# Patient Record
Sex: Male | Born: 1960 | Race: White | Hispanic: No | Marital: Married | Smoking: Former smoker
Health system: Southern US, Community
[De-identification: ages and names within clinical notes are randomized; demographics above are authoritative.]

## PROBLEM LIST (undated history)

## (undated) DIAGNOSIS — E7800 Pure hypercholesterolemia, unspecified: Secondary | ICD-10-CM

## (undated) DIAGNOSIS — F419 Anxiety disorder, unspecified: Secondary | ICD-10-CM

## (undated) DIAGNOSIS — E119 Type 2 diabetes mellitus without complications: Secondary | ICD-10-CM

## (undated) HISTORY — DX: Anxiety disorder, unspecified: F41.9

## (undated) HISTORY — PX: OTHER SURGICAL HISTORY: SHX169

## (undated) HISTORY — DX: Pure hypercholesterolemia, unspecified: E78.00

---
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
## 2002-08-15 ENCOUNTER — Emergency Department (HOSPITAL_COMMUNITY): Admission: EM | Admit: 2002-08-15 | Discharge: 2002-08-15 | Payer: Self-pay | Admitting: Emergency Medicine

## 2002-08-15 ENCOUNTER — Encounter: Payer: Self-pay | Admitting: Emergency Medicine

## 2012-06-07 ENCOUNTER — Telehealth: Payer: Self-pay | Admitting: Pulmonary Disease

## 2012-06-07 NOTE — Telephone Encounter (Signed)
Will hold message and ask sn on Monday---

## 2012-06-08 NOTE — Telephone Encounter (Signed)
Per SN: ok for cpx.  Have him bring any records he may have from other offices.  Thanks.

## 2012-06-08 NOTE — Telephone Encounter (Signed)
Pt's wife [name] returned call.  Advised SN ok with accepting pt.  Per pt's wife, he has not seen another physician since seeing SN "all those years ago."  CPX scheduled for 9.9.13 @ 1130; pt wife aware pt needs to be fasting.  Mrs Boehle asked if a consult sheet is needed - per TD, no.  Paper chart ordered for SN to review.

## 2012-06-08 NOTE — Telephone Encounter (Signed)
Complete phone number to reach spouse is [phone]  Ext [phone]. LMOMTCB x 1.

## 2012-07-23 ENCOUNTER — Encounter: Payer: Self-pay | Admitting: Pulmonary Disease

## 2013-01-14 ENCOUNTER — Encounter: Payer: Self-pay | Admitting: Pulmonary Disease

## 2013-01-28 ENCOUNTER — Ambulatory Visit (INDEPENDENT_AMBULATORY_CARE_PROVIDER_SITE_OTHER): Payer: BC Managed Care – PPO | Admitting: Pulmonary Disease

## 2013-01-28 ENCOUNTER — Other Ambulatory Visit (INDEPENDENT_AMBULATORY_CARE_PROVIDER_SITE_OTHER): Payer: BC Managed Care – PPO

## 2013-01-28 ENCOUNTER — Ambulatory Visit (INDEPENDENT_AMBULATORY_CARE_PROVIDER_SITE_OTHER)
Admission: RE | Admit: 2013-01-28 | Discharge: 2013-01-28 | Disposition: A | Discharge status: Home / Self Care | Payer: BC Managed Care – PPO | Source: Ambulatory Visit | Attending: Pulmonary Disease | Admitting: Pulmonary Disease

## 2013-01-28 ENCOUNTER — Encounter: Payer: Self-pay | Admitting: Pulmonary Disease

## 2013-01-28 VITALS — BP 130/92 | HR 97 | Temp 97.9°F | Ht 69.0 in | Wt 261.4 lb

## 2013-01-28 DIAGNOSIS — M65979 Unspecified synovitis and tenosynovitis, unspecified ankle and foot: Secondary | ICD-10-CM

## 2013-01-28 DIAGNOSIS — M7750 Other enthesopathy of unspecified foot: Secondary | ICD-10-CM

## 2013-01-28 DIAGNOSIS — Z0000 Encounter for general adult medical examination without abnormal findings: Secondary | ICD-10-CM

## 2013-01-28 DIAGNOSIS — M659 Synovitis and tenosynovitis, unspecified: Secondary | ICD-10-CM

## 2013-01-28 DIAGNOSIS — E663 Overweight: Secondary | ICD-10-CM

## 2013-01-28 DIAGNOSIS — Z87442 Personal history of urinary calculi: Secondary | ICD-10-CM | POA: Insufficient documentation

## 2013-01-28 LAB — HEPATIC FUNCTION PANEL
ALT: 65 U/L — ABNORMAL HIGH (ref 0–53)
Albumin: 4.3 g/dL (ref 3.5–5.2)
Alkaline Phosphatase: 69 U/L (ref 39–117)
Bilirubin, Direct: 0.1 mg/dL (ref 0.0–0.3)
Total Protein: 7 g/dL (ref 6.0–8.3)

## 2013-01-28 LAB — URINALYSIS
Nitrite: NEGATIVE
Specific Gravity, Urine: 1.015 (ref 1.000–1.030)
Total Protein, Urine: NEGATIVE
Urine Glucose: NEGATIVE
pH: 7 (ref 5.0–8.0)

## 2013-01-28 LAB — CBC WITH DIFFERENTIAL/PLATELET
Basophils Absolute: 0 10*3/uL (ref 0.0–0.1)
Eosinophils Absolute: 0.2 10*3/uL (ref 0.0–0.7)
Lymphocytes Relative: 32.9 % (ref 12.0–46.0)
MCHC: 33.4 g/dL (ref 30.0–36.0)
Monocytes Relative: 8.6 % (ref 3.0–12.0)
Neutrophils Relative %: 56 % (ref 43.0–77.0)
RBC: 4.93 Mil/uL (ref 4.22–5.81)
RDW: 13.9 % (ref 11.5–14.6)

## 2013-01-28 LAB — TSH: TSH: 0.72 u[IU]/mL (ref 0.35–5.50)

## 2013-01-28 LAB — BASIC METABOLIC PANEL
CO2: 27 mEq/L (ref 19–32)
Calcium: 9.7 mg/dL (ref 8.4–10.5)
Chloride: 100 mEq/L (ref 96–112)
Creatinine, Ser: 0.8 mg/dL (ref 0.4–1.5)
Glucose, Bld: 104 mg/dL — ABNORMAL HIGH (ref 70–99)

## 2013-01-28 LAB — LIPID PANEL
Cholesterol: 230 mg/dL — ABNORMAL HIGH (ref 0–200)
Total CHOL/HDL Ratio: 5
Triglycerides: 146 mg/dL (ref 0.0–149.0)

## 2013-01-28 LAB — PSA: PSA: 0.87 ng/mL (ref 0.10–4.00)

## 2013-01-28 LAB — LDL CHOLESTEROL, DIRECT: Direct LDL: 153.4 mg/dL

## 2013-01-28 NOTE — Patient Instructions (Addendum)
Jacob Acosta, it was nice seeing you again...  Today we did your baseline CXR, EKG, & FASTING blood work...    We will contact you w/ the results when available...   Let's get on track w/ our diet & exercise plan...    The goal is to lose that 1st 15-20 lbs!!!  Call for any questions...  Let's plan a brief follow up visit in 6 months to check your progress.Marland KitchenMarland Kitchen

## 2013-01-28 NOTE — Progress Notes (Signed)
Subjective:     Patient ID: Jacob Acosta, male   DOB: Sep 07, 1961, 52 y.o.   MRN: [id number]  HPI 52 y/o WM, brother of Jacob Acosta, here to establish general medical care & CPX...  ~  January 28, 2013:  Jacob Acosta is 52 y/o & has not seen a physician in >21yrs;  He is the bro of Jacob Acosta who used to work in our office/ CVTS office/ etc & passed away last yr w/ a sudden MI/ arrhythmia;  Jacob Acosta denies medical problems & is here for a CPX, initial assessment & problem list formulation: CXR 3/14 showed normal heart size, clear lungs, WNL/ NAD.Marland KitchenMarland Kitchen EKG 3/14 showed NSR, rate71, WNL, NAD... LABS 3/14:  FLP- not at goals on diet alone- start Simva40;  Chems- ok x BS=104 & SGPT=65 (Rec- low carb, low fat);  CBC- wnl;  TSH=0.72;  PSA=0.87;  UA- clear.          PROBLEM LIST:    PAROTID HYPERTROPHY >> he has bilat parotid hypertrophy on exam; denies prob w/ dry mouth, never had stone in ducts, etc; Rec to use sialogogues, lozenges, etc.  FamHx Heart Disease >> as noted his sister died suddenly at age 25 from a cardiac arrhythmia, no known cardiac dis prior to that; Father died at 18 of pulm embolism after pancreatic surg; Mother died at 49 w/ Alzheimer's disease...  HYPERLIPIDEMIA >> rec to start SIMVASTATIN 40mg /d... ~  FLP 3/14 showed TChol 230, TG 146, HDL 43, LDL 154... We discussed diet, exercise, wt reduction, & start Simva40.  OBESITY >> we discussed low carb. Low fat diet 7 weight reduction strategies... ~  Mar 28, 1999:  He states he weighed ~200# in the late 1990's tp 03/28/1999... ~  03/27/2009:  He notes that he weighed around 230-245# in the late 2000's to 2009/03/27... ~  3/14:  He weighs 261#, height= 69 inches, BMI= 39; we reviewed diet/ exercise/ wt reduction strategies... NOTE> labs w/ elev SGPT=65 & advised no hepatotoxins & get wt down...  Hx KIDNEY STONES >> He reports an episode in 2002/03/27 diagnosed as a kidney stone, which he was able to pass on his own & there has not been any recurrence...  TENDONITIS >> He  reports tendonitis in his left foot & ankle that he treats w/ Aleve prn; he prev saw Ortho at the [facility] clinic, was given a boot but he only used this for a short time, and has not had a repeat eval...   History reviewed. No pertinent past medical history.   Past Surgical History  Procedure Laterality Date  . Tonsillectomy  1980  . Dental sugery      at age 37  He states that he had all 4 wisdom teeth removed 27-Mar-1981...    History reviewed. No pertinent family history. Father died age 72 from a pulm embolism that occurred after pancreas surg (Hx Etoh, pancreatitis)... Mother died age 81 w/ alzheimer's dis; she had hx dermatomyositis eval & rx at [facility]; ?high cholesterol but no known heart disease... One sibling- sister, Jacob Acosta, died suddenly in 03/27/2012 at age 42 from a cardiac arrhythmia- likely underlying coronary dis but unknown...   History   Social History  . Marital Status: Married    Spouse Name: Jacob Acosta    Number of Children: 0  . Years of Education: N/A   Occupational History  . drives delivery truck    Social History Main Topics  . Smoking status: Light Tobacco Smoker  Types: Cigarettes  . Smokeless tobacco: Not on file     Comment: only smoked occasionally  . Alcohol Use: No  . Drug Use: No  . Sexually Active: Not on file   Other Topics Concern  . Not on file   Social History Narrative  . No narrative on file  Never a regular smoker> none x 10 yrs he says... Etoh> Quit ~12 yrs ago   Outpatient Encounter Prescriptions as of 01/28/2013  Medication Sig Dispense Refill  . aspirin 81 MG tablet Take 81 mg by mouth daily.      . naproxen sodium (ANAPROX) 220 MG tablet Take 220 mg by mouth 2 (two) times daily with a meal.       No facility-administered encounter medications on file as of 01/28/2013.    Allergies  Allergen Reactions  . Codeine     Causes him to feel very anxious    Review of Systems    Constitutional:  Denies F/C/S,  anorexia, unexpected weight change. HEENT:  No HA, visual changes, earache, nasal symptoms, sore throat, hoarseness. Resp:  No cough, sputum, hemoptysis; no SOB, tightness, wheezing. Cardio:  No CP, palpit, DOE, orthopnea, edema. GI:  Denies N/V/D/C or blood in stool; no reflux, abd pain, distention, or gas. GU:  No dysuria, freq, urgency, hematuria, or flank pain. MS:  Occas left ankle discomfort; no swelling, tenderness, or decr ROM; no neck pain, back pain, etc. Neuro:  No tremors, seizures, dizziness, syncope, weakness, numbness, gait abn. Skin:  No suspicious lesions or skin rash. Heme:  No adenopathy, bruising, bleeding. Psyche: Denies confusion, sleep disturbance, hallucinations, anxiety, depression.   Objective:   Physical Exam    Vital Signs:  Reviewed...  General:  WD, Overweight, 52 y/o WM in NAD; alert & oriented; pleasant & cooperative... HEENT:  [place]/AT; Conjunctiva- pink, Sclera- nonicteric, EOM-wnl, PERRLA, Fundi-benign; EACs-clear, TMs-wnl; NOSE-clear; THROAT-clear & wnl; +Bilat parotid hypertrophy. Neck:  Supple w/ full ROM; no JVD; normal carotid impulses w/o bruits; no thyromegaly or nodules palpated; no lymphadenopathy. Chest:  Clear to P & A; without wheezes, rales, or rhonchi heard. Heart:  Regular Rhythm; norm S1 & S2 without murmurs, rubs, or gallops detected. Abdomen:  Soft & nontender- no guarding or rebound; normal bowel sounds; no organomegaly or masses palpated. Ext:  Normal ROM; without deformities or arthritic changes; no varicose veins, venous insuffic, or edema;  Pulses intact w/o bruits. Neuro:  CNs II-XII intact; motor testing normal; sensory testing normal; gait normal & balance OK. Derm:  No lesions noted; no rash etc. Lymph:  No cervical, supraclavicular, axillary, or inguinal adenopathy palpated.   Assessment:     Physical Exam >>  Hyperlipidemia>  1st FLP is off & needs meds w/ the hope of coming off if he loses the weight; Rec- start Simva40 &  recheck labs.  Obesity>  We reviewed diet, exercise, wt reduction strategies...  FamHx heart dis in sister>  We reviewed Risk Factors and risk factor reduction strategy...  Hx kidney stone>  Occurred years ago 7 no known recurrence; advised on fluid intake   Hx tendonitis>  He uses OTC Aleve w/ relief, nothing further required at this point...  Parotid hypertrophy>  He is asymptomatic, advised on use of sialogogues, lozenges, etc...     Plan:     Patient's Medications  New Prescriptions  SIMVASTATIN 40mg  - take one tab po Qhs...  Previous Medications   ASPIRIN 81 MG TABLET    Take 81 mg by mouth daily.   NAPROXEN SODIUM (ANAPROX) 220 MG TABLET    Take 220 mg by mouth 2 (two) times daily with a meal.  Modified Medications   No medications on file  Discontinued Medications   No medications on file

## 2013-01-30 ENCOUNTER — Other Ambulatory Visit: Payer: Self-pay | Admitting: Pulmonary Disease

## 2013-01-30 DIAGNOSIS — E7800 Pure hypercholesterolemia, unspecified: Secondary | ICD-10-CM

## 2013-01-30 MED ORDER — SIMVASTATIN 40 MG PO TABS: 40.0000 mg | ORAL_TABLET | Freq: Every day | ORAL | Status: DC

## 2013-08-02 ENCOUNTER — Encounter: Payer: Self-pay | Admitting: Pulmonary Disease

## 2013-08-02 ENCOUNTER — Ambulatory Visit: Payer: BC Managed Care – PPO | Admitting: Pulmonary Disease

## 2013-08-02 NOTE — Progress Notes (Deleted)
Subjective:     Patient ID: Jacob Acosta, male   DOB: 07-16-1961, 53 y.o.   MRN: [id number]  HPI  29 y/o WM, brother of Jacob Acosta, here to establish general medical care & CPX...  ~  January 28, 2013:  Jacob Acosta is 52 y/o & has not seen a physician in >54yrs;  He is the bro of Jacob Acosta who used to work in our office/ CVTS office/ etc & passed away last yr w/ a sudden MI/ arrhythmia;  Jacob Acosta denies medical problems & is here for a CPX, initial assessment & problem list formulation: CXR 3/14 showed normal heart size, clear lungs, WNL/ NAD.Marland KitchenMarland Kitchen EKG 3/14 showed NSR, rate71, WNL, NAD... LABS 3/14:  FLP- not at goals on diet alone- start Simva40;  Chems- ok x BS=104 & SGPT=65 (Rec- low carb, low fat);  CBC- wnl;  TSH=0.72;  PSA=0.87;  UA- clear.   ~  August 02, 2013:  89mo ROV           PROBLEM LIST:    PAROTID HYPERTROPHY >> he has bilat parotid hypertrophy on exam; denies prob w/ dry mouth, never had stone in ducts, etc; Rec to use sialogogues, lozenges, etc.  FamHx Heart Disease >> as noted his sister died suddenly at age 15 from a cardiac arrhythmia, no known cardiac dis prior to that; Father died at 14 of pulm embolism after pancreatic surg; Mother died at 20 w/ Alzheimer's disease...  HYPERLIPIDEMIA >> rec to start SIMVASTATIN 40mg /d... ~  FLP 3/14 showed TChol 230, TG 146, HDL 43, LDL 154... We discussed diet, exercise, wt reduction, & start Simva40.  OBESITY >> we discussed low carb. Low fat diet 7 weight reduction strategies... ~  04/02/99:  He states he weighed ~200# in the late 1990's tp Apr 02, 1999... ~  04/01/2009:  He notes that he weighed around 230-245# in the late 2000's to Apr 01, 2009... ~  3/14:  He weighs 261#, height= 69 inches, BMI= 39; we reviewed diet/ exercise/ wt reduction strategies... NOTE> labs w/ elev SGPT=65 & advised no hepatotoxins & get wt down...  Hx KIDNEY STONES >> He reports an episode in Apr 01, 2002 diagnosed as a kidney stone, which he was able to pass on his own & there has not been  any recurrence...  TENDONITIS >> He reports tendonitis in his left foot & ankle that he treats w/ Aleve prn; he prev saw Ortho at the [facility] clinic, was given a boot but he only used this for a short time, and has not had a repeat eval...   History reviewed. No pertinent past medical history.   Past Surgical History  Procedure Laterality Date  . Tonsillectomy  1980  . Dental sugery      at age 74  He states that he had all 4 wisdom teeth removed 1981/04/01...    History reviewed. No pertinent family history. Father died age 13 from a pulm embolism that occurred after pancreas surg (Hx Etoh, pancreatitis)... Mother died age 28 w/ alzheimer's dis; she had hx dermatomyositis eval & rx at [facility]; ?high cholesterol but no known heart disease... One sibling- sister, Jacob Acosta, died suddenly in 01-Apr-2012 at age 42 from a cardiac arrhythmia- likely underlying coronary dis but unknown...   History   Social History  . Marital Status: Married    Spouse Name: Jacob Acosta    Number of Children: 0  . Years of Education: N/A   Occupational History  . drives delivery truck    Social History  Main Topics  . Smoking status: Light Tobacco Smoker    Types: Cigarettes  . Smokeless tobacco: Not on file     Comment: only smoked occasionally  . Alcohol Use: No  . Drug Use: No  . Sexual Activity: Not on file   Other Topics Concern  . Not on file   Social History Narrative  . No narrative on file  Never a regular smoker> none x 10 yrs he says... Etoh> Quit ~12 yrs ago   Outpatient Encounter Prescriptions as of 08/02/2013  Medication Sig Dispense Refill  . aspirin 81 MG tablet Take 81 mg by mouth daily.      . naproxen sodium (ANAPROX) 220 MG tablet Take 220 mg by mouth 2 (two) times daily with a meal.      . simvastatin (ZOCOR) 40 MG tablet Take 1 tablet (40 mg total) by mouth at bedtime.  90 tablet  3   No facility-administered encounter medications on file as of 08/02/2013.     Allergies  Allergen Reactions  . Codeine     Causes him to feel very anxious    Review of Systems    Constitutional:  Denies F/C/S, anorexia, unexpected weight change. HEENT:  No HA, visual changes, earache, nasal symptoms, sore throat, hoarseness. Resp:  No cough, sputum, hemoptysis; no SOB, tightness, wheezing. Cardio:  No CP, palpit, DOE, orthopnea, edema. GI:  Denies N/V/D/C or blood in stool; no reflux, abd pain, distention, or gas. GU:  No dysuria, freq, urgency, hematuria, or flank pain. MS:  Occas left ankle discomfort; no swelling, tenderness, or decr ROM; no neck pain, back pain, etc. Neuro:  No tremors, seizures, dizziness, syncope, weakness, numbness, gait abn. Skin:  No suspicious lesions or skin rash. Heme:  No adenopathy, bruising, bleeding. Psyche: Denies confusion, sleep disturbance, hallucinations, anxiety, depression.   Objective:   Physical Exam    Vital Signs:  Reviewed...  General:  WD, Overweight, 52 y/o WM in NAD; alert & oriented; pleasant & cooperative... HEENT:  [place]/AT; Conjunctiva- pink, Sclera- nonicteric, EOM-wnl, PERRLA, Fundi-benign; EACs-clear, TMs-wnl; NOSE-clear; THROAT-clear & wnl; +Bilat parotid hypertrophy. Neck:  Supple w/ full ROM; no JVD; normal carotid impulses w/o bruits; no thyromegaly or nodules palpated; no lymphadenopathy. Chest:  Clear to P & A; without wheezes, rales, or rhonchi heard. Heart:  Regular Rhythm; norm S1 & S2 without murmurs, rubs, or gallops detected. Abdomen:  Soft & nontender- no guarding or rebound; normal bowel sounds; no organomegaly or masses palpated. Ext:  Normal ROM; without deformities or arthritic changes; no varicose veins, venous insuffic, or edema;  Pulses intact w/o bruits. Neuro:  CNs II-XII intact; motor testing normal; sensory testing normal; gait normal & balance OK. Derm:  No lesions noted; no rash etc. Lymph:  No cervical, supraclavicular, axillary, or inguinal adenopathy  palpated.   Assessment:     Physical Exam >>  Hyperlipidemia>  1st FLP is off & needs meds w/ the hope of coming off if he loses the weight; Rec- start Simva40 & recheck labs.  Obesity>  We reviewed diet, exercise, wt reduction strategies...  FamHx heart dis in sister>  We reviewed Risk Factors and risk factor reduction strategy...  Hx kidney stone>  Occurred years ago 7 no known recurrence; advised on fluid intake   Hx tendonitis>  He uses OTC Aleve w/ relief, nothing further required at this point...  Parotid hypertrophy>  He is asymptomatic, advised on use of sialogogues, lozenges, etc...     Plan:

## 2013-08-07 ENCOUNTER — Encounter: Payer: Self-pay | Admitting: Pulmonary Disease

## 2014-03-11 IMAGING — CR DG CHEST 2V
2 series · 2 of 2 positions shown · non-contrast
Comparison: None.

CLINICAL DATA: Physical examination.

CHEST - 2 VIEW

[view not recorded (1 of 2)]
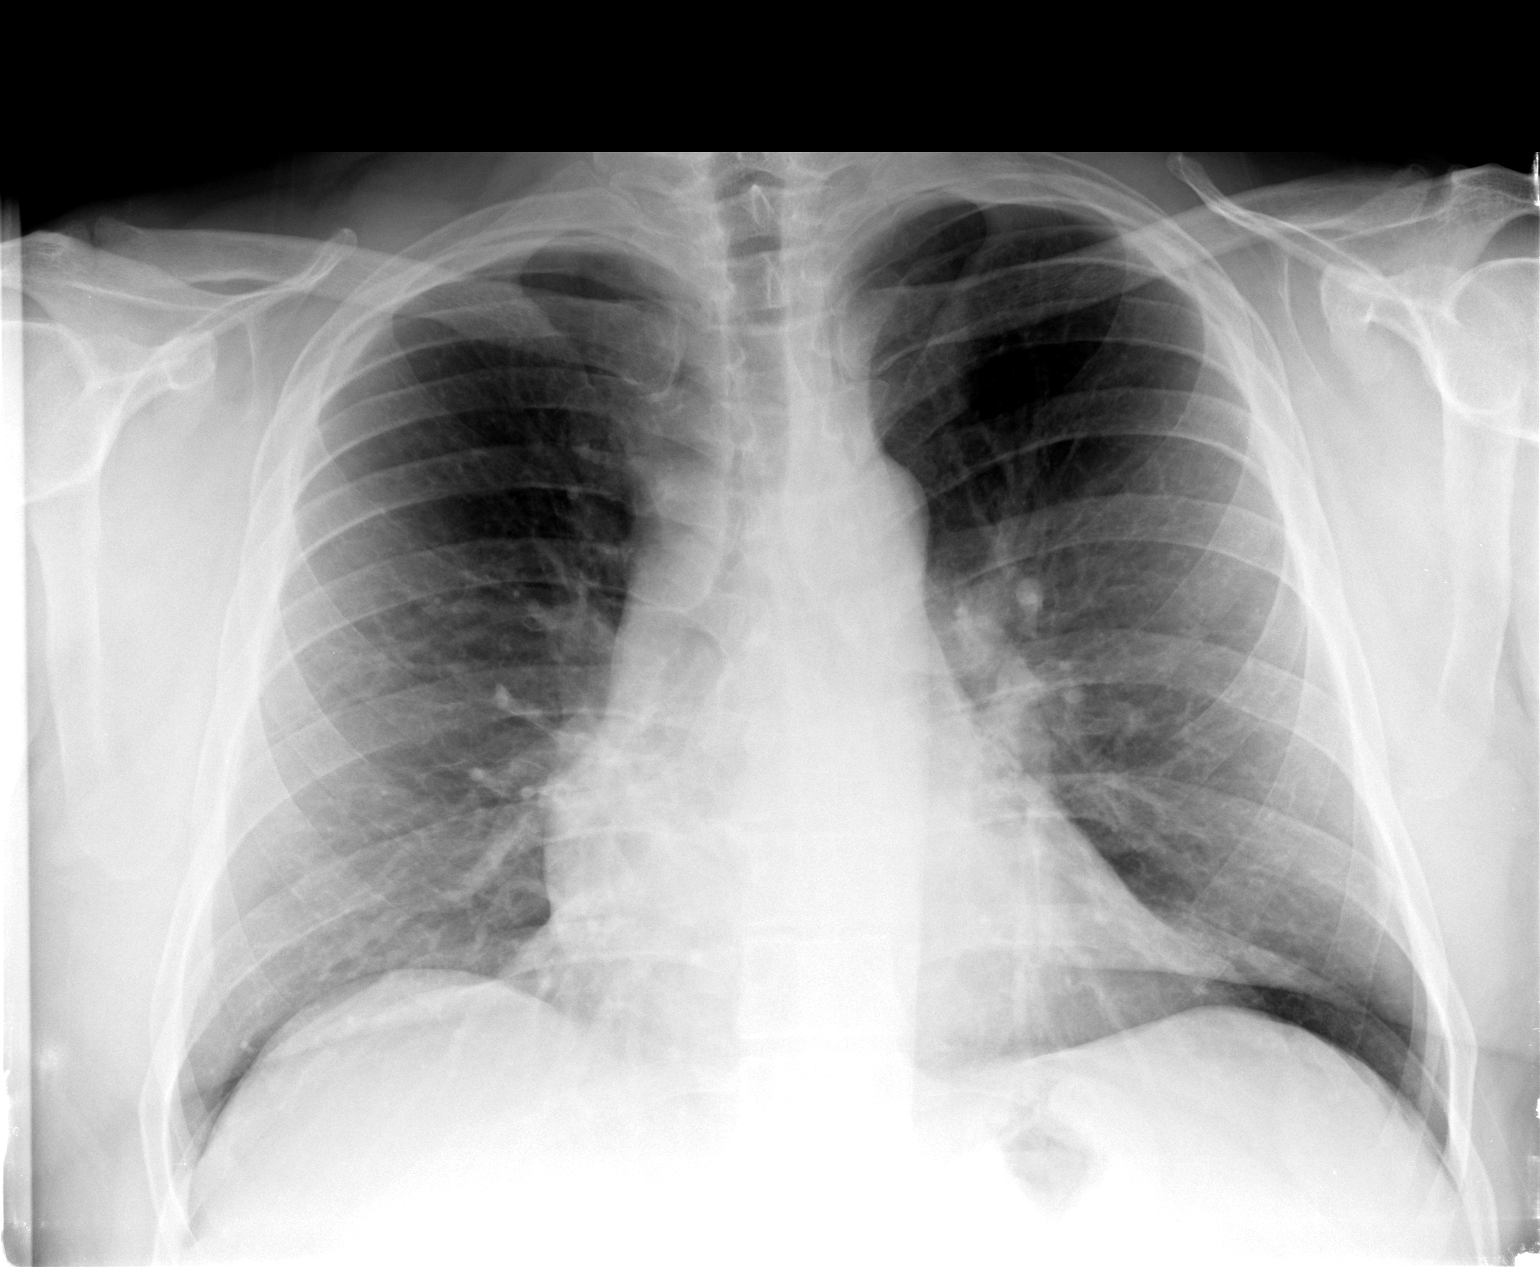

[view not recorded (2 of 2)]
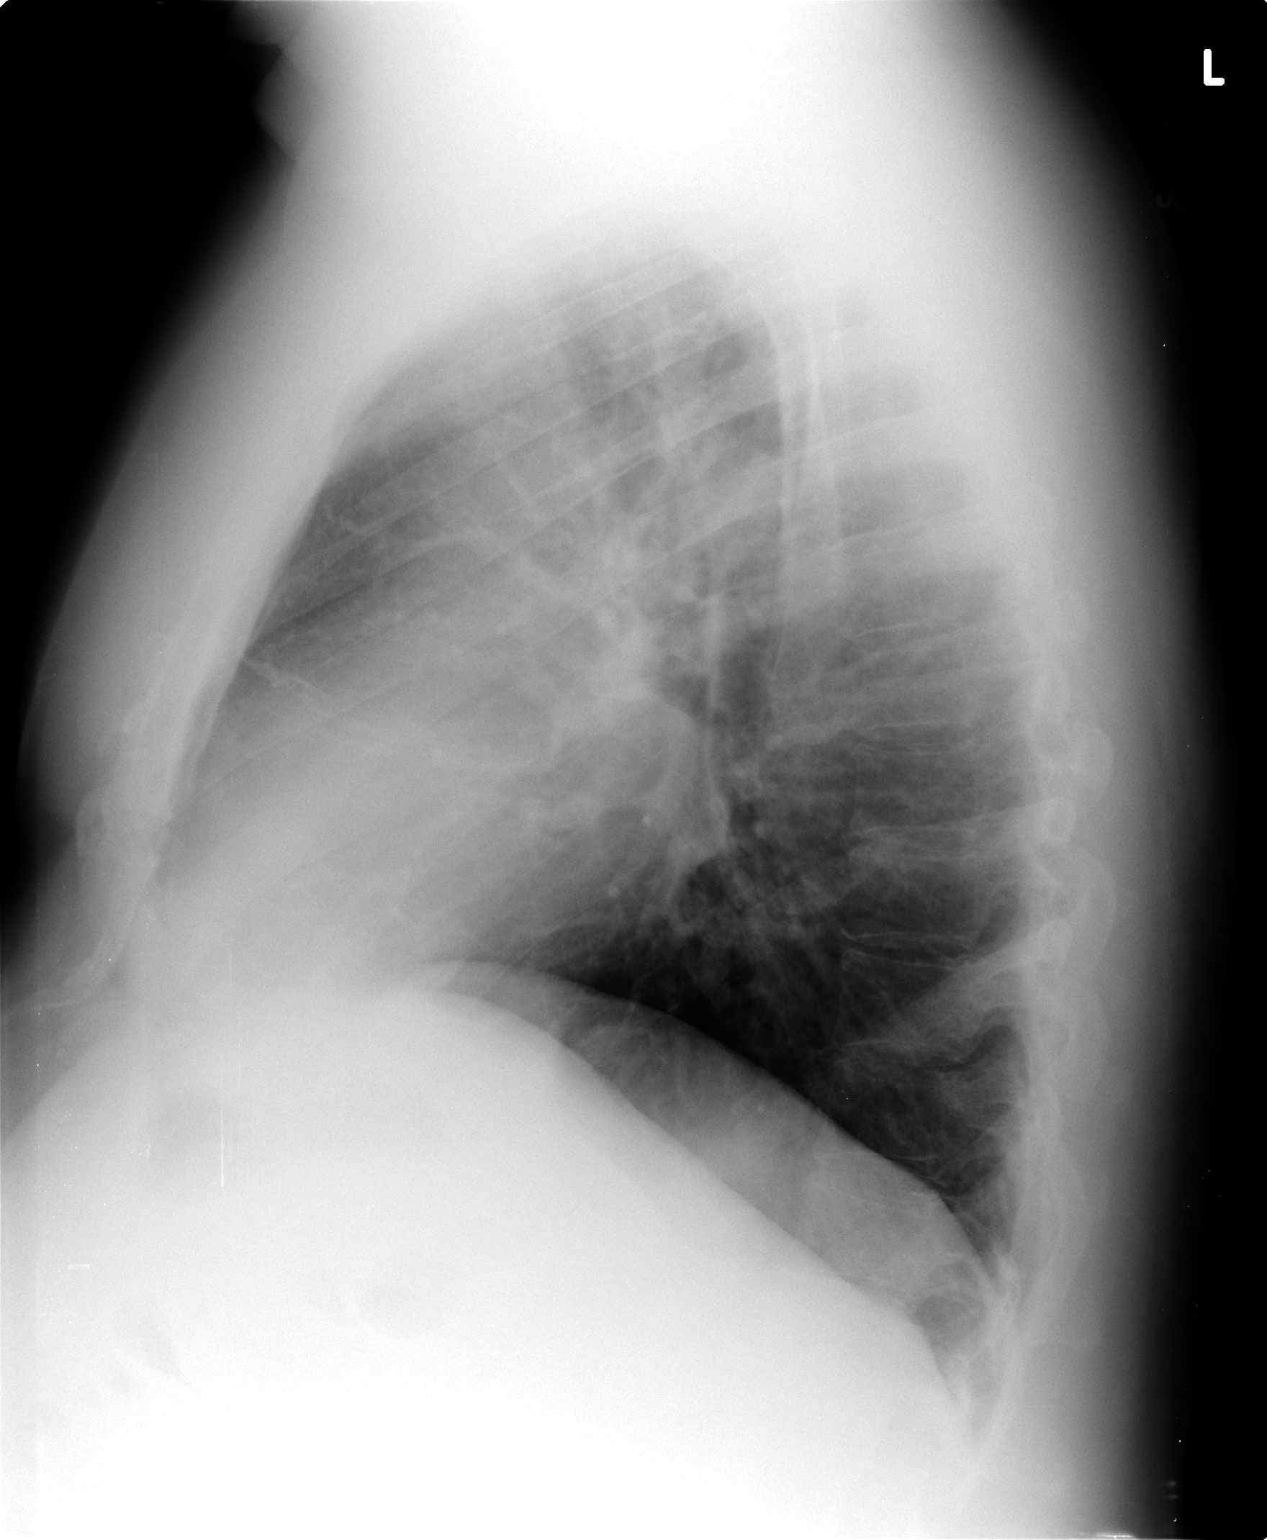

[2 of 2 positions shown; findings below may reference images not displayed]

FINDINGS: Lungs are clear.  Heart size is normal.  No pneumothorax
or pleural effusion.
IMPRESSION: No acute disease.

## 2014-09-29 ENCOUNTER — Ambulatory Visit (HOSPITAL_COMMUNITY): Payer: Self-pay | Admitting: Psychology

## 2014-10-28 ENCOUNTER — Encounter (HOSPITAL_COMMUNITY): Payer: Self-pay | Admitting: Psychology

## 2014-10-28 ENCOUNTER — Ambulatory Visit (INDEPENDENT_AMBULATORY_CARE_PROVIDER_SITE_OTHER): Payer: BC Managed Care – PPO | Admitting: Psychology

## 2014-10-28 DIAGNOSIS — F411 Generalized anxiety disorder: Secondary | ICD-10-CM

## 2014-10-28 NOTE — Progress Notes (Signed)
Patient:   Jacob Acosta   DOB:   03-12-1961  MR Number:  [id number]  [facility]  [facility] [address] [place] [address] Dept: [phone]           Date of Service:   10/28/2014  Start Time:   9 AM End Time:   10:10 AM  Provider/Observer:  Hershal CoriaJohn R Ioannis Schuh PSYD       Billing Code/Service: [id number]  Chief Complaint:     Chief Complaint  Patient presents with  . Anxiety    Reason for Service:  The patient is a self referred 53 year old male who presents reporting Anxiety, dperessive symptoms and excessive worry.  He reports that he has been trying to cope with the sudden death of his sister from a heart attack and the death from his mother at the same time from dementia. The patient reports that this excessive anxiety and worry is causing some difficulties with his cognitive functioning including short-term memory issues that are having a negative impact on his job. The patient reports that this is been going on for about a year but is progressively worsening.  Current Status:  The patient describes moderate to significant symptoms of depression, anxiety, mood changes, appetite disturbance, sleep changes, work issues, insomnia, cognitive issues, loss of interest, excessive worrying, panic attacks, and obsessive thinking.  Reliability of Information: Information is provided by the patient as well as review of available medical records.  Behavioral Observation: Jacob LeschJoe D Nawaz  presents as a 53 y.o.-year-old Right Caucasian Male who appeared his stated age. his dress was Appropriate and he was Well Groomed and his manners were Appropriate to the situation.  There were not any physical disabilities noted.  he displayed an appropriate level of cooperation and motivation.    Interactions:    Active   Attention:   within normal limits  Memory:   within normal limits  Visuo-spatial:   within  normal limits  Speech (Volume):  normal  Speech:   normal pitch  Thought Process:  Coherent  Though Content:  WNL  Orientation:   person, place, time/date and situation  Judgment:   Good  Planning:   Good  Affect:    Anxious  Mood:    Anxious and Depressed  Insight:   Good  Intelligence:   normal  Marital Status/Living: The [place] and [place] up in [place]. He currently lives with his wife. They have no children. The patient's only sibling recently died from a sudden heart attack he spends his leisure time cycling and working out.  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 He has stressed at this job because it is a job that requires a great deal of responsibility and diligence. 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  Past Employment:  The patient has been working in various types of driving a delivery operations.  Substance Use:  No concerns of substance abuse are reported.  the patient denies any substance abuse and does not drink alcohol or use any types of drugs.  Education:   HS Graduate  Medical History:  History reviewed. No pertinent past medical history.      Outpatient Encounter Prescriptions as of 10/28/2014  Medication Sig  . aspirin 81 MG tablet Take 81 mg by  mouth daily.  . naproxen sodium (ANAPROX) 220 MG tablet Take 220 mg by mouth 2 (two) times daily with a meal.  . simvastatin (ZOCOR) 40 MG tablet Take 1 tablet (40 mg total) by mouth at bedtime.          Sexual History:   History  Sexual Activity  . Sexual Activity: Not on file    Abuse/Trauma History: The patient denies any history of abuse or trauma.  Psychiatric History:  Patient denies prior psychiatric history but has been dealing with anxiety and depressive symptoms for over a year now.  Family Med/Psych  History: History reviewed. No pertinent family history.  Risk of Suicide/Violence: virtually non-existent the patient denies any suicidal or homicidal ideation  Impression/DX:  The patient reports increasing symptoms of anxiety and worry. He describes intrusive thinking that center around the loss of his mother and sister but acknowledges that stressors with his job and in particular the fact that this is a third shift job or worsening symptoms. The patient is concerned that he will make a mistake at work which magnified his anxiety.  Disposition/Plan:  We will set the patient for individual psychotherapy as well as looking at the possibility of psychiatric consultation.  The patient also reports difficulty sleeping and excessive snoring we may want to look at the possibility of sleep apnea has an a potential referral for sleep study.  Diagnosis:    Axis I:  Generalized anxiety disorder    Luman Holway R, PsyD 10/28/2014

## 2014-12-04 ENCOUNTER — Telehealth (HOSPITAL_COMMUNITY): Payer: Self-pay | Admitting: *Deleted

## 2014-12-05 ENCOUNTER — Ambulatory Visit (HOSPITAL_COMMUNITY): Payer: Self-pay | Admitting: Psychology

## 2014-12-10 ENCOUNTER — Ambulatory Visit (INDEPENDENT_AMBULATORY_CARE_PROVIDER_SITE_OTHER): Payer: 59 | Admitting: Psychology

## 2014-12-10 DIAGNOSIS — F411 Generalized anxiety disorder: Secondary | ICD-10-CM | POA: Diagnosis not present

## 2015-01-09 ENCOUNTER — Ambulatory Visit (INDEPENDENT_AMBULATORY_CARE_PROVIDER_SITE_OTHER): Payer: 59 | Admitting: Psychology

## 2015-01-09 DIAGNOSIS — F411 Generalized anxiety disorder: Secondary | ICD-10-CM

## 2015-02-02 ENCOUNTER — Ambulatory Visit (INDEPENDENT_AMBULATORY_CARE_PROVIDER_SITE_OTHER): Payer: 59 | Admitting: Psychology

## 2015-02-02 DIAGNOSIS — F411 Generalized anxiety disorder: Secondary | ICD-10-CM | POA: Diagnosis not present

## 2015-03-09 ENCOUNTER — Ambulatory Visit (INDEPENDENT_AMBULATORY_CARE_PROVIDER_SITE_OTHER): Payer: 59 | Admitting: Psychology

## 2015-03-09 ENCOUNTER — Encounter (HOSPITAL_COMMUNITY): Payer: Self-pay | Admitting: Psychology

## 2015-03-09 DIAGNOSIS — F411 Generalized anxiety disorder: Secondary | ICD-10-CM

## 2015-03-09 NOTE — Progress Notes (Signed)
Patient:  Jacob Acosta   DOB: 08-Nov-1961  MR Number: [id number]  [facility] [address] [place] [address] Dept: [phone]  Start: 10 AM End: 11 AM  Provider/Observer:     Hershal CoriaJohn R Lyris Hitchman PSYD  Chief Complaint:      Chief Complaint  Patient presents with  . Anxiety    Reason For Service:     The patient is a self referred [age] year old male who presents reporting Anxiety, dperessive symptoms and excessive worry. He reports that he has been trying to cope with the sudden death of his sister from a heart attack and the death from his mother at the same time from dementia. The patient reports that this excessive anxiety and worry is causing some difficulties with his cognitive functioning including short-term memory issues that are having a negative impact on his job. The patient reports that this is been going on for about a year but is progressively worsening.  Interventions Strategy:  Cognitive/behavioral psychotherapeutic interventions  Participation Level:   Active  Participation Quality:  Appropriate      Behavioral Observation:  Well Groomed, Alert, and Appropriate.   Current Psychosocial Factors: The patient reports that he is continuing to stress a great deal about the loss of his mother and having some flashback type of experiences relating to these death.  The patient reports that he has been looking at changing jobs as this job is been very stressful to him. He is overwhelmed with anxiety and fear and over checking his [place] and fears that something will happen with regard to the loads that he is carrying.  Content of Session:   Reviewed current symptoms and work on therapeutic interventions around building coping skills and strategies.  Current Status:   The patient reports that his symptoms of anxiety have been improving to some degree and has been actively working  on therapeutic interventions we have been developing regarding systematic desensitization of these traumatic losses.  Patient Progress:   Good  Target Goals:   Target goals include reducing intensity, severity, and duration of his anxiety symptoms and building better coping resources.  Last Reviewed:   12/01/2014  Goals Addressed Today:    Today we worked on reducing issues of anxiety and OCD type symptoms.  Impression/Diagnosis:   The patient reports increasing symptoms of anxiety and worry. He describes intrusive thinking that center around the loss of his mother and sister but acknowledges that stressors with his job and in particular the fact that this is a third shift job or worsening symptoms. The patient is concerned that he will make a mistake at work which magnified his anxiety.  Diagnosis:    Axis I: Generalized anxiety disorder

## 2015-03-09 NOTE — Progress Notes (Signed)
Patient:  Jacob Acosta   DOB: 12-26-1960  MR Number: [id number]  [facility] [address] [place] [id number] Dept: [phone]  Start: 10 AM End: 11 AM  Provider/Observer:     Hershal CoriaJohn R Shyheem Whitham PSYD  Chief Complaint:      Chief Complaint  Patient presents with  . Anxiety  . Stress    Reason For Service:     The patient is a self referred 54 year old male who presents reporting Anxiety, dperessive symptoms and excessive worry. He reports that he has been trying to cope with the sudden death of his sister from a heart attack and the death from his mother at the same time from dementia. The patient reports that this excessive anxiety and worry is causing some difficulties with his cognitive functioning including short-term memory issues that are having a negative impact on his job. The patient reports that this is been going on for about a year but is progressively worsening.  Interventions Strategy:  Cognitive/behavioral psychotherapeutic interventions  Participation Level:   Active  Participation Quality:  Appropriate      Behavioral Observation:  Well Groomed, Alert, and Appropriate.   Current Psychosocial Factors: The patient reports that he has been doing much better and trying to exercise more, eat better, and be around others more.  Still looking for good job fit.  Content of Session:   Reviewed current symptoms and work on therapeutic interventions around building coping skills and strategies.  Current Status:   The patient reports that his symptoms of anxiety have been improving  and has been actively working on therapeutic interventions we have been developing regarding systematic desensitization of these traumatic losses.  Reduced frequency of crying spells thinking about loss of sister to once every 3-4 days and last 10 minutes or so.  Patient Progress:   Good  Target  Goals:   Target goals include reducing intensity, severity, and duration of his anxiety symptoms and building better coping resources.  Last Reviewed:   12/01/2014  Goals Addressed Today:    Today we worked on reducing issues of anxiety and OCD type symptoms.  Impression/Diagnosis:   The patient reports increasing symptoms of anxiety and worry. He describes intrusive thinking that center around the loss of his mother and sister but acknowledges that stressors with his job and in particular the fact that this is a third shift job or worsening symptoms. The patient is concerned that he will make a mistake at work which magnified his anxiety.  Diagnosis:    Axis I: Generalized anxiety disorder    Latrelle Fuston R, PsyD 03/09/2015

## 2015-04-03 ENCOUNTER — Ambulatory Visit (HOSPITAL_COMMUNITY): Payer: Self-pay | Admitting: Psychology

## 2015-04-03 ENCOUNTER — Encounter (HOSPITAL_COMMUNITY): Payer: Self-pay | Admitting: Psychology

## 2015-04-23 NOTE — Progress Notes (Signed)
Patient:  Jacob Acosta   DOB: December 20, 1960  MR Number: [id number]  Location: [facility]IORAL [facility] [address] [place] [address] Dept: [phone]  Start: 10 AM End: 11 AM  Provider/Observer:     [name] PSYD  Chief Complaint:      Chief Complaint  Patient presents with  . Anxiety    Reason For Service:     The patient is a self referred 54 year old male who presents reporting Anxiety, dperessive symptoms and excessive worry. He reports that he has been trying to cope with the sudden death of his sister from a heart attack and the death from his mother at the same time from dementia. The patient reports that this excessive anxiety and worry is causing some difficulties with his cognitive functioning including short-term memory issues that are having a negative impact on his job. The patient reports that this is been going on for about a year but is progressively worsening.  Interventions Strategy:  Cognitive/behavioral psychotherapeutic interventions  Participation Level:   Active  Participation Quality:  Appropriate      Behavioral Observation:  Well Groomed, Alert, and Appropriate.   Current Psychosocial Factors: The patient reports that he is continuing to stress a great deal about the loss of his mother and having some flashback type of experiences relating to these death.  The patient reports that he has been looking at changing jobs as this job is been very stressful to him. He is overwhelmed with anxiety and fear and over checking his Carvell and fears that something will happen with regard to the loads that he is carrying.  Content of Session:   Reviewed current symptoms and work on therapeutic interventions around building coping skills and strategies.  Current Status:   The patient reports that his symptoms of anxiety have been improving to some degree and has been actively working  on therapeutic interventions we have been developing regarding systematic desensitization of these traumatic losses.  Patient Progress:   Good  Target Goals:   Target goals include reducing intensity, severity, and duration of his anxiety symptoms and building better coping resources.  Last Reviewed:   01/01/2015  Goals Addressed Today:    Today we worked on reducing issues of anxiety and OCD type symptoms.  Impression/Diagnosis:   The patient reports increasing symptoms of anxiety and worry. He describes intrusive thinking that center around the loss of his mother and sister but acknowledges that stressors with his job and in particular the fact that this is a third shift job or worsening symptoms. The patient is concerned that he will make a mistake at work which magnified his anxiety.  Diagnosis:    Axis I: Generalized anxiety disorder

## 2015-05-06 ENCOUNTER — Encounter (HOSPITAL_COMMUNITY): Payer: Self-pay | Admitting: Psychology

## 2015-05-06 NOTE — Progress Notes (Signed)
Patient:  Jacob Acosta   DOB: [birth year]  MR Number: [id number]  Location: [facility]HAVIORAL [facility] [address] [place] [address] Dept: [phone]  Start: 10 AM End: 11 AM  Provider/Observer:     [name] PSYD  Chief Complaint:      Chief Complaint  Patient presents with  . Anxiety    Reason For Service:     The patient is a self referred 54 year old male who presents reporting Anxiety, dperessive symptoms and excessive worry. He reports that he has been trying to cope with the sudden death of his sister from a heart attack and the death from his mother at the same time from dementia. The patient reports that this excessive anxiety and worry is causing some difficulties with his cognitive functioning including short-term memory issues that are having a negative impact on his job. The patient reports that this is been going on for about a year but is progressively worsening.  Interventions Strategy:  Cognitive/behavioral psychotherapeutic interventions  Participation Level:   Active  Participation Quality:  Appropriate      Behavioral Observation:  Well Groomed, Alert, and Appropriate.   Current Psychosocial Factors: The patient reports that he is continuing to stress a great deal about the loss of his mother and having some flashback type of experiences relating to these death.  The patient reports that he has been looking at changing jobs as this job is been very stressful to him. He is overwhelmed with anxiety and fear and over checking his [place] and fears that something will happen with regard to the loads that he is carrying.  Content of Session:   Reviewed current symptoms and work on therapeutic interventions around building coping skills and strategies.  Current Status:   The patient reports that his symptoms of anxiety have been improving to some degree and has been actively working  on therapeutic interventions we have been developing regarding systematic desensitization of these traumatic losses.  Patient Progress:   Good  Target Goals:   Target goals include reducing intensity, severity, and duration of his anxiety symptoms and building better coping resources.  Last Reviewed:   20/21/2016  Goals Addressed Today:    Today we worked on reducing issues of anxiety and OCD type symptoms.  Impression/Diagnosis:   The patient reports increasing symptoms of anxiety and worry. He describes intrusive thinking that center around the loss of his mother and sister but acknowledges that stressors with his job and in particular the fact that this is a third shift job or worsening symptoms. The patient is concerned that he will make a mistake at work which magnified his anxiety.  Diagnosis:    Axis I: Generalized anxiety disorder

## 2016-05-28 DIAGNOSIS — R300 Dysuria: Secondary | ICD-10-CM | POA: Diagnosis not present

## 2016-05-28 DIAGNOSIS — N410 Acute prostatitis: Secondary | ICD-10-CM | POA: Diagnosis not present

## 2016-07-06 ENCOUNTER — Ambulatory Visit: Payer: Self-pay | Admitting: Urology

## 2016-07-20 DIAGNOSIS — H40033 Anatomical narrow angle, bilateral: Secondary | ICD-10-CM | POA: Diagnosis not present

## 2016-07-20 DIAGNOSIS — E119 Type 2 diabetes mellitus without complications: Secondary | ICD-10-CM | POA: Diagnosis not present

## 2016-07-25 DIAGNOSIS — I10 Essential (primary) hypertension: Secondary | ICD-10-CM | POA: Diagnosis not present

## 2016-07-25 DIAGNOSIS — Z6837 Body mass index (BMI) 37.0-37.9, adult: Secondary | ICD-10-CM | POA: Diagnosis not present

## 2016-07-25 DIAGNOSIS — Z1389 Encounter for screening for other disorder: Secondary | ICD-10-CM | POA: Diagnosis not present

## 2016-07-25 DIAGNOSIS — E1165 Type 2 diabetes mellitus with hyperglycemia: Secondary | ICD-10-CM | POA: Diagnosis not present

## 2016-07-25 DIAGNOSIS — Z0001 Encounter for general adult medical examination with abnormal findings: Secondary | ICD-10-CM | POA: Diagnosis not present

## 2016-08-24 ENCOUNTER — Ambulatory Visit: Payer: Self-pay | Admitting: Urology

## 2016-12-15 DIAGNOSIS — J069 Acute upper respiratory infection, unspecified: Secondary | ICD-10-CM | POA: Diagnosis not present

## 2016-12-15 DIAGNOSIS — J329 Chronic sinusitis, unspecified: Secondary | ICD-10-CM | POA: Diagnosis not present

## 2017-03-08 DIAGNOSIS — R252 Cramp and spasm: Secondary | ICD-10-CM | POA: Diagnosis not present

## 2017-05-25 ENCOUNTER — Ambulatory Visit (INDEPENDENT_AMBULATORY_CARE_PROVIDER_SITE_OTHER): Payer: BLUE CROSS/BLUE SHIELD | Admitting: Cardiovascular Disease

## 2017-05-25 ENCOUNTER — Encounter: Payer: Self-pay | Admitting: Cardiovascular Disease

## 2017-05-25 VITALS — BP 112/70 | HR 83 | Ht 69.0 in | Wt 217.0 lb

## 2017-05-25 DIAGNOSIS — E118 Type 2 diabetes mellitus with unspecified complications: Secondary | ICD-10-CM

## 2017-05-25 DIAGNOSIS — Z9189 Other specified personal risk factors, not elsewhere classified: Secondary | ICD-10-CM | POA: Diagnosis not present

## 2017-05-25 DIAGNOSIS — E669 Obesity, unspecified: Secondary | ICD-10-CM | POA: Diagnosis not present

## 2017-05-25 DIAGNOSIS — Z79899 Other long term (current) drug therapy: Secondary | ICD-10-CM | POA: Diagnosis not present

## 2017-05-25 DIAGNOSIS — Z8249 Family history of ischemic heart disease and other diseases of the circulatory system: Secondary | ICD-10-CM

## 2017-05-25 MED ORDER — ATORVASTATIN CALCIUM 20 MG PO TABS: 20.0000 mg | ORAL_TABLET | Freq: Every day | ORAL | 3 refills | Status: DC

## 2017-05-25 MED ORDER — METFORMIN HCL 500 MG PO TABS: 500.0000 mg | ORAL_TABLET | Freq: Two times a day (BID) | ORAL | 3 refills | Status: DC

## 2017-05-25 NOTE — Patient Instructions (Signed)
Medication Instructions: Dr Royann Shiversroitoru has recommended making the following medication changes: 1. START Metformin 500 mg - take 1 tablet by mouth twice daily 2. START Atorvastatin 20 mg - take 1 tablet by mouth every evening  Labwork: Your physician recommends that you return for lab work in 2-3 months - FASTING.  Testing/Procedures: 1. Exercise Tolerance Test - Your physician has requested that you have an exercise tolerance test. For further information please visit [email or web address] Please also follow instruction sheet, as given. [facility]  Follow-up: Dr Royann Shiversroitoru recommends that you schedule a follow-up appointment in 3 months.  If you need a refill on your cardiac medications before your next appointment, please call your pharmacy.

## 2017-05-25 NOTE — Progress Notes (Signed)
Cardiology Office Note:    Date:  05/25/2017   ID:  Jacob Acosta, DOB [birth year], MRN [id number]  PCP:  Patient, No Pcp Per  Cardiologist:  Thurmon Fair, MD    Referring MD: No ref. provider found   Chief Complaint  Patient presents with  . Follow-up    NP  Palpitations, coronary risk factors  History of Present Illness:    Jacob Acosta is a 56 y.o. male with a hx of Hypercholesterolemia, poorly controlled type 2 diabetes mellitus, mild obesity and a strong family history of premature death from coronary artery disease. He has not been receiving consistent medical care for a few years, after both his mother and sister died in rapid succession. He has had some difficulty adjusting to this emotionally. He has been diagnosed with generalized anxiety disorder.  He presents today mostly at the encouragement of his wife [name]. His last substantive medical evaluation was in 2014 when he saw Dr. Kriste Basque. A stress test was recommended but never performed. Simvastatin was prescribed, but he had unusual side effects. Within 45 minutes of taking a tablet he developed disorientation confusion and stuttering speech area and he has not really taken it since then. He was also prescribed lisinopril but this caused symptoms of hypotension and he stopped. He did fine while taking metformin for diabetes, when he was prescribed Janumet his blood pressure dropped too low. Currently he is not taking any medications for diabetes mellitus. His typical fasting blood sugars around 250.  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 He denies any exertional shortness of breath or chest pain. He will have unusual episodes of sudden severe anxiety associated with palpitations and shortness of breath. Sometimes these wake him from sleep. He has not experienced syncope and denies dizziness. He is able to exercise on a stationary bike and a Bowflex machine without complaints of  shortness of breath or angina. He typically exercises for an hour and 45 minutes every day of the week.  Past Medical History:  Diagnosis Date  . Anxiety   . High cholesterol     Past Surgical History:  Procedure Laterality Date  . dental sugery     at age 61  . TONSILLECTOMY  1980    Current Medications: Current Meds  Medication Sig  . aspirin 81 MG tablet Take 81 mg by mouth daily.  . naproxen sodium (ANAPROX) 220 MG tablet Take 220 mg by mouth 2 (two) times daily with a meal.  . simvastatin (ZOCOR) 40 MG tablet Take 1 tablet (40 mg total) by mouth at bedtime.     Allergies:   Codeine   Social History   Social History  . Marital status: Married    Spouse name: Academic librarian  . Number of children: 0  . Years of education: N/A   Occupational History  . drives delivery truck    Social History Main Topics  . Smoking status: Light Tobacco Smoker    Types: Cigarettes  . Smokeless tobacco: Never Used     Comment: only smoked occasionally  . Alcohol use No  . Drug use: No  . Sexual activity: Not Asked   Other Topics Concern  . None   Social History Narrative  . None     Family History: The patient's family history includes Alcohol abuse in his father; Anxiety disorder in his mother; Depression in his father. ROS:   Please see the history of  present illness.     All other systems reviewed and are negative.  EKGs/Labs/Other Studies Reviewed:     EKG:  EKG is ordered today.  The ekg ordered today demonstrates Sinus rhythm, normal tracing, QTC 420 ms  Recent Labs: No results found for requested labs within last 8760 hours.  Recent Lipid Panel    Component Value Date/Time   CHOL 230 (H) 01/28/2013 1243   TRIG 146.0 01/28/2013 1243   HDL 42.50 01/28/2013 1243   CHOLHDL 5 01/28/2013 1243   VLDL 29.2 01/28/2013 1243   LDLDIRECT 153.4 01/28/2013 1243    Physical Exam:    VS:  BP 112/70   Pulse 83   Ht 5\' 9"  (1.753 m)   Wt 217 lb (98.4 kg)   BMI 32.05  kg/m     Wt Readings from Last 3 Encounters:  05/25/17 217 lb (98.4 kg)  01/28/13 261 lb 6.4 oz (118.6 kg)     GEN:  Well nourished, well developed in no acute distress, Obese with prominent ventral adiposity HEENT: Normal NECK: No JVD; No carotid bruits LYMPHATICS: No lymphadenopathy CARDIAC: RRR, no murmurs, rubs, gallops RESPIRATORY:  Clear to auscultation without rales, wheezing or rhonchi  ABDOMEN: Soft, non-tender, non-distended MUSCULOSKELETAL:  No edema; No deformity  SKIN: Warm and dry NEUROLOGIC:  Alert and oriented x 3 PSYCHIATRIC:  Normal affect. Appears to be a little high strung and anxious   ASSESSMENT:    1. At risk for cardiovascular event   2. Type 2 diabetes mellitus with complication, without long-term current use of insulin (HCC)   3. Mild obesity   4. Family history of premature coronary heart disease   5. Medication management    PLAN:    In order of problems listed above:  1. EAV:[id number]:Simply because he has diabetes mellitus target LDL less than 100. I don't think he will be able to achieve this without medication. Had some unusual side effects that occurred almost immediately after swallowing a simvastatin tablet. I really doubt that the simvastatin was the cause, but will try a different agent. 2. DM: As a first step, he should restart taking metformin. After achieving steady state I think he would be well suited to take [place]Farxiga or similar agent, as long as financially achievable. Strongly encouraged him to lose weight. Congratulated him on regular exercise. Discussed the glycemic index concept and the need to limit intake of sweets and starches with high glycemic index. 3. Obesity: Discussed the concept of insulin resistance and the vicious cycle of obesity and hyperglycemia. Metformin is a good first choice for his diabetes. Target weight loss down to 200 pounds over the next year. He does not have symptoms suggestive of obstructive sleep apnea. Epworth  Sleepiness Scale is only 4. 4. FHx CAD: He has numerous coronary risk factors, we'll schedule him for a treadmill stress test, which will also help us fine-tune his exercise prescription.   Medication Adjustments/Labs and Tests Ordered: Current medicines are reviewed at length with the patient today.  Concerns regarding medicines are outlined above.  Orders Placed This Encounter  Procedures  . Comprehensive metabolic panel  . Lipid panel  . Hemoglobin A1c  . EXERCISE TOLERANCE TEST   Meds ordered this encounter  Medications  . metFORMIN (GLUCOPHAGE) 500 MG tablet    Sig: Take 1 tablet (500 mg total) by mouth 2 (two) times daily with a meal.    Dispense:  180 tablet    Refill:  3  . atorvastatin (LIPITOR) 20  MG tablet    Sig: Take 1 tablet (20 mg total) by mouth daily.    Dispense:  90 tablet    Refill:  3    Signed, Thurmon Fair, MD  05/25/2017 6:23 PM    [facility]

## 2017-05-26 NOTE — Addendum Note (Signed)
Addended by: Recardo EvangelistWILLIAMSON, Pa Tennant L on: 05/26/2017 11:19 AM   Modules accepted: Orders

## 2017-06-01 ENCOUNTER — Ambulatory Visit (HOSPITAL_COMMUNITY)
Admission: RE | Admit: 2017-06-01 | Discharge: 2017-06-01 | Disposition: A | Discharge status: Home / Self Care | Payer: BLUE CROSS/BLUE SHIELD | Source: Ambulatory Visit | Attending: Cardiovascular Disease | Admitting: Cardiovascular Disease

## 2017-06-01 ENCOUNTER — Other Ambulatory Visit: Payer: Self-pay | Admitting: *Deleted

## 2017-06-01 DIAGNOSIS — R002 Palpitations: Secondary | ICD-10-CM | POA: Diagnosis not present

## 2017-06-01 DIAGNOSIS — Z9189 Other specified personal risk factors, not elsewhere classified: Secondary | ICD-10-CM | POA: Diagnosis not present

## 2017-06-01 LAB — EXERCISE TOLERANCE TEST
CHL RATE OF PERCEIVED EXERTION: 15
CSEPED: 6 min
CSEPEDS: 33 s
CSEPEW: 8.7 METS
MPHR: 163 {beats}/min
Peak HR: 164 {beats}/min
Percent HR: 100 %
Rest HR: 71 {beats}/min

## 2017-06-01 MED ORDER — METOPROLOL SUCCINATE ER 25 MG PO TB24: 25.0000 mg | ORAL_TABLET | Freq: Every day | ORAL | 11 refills | Status: DC

## 2017-07-19 ENCOUNTER — Other Ambulatory Visit: Payer: Self-pay

## 2017-07-19 DIAGNOSIS — E119 Type 2 diabetes mellitus without complications: Secondary | ICD-10-CM

## 2017-07-21 DIAGNOSIS — J209 Acute bronchitis, unspecified: Secondary | ICD-10-CM | POA: Diagnosis not present

## 2017-08-22 ENCOUNTER — Ambulatory Visit: Payer: BLUE CROSS/BLUE SHIELD | Admitting: Cardiovascular Disease

## 2017-08-30 ENCOUNTER — Ambulatory Visit (INDEPENDENT_AMBULATORY_CARE_PROVIDER_SITE_OTHER): Payer: BLUE CROSS/BLUE SHIELD | Admitting: "Endocrinology

## 2017-08-30 ENCOUNTER — Encounter: Payer: Self-pay | Admitting: "Endocrinology

## 2017-08-30 VITALS — BP 131/80 | HR 69 | Ht 69.0 in | Wt 216.0 lb

## 2017-08-30 DIAGNOSIS — E1165 Type 2 diabetes mellitus with hyperglycemia: Secondary | ICD-10-CM

## 2017-08-30 DIAGNOSIS — E782 Mixed hyperlipidemia: Secondary | ICD-10-CM

## 2017-08-30 NOTE — Progress Notes (Signed)
Thanks. His wife works in our office and will enlist her help. MCr

## 2017-08-30 NOTE — Progress Notes (Signed)
Subjective:    Patient ID: Jacob Acosta, male    DOB: [birth year].  he is being seen in consultation for management of currently uncontrolled symptomatic diabetes requested by  Dr. Royann Shivers.  Patient does not have a PCP.   Past Medical History:  Diagnosis Date  . Anxiety   . High cholesterol    Past Surgical History:  Procedure Laterality Date  . dental sugery     at age 56  . TONSILLECTOMY  1980   Social History   Social History  . Marital status: Married    Spouse name: Academic librarian  . Number of children: 0  . Years of education: N/A   Occupational History  . drives delivery truck    Social History Main Topics  . Smoking status: Light Tobacco Smoker    Types: Cigarettes  . Smokeless tobacco: Never Used     Comment: only smoked occasionally  . Alcohol use No  . Drug use: No  . Sexual activity: Not Asked   Other Topics Concern  . None   Social History Narrative  . None   Outpatient Encounter Prescriptions as of 08/30/2017  Medication Sig  . aspirin 81 MG tablet Take 162 mg by mouth daily.   Marland Kitchen atorvastatin (LIPITOR) 20 MG tablet Take 1 tablet (20 mg total) by mouth daily.  . metFORMIN (GLUCOPHAGE) 500 MG tablet Take 1 tablet (500 mg total) by mouth 2 (two) times daily with a meal. (Patient not taking: Reported on 08/30/2017)  . metoprolol succinate (TOPROL XL) 25 MG 24 hr tablet Take 1 tablet (25 mg total) by mouth daily. (Patient not taking: Reported on 08/30/2017)  . naproxen sodium (ANAPROX) 220 MG tablet Take 220 mg by mouth 2 (two) times daily with a meal.  . simvastatin (ZOCOR) 40 MG tablet Take 1 tablet (40 mg total) by mouth at bedtime. (Patient not taking: Reported on 08/30/2017)   No facility-administered encounter medications on file as of 08/30/2017.     ALLERGIES: Allergies  Allergen Reactions  . Ciprofloxacin Hcl   . Codeine     Causes him to feel very anxious  . Penicillins     VACCINATION STATUS: There is no immunization history  for the selected administration types on file for this patient.  Diabetes  He presents for his initial diabetic visit. He has type 2 diabetes mellitus. Onset time: He was diagnosed at approximate age of 13 years. His disease course has been worsening. There are no hypoglycemic associated symptoms. Pertinent negatives for hypoglycemia include no confusion, headaches, pallor or seizures. Associated symptoms include blurred vision, polydipsia and polyuria. Pertinent negatives for diabetes include no chest pain, no fatigue, no polyphagia and no weakness. There are no hypoglycemic complications. Symptoms are worsening. There are no diabetic complications. Risk factors for coronary artery disease include diabetes mellitus, dyslipidemia, family history, male sex, obesity, sedentary lifestyle and tobacco exposure. When asked about current treatments, none were reported. His weight is decreasing steadily. He is following a generally unhealthy diet. When asked about meal planning, he reported none. He has not had a previous visit with a dietitian. He participates in exercise intermittently. (He brought a  meter showing random blood glucose testing averaging more than 200.) An ACE inhibitor/angiotensin II receptor blocker is not being taken. He does not see a podiatrist.Eye exam is not current.  Hyperlipidemia  This is a chronic problem. The current episode started more than 1 year ago. The problem is uncontrolled. Exacerbating diseases include diabetes and  obesity. Pertinent negatives include no chest pain, myalgias or shortness of breath. He is currently on no antihyperlipidemic treatment. Risk factors for coronary artery disease include diabetes mellitus, dyslipidemia, hypertension, a sedentary lifestyle, obesity and family history.  Hypertension  This is a chronic problem. The current episode started more than 1 year ago. Associated symptoms include blurred vision. Pertinent negatives include no chest pain,  headaches, neck pain, palpitations or shortness of breath. Risk factors for coronary artery disease include diabetes mellitus, dyslipidemia, male gender, sedentary lifestyle, smoking/tobacco exposure and obesity.    Review of Systems  Constitutional: Negative for chills, fatigue, fever and unexpected weight change.  HENT: Negative for dental problem, mouth sores and trouble swallowing.   Eyes: Positive for blurred vision. Negative for visual disturbance.  Respiratory: Negative for cough, choking, chest tightness, shortness of breath and wheezing.   Cardiovascular: Negative for chest pain, palpitations and leg swelling.  Gastrointestinal: Negative for abdominal distention, abdominal pain, constipation, diarrhea, nausea and vomiting.  Endocrine: Positive for polydipsia and polyuria. Negative for polyphagia.  Genitourinary: Negative for dysuria, flank pain, hematuria and urgency.  Musculoskeletal: Negative for back pain, gait problem, myalgias and neck pain.  Skin: Negative for pallor, rash and wound.  Neurological: Negative for seizures, syncope, weakness, numbness and headaches.  Psychiatric/Behavioral: Negative for confusion and dysphoric mood.    Objective:    BP 131/80   Pulse 69   Ht 5\' 9"  (1.753 m)   Wt 216 lb (98 kg)   BMI 31.90 kg/m   Wt Readings from Last 3 Encounters:  08/30/17 216 lb (98 kg)  05/25/17 217 lb (98.4 kg)  01/28/13 261 lb 6.4 oz (118.6 kg)     Physical Exam  Constitutional: He is oriented to person, place, and time. He appears well-developed. He is cooperative. No distress.  HENT:  Head: Normocephalic and atraumatic.  Eyes: EOM are normal.  Neck: Normal range of motion. Neck supple. No tracheal deviation present. No thyromegaly present.  Cardiovascular: Normal rate, S1 normal, S2 normal and normal heart sounds.  Exam reveals no gallop.   No murmur heard. Pulses:      Dorsalis pedis pulses are 1+ on the right side, and 1+ on the left side.        Posterior tibial pulses are 1+ on the right side, and 1+ on the left side.  Pulmonary/Chest: Breath sounds normal. No respiratory distress. He has no wheezes.  Abdominal: Soft. Bowel sounds are normal. He exhibits no distension. There is no tenderness. There is no guarding and no CVA tenderness.  Musculoskeletal: He exhibits no edema.       Right shoulder: He exhibits no swelling and no deformity.  Neurological: He is alert and oriented to person, place, and time. He has normal strength and normal reflexes. No cranial nerve deficit or sensory deficit. Gait normal.  Skin: Skin is warm and dry. No rash noted. No cyanosis. Nails show no clubbing.  Psychiatric: He has a normal mood and affect. His speech is normal. Cognition and memory are normal.  Generally skeptical of medications.   CMP ( most recent) CMP     Component Value Date/Time   NA 135 01/28/2013 1243   K 4.3 01/28/2013 1243   CL 100 01/28/2013 1243   CO2 27 01/28/2013 1243   GLUCOSE 104 (H) 01/28/2013 1243   BUN 18 01/28/2013 1243   CREATININE 0.8 01/28/2013 1243   CALCIUM 9.7 01/28/2013 1243   PROT 7.0 01/28/2013 1243   ALBUMIN 4.3 01/28/2013 1243  AST 35 01/28/2013 1243   ALT 65 (H) 01/28/2013 1243   ALKPHOS 69 01/28/2013 1243   BILITOT 0.7 01/28/2013 1243     Lipid Panel ( most recent) Lipid Panel     Component Value Date/Time   CHOL 230 (H) 01/28/2013 1243   TRIG 146.0 01/28/2013 1243   HDL 42.50 01/28/2013 1243   CHOLHDL 5 01/28/2013 1243   VLDL 29.2 01/28/2013 1243   LDLDIRECT 153.4 01/28/2013 1243     Assessment & Plan:   1. Uncontrolled type 2 diabetes mellitus with hyperglycemia (HCC) - Patient has currently uncontrolled symptomatic type 2 DM since  56 years of age. - Patient does not have any recent labs nor A1c to review. He'll be sent for new set of labs.  -his diabetes is complicated by obesity/sedentary  life and Jacob LeschJoe D Acosta remains at a high risk for more acute and chronic complications which  include CAD, CVA, CKD, retinopathy, and neuropathy. These are all discussed in detail with the patient.  - I have counseled him on diet management and weight loss, by adopting a carbohydrate restricted/protein rich diet.  - Suggestion is made for him to avoid simple carbohydrates  from his diet including Cakes, Sweet Desserts, Ice Cream, Soda (diet and regular), Sweet Tea, Candies, Chips, Cookies, Store Bought Juices, Alcohol in Excess of  1-2 drinks a day, Artificial Sweeteners, and "Sugar-free" Products. This will help patient to have stable blood glucose profile and potentially avoid unintended weight gain.  - I encouraged him to switch to  unprocessed or minimally processed complex starch and increased protein intake (animal or plant source), fruits, and vegetables.  - he is advised to stick to a routine mealtimes to eat 3 meals  a day and avoid unnecessary snacks ( to snack only to correct hypoglycemia).   - he will be scheduled with Norm SaltPenny Crumpton, RDN, CDE for individualized DM education.  - I have approached him with the following individualized plan to manage diabetes and patient agrees:   - He is currently not taking any medications for diabetes, is generally skeptical of medications and wishes to minimize medical therapy. - I  will proceed to initiate  strict monitoring of glucose 4 times a day-before meals and at bedtime. -Patient is encouraged to call clinic for blood glucose levels less than 70 or above 300 mg /dl. - Given his presentation with average blood glucose greater than 200, he may soon  require some form of medical therapy. - He has several options of treatment if renal function is normal.  - Patient specific target  A1c;  LDL, HDL, Triglycerides, and  Waist Circumference were discussed in detail.  2) BP/HTN: Controlled. He is not on any medications, wishes to avoid medications. 3) Lipids/HPL:   He says he does not tolerate Zocor.  4)  Weight/Diet: CDE Consult will be  initiated , exercise, and detailed carbohydrates information provided.  5) Chronic Care/Health Maintenance:  -he  Is not  on ACEI/ARB and Statin medications and  is encouraged to continue to follow up with Ophthalmology, Dentist,  Podiatrist at least yearly or according to recommendations, and advised to  stay away from smoking. I have recommended yearly flu vaccine and pneumonia vaccination at least every 5 years; moderate intensity exercise for up to 150 minutes weekly; and  sleep for at least 7 hours a day.  - Time spent with the patient: 1 hour, of which >50% was spent in obtaining information about his symptoms, reviewing his previous labs,  evaluations, and treatments, counseling him about his  urgency uncontrolled type 2 diabetes, hyperlipidemia, hypertension, and developing a plan for long term treatment; he had a number of questions which I addressed.  - Patient to bring meter and  blood glucose logs during his next visit.  - I advised patient to maintain close follow up with a PCP for primary care needs, and Dr. Royann Shivers for cardiology needs.  Follow up plan: - Return in about 1 week (around 09/06/2017) for labs today, meter, and logs.  Marquis Lunch, MD Phone: [phone]  Fax: [phone]   08/30/2017, 4:31 PM This note was partially dictated with voice recognition software. Similar sounding words can be transcribed inadequately or may not  be corrected upon review.

## 2017-08-30 NOTE — Patient Instructions (Signed)

## 2017-08-31 LAB — COMPLETE METABOLIC PANEL WITH GFR
AG RATIO: 1.7 (calc) (ref 1.0–2.5)
ALBUMIN MSPROF: 4.5 g/dL (ref 3.6–5.1)
ALT: 21 U/L (ref 9–46)
AST: 14 U/L (ref 10–35)
Alkaline phosphatase (APISO): 69 U/L (ref 40–115)
BILIRUBIN TOTAL: 0.5 mg/dL (ref 0.2–1.2)
BUN: 15 mg/dL (ref 7–25)
CHLORIDE: 99 mmol/L (ref 98–110)
CO2: 29 mmol/L (ref 20–32)
Calcium: 10.2 mg/dL (ref 8.6–10.3)
Creat: 0.8 mg/dL (ref 0.70–1.33)
GFR, EST AFRICAN AMERICAN: 115 mL/min/{1.73_m2} (ref >=60)
GFR, Est Non African American: 99 mL/min/{1.73_m2} (ref >=60)
GLOBULIN: 2.6 g/dL (ref 1.9–3.7)
Glucose, Bld: 214 mg/dL — ABNORMAL HIGH (ref 65–139)
Potassium: 4.2 mmol/L (ref 3.5–5.3)
SODIUM: 136 mmol/L (ref 135–146)
TOTAL PROTEIN: 7.1 g/dL (ref 6.1–8.1)

## 2017-08-31 LAB — HEMOGLOBIN A1C
Hgb A1c MFr Bld: 10.8 % of total Hgb — ABNORMAL HIGH (ref <5.7)
Mean Plasma Glucose: 263 (calc)
eAG (mmol/L): 14.6 (calc)

## 2017-08-31 LAB — T4, FREE: Free T4: 1.1 ng/dL (ref 0.8–1.8)

## 2017-08-31 LAB — TSH: TSH: 0.83 mIU/L (ref 0.40–4.50)

## 2017-08-31 LAB — MICROALBUMIN / CREATININE URINE RATIO
CREATININE, URINE: 75 mg/dL (ref 20–320)
MICROALB UR: 0.6 mg/dL
MICROALB/CREAT RATIO: 8 ug/mg{creat} (ref <30)

## 2017-08-31 LAB — VITAMIN D 25 HYDROXY (VIT D DEFICIENCY, FRACTURES): Vit D, 25-Hydroxy: 31 ng/mL (ref 30–100)

## 2017-09-06 ENCOUNTER — Encounter: Payer: Self-pay | Admitting: "Endocrinology

## 2017-09-06 ENCOUNTER — Ambulatory Visit (INDEPENDENT_AMBULATORY_CARE_PROVIDER_SITE_OTHER): Payer: BLUE CROSS/BLUE SHIELD | Admitting: "Endocrinology

## 2017-09-06 VITALS — BP 135/79 | HR 76 | Ht 69.0 in | Wt 214.0 lb

## 2017-09-06 DIAGNOSIS — E1165 Type 2 diabetes mellitus with hyperglycemia: Secondary | ICD-10-CM

## 2017-09-06 DIAGNOSIS — E782 Mixed hyperlipidemia: Secondary | ICD-10-CM | POA: Diagnosis not present

## 2017-09-06 MED ORDER — METFORMIN HCL 500 MG PO TABS: 500.0000 mg | ORAL_TABLET | Freq: Two times a day (BID) | ORAL | 3 refills | Status: DC

## 2017-09-06 NOTE — Progress Notes (Signed)
Subjective:    Patient ID: Jacob Acosta, male    DOB: December 14, 1960.  he is being seen in f/u for management of currently uncontrolled symptomatic Uncontrolled type II diabetes requested by  Dr. Royann Shivers.  Patient does not have a PCP.   Past Medical History:  Diagnosis Date  . Anxiety   . High cholesterol    Past Surgical History:  Procedure Laterality Date  . dental sugery     at age 56  . TONSILLECTOMY  1980   Social History   Social History  . Marital status: Married    Spouse name: Academic librarian  . Number of children: 0  . Years of education: N/A   Occupational History  . drives delivery truck    Social History Main Topics  . Smoking status: Light Tobacco Smoker    Types: Cigarettes  . Smokeless tobacco: Never Used     Comment: only smoked occasionally  . Alcohol use No  . Drug use: No  . Sexual activity: Not Asked   Other Topics Concern  . None   Social History Narrative  . None   Outpatient Encounter Prescriptions as of 09/06/2017  Medication Sig  . aspirin 81 MG tablet Take 162 mg by mouth daily.   Marland Kitchen atorvastatin (LIPITOR) 20 MG tablet Take 1 tablet (20 mg total) by mouth daily.  . metFORMIN (GLUCOPHAGE) 500 MG tablet Take 1 tablet (500 mg total) by mouth 2 (two) times daily after a meal.  . metoprolol succinate (TOPROL XL) 25 MG 24 hr tablet Take 1 tablet (25 mg total) by mouth daily. (Patient not taking: Reported on 08/30/2017)  . naproxen sodium (ANAPROX) 220 MG tablet Take 220 mg by mouth 2 (two) times daily with a meal.  . simvastatin (ZOCOR) 40 MG tablet Take 1 tablet (40 mg total) by mouth at bedtime. (Patient not taking: Reported on 08/30/2017)  . [DISCONTINUED] metFORMIN (GLUCOPHAGE) 500 MG tablet Take 1 tablet (500 mg total) by mouth 2 (two) times daily with a meal. (Patient not taking: Reported on 08/30/2017)  . [DISCONTINUED] metFORMIN (GLUCOPHAGE) 500 MG tablet Take 1 tablet (500 mg total) by mouth 2 (two) times daily with a meal.   No  facility-administered encounter medications on file as of 09/06/2017.     ALLERGIES: Allergies  Allergen Reactions  . Ciprofloxacin Hcl   . Codeine     Causes him to feel very anxious  . Penicillins     VACCINATION STATUS: There is no immunization history for the selected administration types on file for this patient.  Diabetes  He presents for f/u  diabetic visit. He has type 2 diabetes mellitus. Onset time: He was diagnosed at approximate age of 65 years. His disease course has been worsening. There are no hypoglycemic associated symptoms. Pertinent negatives for hypoglycemia include no confusion, headaches, pallor or seizures. Associated symptoms include blurred vision, polydipsia and polyuria. Pertinent negatives for diabetes include no chest pain, no fatigue, no polyphagia and no weakness. There are no hypoglycemic complications. Symptoms are worsening. There are no diabetic complications. Risk factors for coronary artery disease include diabetes mellitus, dyslipidemia, family history, male sex, obesity, sedentary lifestyle and tobacco exposure. When asked about current treatments, none were reported. His weight is decreasing steadily. He is following a generally unhealthy diet. When asked about meal planning, he reported none. He has not had a previous visit with a dietitian. He participates in exercise intermittently. (He brought a  meter showing persistently above target blood glucose  profile averaging between 250 and 275 mg/dL . An ACE inhibitor/angiotensin II receptor blocker is not being taken. He does not see a podiatrist.Eye exam is not current.  Hyperlipidemia  This is a chronic problem. The current episode started more than 1 year ago. The problem is uncontrolled. Exacerbating diseases include diabetes and obesity. Pertinent negatives include no chest pain, myalgias or shortness of breath. He is currently on no antihyperlipidemic treatment. Risk factors for coronary artery disease  include diabetes mellitus, dyslipidemia, hypertension, a sedentary lifestyle, obesity and family history.  Hypertension  This is a chronic problem. The current episode started more than 1 year ago. Associated symptoms include blurred vision. Pertinent negatives include no chest pain, headaches, neck pain, palpitations or shortness of breath. Risk factors for coronary artery disease include diabetes mellitus, dyslipidemia, male gender, sedentary lifestyle, smoking/tobacco exposure and obesity.    Review of Systems  Constitutional: Negative for chills, fatigue, fever and unexpected weight change.  HENT: Negative for dental problem, mouth sores and trouble swallowing.   Eyes: Positive for blurred vision. Negative for visual disturbance.  Respiratory: Negative for cough, choking, chest tightness, shortness of breath and wheezing.   Cardiovascular: Negative for chest pain, palpitations and leg swelling.  Gastrointestinal: Negative for abdominal distention, abdominal pain, constipation, diarrhea, nausea and vomiting.  Endocrine: Positive for polydipsia and polyuria. Negative for polyphagia.  Genitourinary: Negative for dysuria, flank pain, hematuria and urgency.  Musculoskeletal: Negative for back pain, gait problem, myalgias and neck pain.  Skin: Negative for pallor, rash and wound.  Neurological: Negative for seizures, syncope, weakness, numbness and headaches.  Psychiatric/Behavioral: Negative for confusion and dysphoric mood.    Objective:    BP 135/79   Pulse 76   Ht 5\' 9"  (1.753 m)   Wt 214 lb (97.1 kg)   BMI 31.60 kg/m   Wt Readings from Last 3 Encounters:  09/06/17 214 lb (97.1 kg)  08/30/17 216 lb (98 kg)  05/25/17 217 lb (98.4 kg)     Physical Exam  Constitutional: He is oriented to person, place, and time. He appears well-developed. He is cooperative. No distress.  HENT:  Head: Normocephalic and atraumatic.  Eyes: EOM are normal.  Neck: Normal range of motion. Neck supple.  No tracheal deviation present. No thyromegaly present.  Cardiovascular: Normal rate, S1 normal, S2 normal and normal heart sounds.  Exam reveals no gallop.   No murmur heard. Pulses:      Dorsalis pedis pulses are 1+ on the right side, and 1+ on the left side.       Posterior tibial pulses are 1+ on the right side, and 1+ on the left side.  Pulmonary/Chest: Breath sounds normal. No respiratory distress. He has no wheezes.  Abdominal: Soft. Bowel sounds are normal. He exhibits no distension. There is no tenderness. There is no guarding and no CVA tenderness.  Musculoskeletal: He exhibits no edema.       Right shoulder: He exhibits no swelling and no deformity.  Neurological: He is alert and oriented to person, place, and time. He has normal strength and normal reflexes. No cranial nerve deficit or sensory deficit. Gait normal.  Skin: Skin is warm and dry. No rash noted. No cyanosis. Nails show no clubbing.  Psychiatric: He has a normal mood and affect. His speech is normal. Cognition and memory are normal.  Generally skeptical of medications.    Recent Results (from the past 2160 hour(s))  COMPLETE METABOLIC PANEL WITH GFR     Status: Abnormal  Collection Time: 08/30/17  2:48 PM  Result Value Ref Range   Glucose, Bld 214 (H) 65 - 139 mg/dL    Comment: .        Non-fasting reference interval .    BUN 15 7 - 25 mg/dL   Creat 1.61 0.96 - 0.45 mg/dL    Comment: For patients >44 years of age, the reference limit for Creatinine is approximately 13% higher for people identified as African-American. .    GFR, Est Non African American 99 > OR = 60 mL/min/1.34m2   GFR, Est African American 115 > OR = 60 mL/min/1.31m2   BUN/Creatinine Ratio NOT APPLICABLE 6 - 22 (calc)   Sodium 136 135 - 146 mmol/L   Potassium 4.2 3.5 - 5.3 mmol/L   Chloride 99 98 - 110 mmol/L   CO2 29 20 - 32 mmol/L   Calcium 10.2 8.6 - 10.3 mg/dL   Total Protein 7.1 6.1 - 8.1 g/dL   Albumin 4.5 3.6 - 5.1 g/dL    Globulin 2.6 1.9 - 3.7 g/dL (calc)   AG Ratio 1.7 1.0 - 2.5 (calc)   Total Bilirubin 0.5 0.2 - 1.2 mg/dL   Alkaline phosphatase (APISO) 69 40 - 115 U/L   AST 14 10 - 35 U/L   ALT 21 9 - 46 U/L  Hemoglobin A1c     Status: Abnormal   Collection Time: 08/30/17  2:48 PM  Result Value Ref Range   Hgb A1c MFr Bld 10.8 (H) <5.7 % of total Hgb    Comment: For someone without known diabetes, a hemoglobin A1c value of 6.5% or greater indicates that they may have  diabetes and this should be confirmed with a follow-up  test. . For someone with known diabetes, a value <7% indicates  that their diabetes is well controlled and a value  greater than or equal to 7% indicates suboptimal  control. A1c targets should be individualized based on  duration of diabetes, age, comorbid conditions, and  other considerations. . Currently, no consensus exists regarding use of hemoglobin A1c for diagnosis of diabetes for children. .    Mean Plasma Glucose 263 (calc)   eAG (mmol/L) 14.6 (calc)  TSH     Status: None   Collection Time: 08/30/17  2:48 PM  Result Value Ref Range   TSH 0.83 0.40 - 4.50 mIU/L  T4, free     Status: None   Collection Time: 08/30/17  2:48 PM  Result Value Ref Range   Free T4 1.1 0.8 - 1.8 ng/dL  Microalbumin / creatinine urine ratio     Status: None   Collection Time: 08/30/17  2:48 PM  Result Value Ref Range   Creatinine, Urine 75 20 - 320 mg/dL   Microalb, Ur 0.6 mg/dL    Comment: Reference Range Not established    Microalb Creat Ratio 8 <30 mcg/mg creat    Comment: . The ADA defines abnormalities in albumin excretion as follows: Marland Kitchen Category         Result (mcg/mg creatinine) . Normal                    <30 Microalbuminuria         30-299  Clinical albuminuria   > OR = 300 . The ADA recommends that at least two of three specimens collected within a 3-6 month period be abnormal before considering a patient to be within a diagnostic category.   VITAMIN D 25  Hydroxy (Vit-D Deficiency, Fractures)  Status: None   Collection Time: 08/30/17  2:48 PM  Result Value Ref Range   Vit D, 25-Hydroxy 31 30 - 100 ng/mL    Comment: Vitamin D Status         25-OH Vitamin D: . Deficiency:                    <20 ng/mL Insufficiency:             20 - 29 ng/mL Optimal:                 > or = 30 ng/mL . For 25-OH Vitamin D testing on patients on  D2-supplementation and patients for whom quantitation  of D2 and D3 fractions is required, the QuestAssureD(TM) 25-OH VIT D, (D2,D3), LC/MS/MS is recommended: order  code 16109 (patients >66yrs). . For more information on this test, go to: http://education.questdiagnostics.com/faq/FAQ163 (This link is being provided for  informational/educational purposes only.)       Lipid Panel ( most recent) Lipid Panel     Component Value Date/Time   CHOL 230 (H) 01/28/2013 1243   TRIG 146.0 01/28/2013 1243   HDL 42.50 01/28/2013 1243   CHOLHDL 5 01/28/2013 1243   VLDL 29.2 01/28/2013 1243   LDLDIRECT 153.4 01/28/2013 1243     Assessment & Plan:   1. Uncontrolled type 2 diabetes mellitus with hyperglycemia (HCC) - Patient has currently uncontrolled symptomatic type 2 DM since  56 years of age. - His repeat recent labs show A1c of 10.8%.  -his diabetes is complicated by obesity/sedentary  life and GUSTAVE LINDEMAN remains at a high risk for more acute and chronic complications which include CAD, CVA, CKD, retinopathy, and neuropathy. These are all discussed in detail with the patient.  - I have counseled him on diet management and weight loss, by adopting a carbohydrate restricted/protein rich diet.  -  Suggestion is made for him to avoid simple carbohydrates  from his diet including Cakes, Sweet Desserts / Pastries, Ice Cream, Soda (diet and regular), Sweet Tea, Candies, Chips, Cookies, Store Bought Juices, Alcohol in Excess of  1-2 drinks a day, Artificial Sweeteners, and "Sugar-free" Products. This will help  patient to have stable blood glucose profile and potentially avoid unintended weight gain.  - I encouraged him to switch to  unprocessed or minimally processed complex starch and increased protein intake (animal or plant source), fruits, and vegetables.  - he is advised to stick to a routine mealtimes to eat 3 meals  a day and avoid unnecessary snacks ( to snack only to correct hypoglycemia).   - he has been scheduled with Norm Salt, RDN, CDE for individualized DM education- consult pending.  - I have approached him with the following individualized plan to manage diabetes and patient agrees:   - Given his current glycemic burden with A1c of 10.8%, he was approached for basal insulin, however he declined. - He is however willing to resume metformin. I discussed and initiated metformin 500 minute grams by mouth twice a day, continued monitoring of blood glucose 2 times a day-daily before breakfast and at bedtime.  -Patient is encouraged to call clinic for blood glucose levels less than 70 or above 200 mg /dl. - He will be maximized on metformin upto  2000 g daily. - He has several options of treatment if renal function is normal.  - Patient specific target  A1c;  LDL, HDL, Triglycerides, and  Waist Circumference were discussed in detail.  2)  BP/HTN: Controlled. He is not on any medications, wishes to avoid medications. 3) Lipids/HPL:   He says he does not tolerate Zocor.  4)  Weight/Diet: CDE Consult has been initiated and pending , exercise, and detailed carbohydrates information provided.  5) Chronic Care/Health Maintenance:  -he  Is not  on ACEI/ARB and Statin medications and  is encouraged to continue to follow up with Ophthalmology, Dentist,  Podiatrist at least yearly or according to recommendations, and advised to  stay away from smoking. I have recommended yearly flu vaccine and pneumonia vaccination at least every 5 years; moderate intensity exercise for up to 150 minutes  weekly; and  sleep for at least 7 hours a day.  - Time spent with the patient: 25 min, of which >50% was spent in reviewing his sugar logs , discussing his hypo- and hyper-glycemic episodes, reviewing his current and  previous labs and insulin doses and developing a plan to avoid hypo- and hyper-glycemia.   - I advised patient to seek a provider for PCP for primary care needs, and Dr. Royann Shivers for cardiology needs.  Follow up plan: - Return in about 3 months (around 12/07/2017) for meter, and logs.  Marquis Lunch, MD Phone: [phone]  Fax: [phone]   09/06/2017, 1:32 PM This note was partially dictated with voice recognition software. Similar sounding words can be transcribed inadequately or may not  be corrected upon review.

## 2017-09-06 NOTE — Patient Instructions (Signed)

## 2017-10-16 ENCOUNTER — Ambulatory Visit: Payer: Self-pay | Admitting: Nutrition

## 2017-10-17 ENCOUNTER — Telehealth: Payer: Self-pay | Admitting: Nutrition

## 2017-10-17 NOTE — Telephone Encounter (Signed)
VM to call and reschedule missed appt. 

## 2017-11-30 DIAGNOSIS — E1165 Type 2 diabetes mellitus with hyperglycemia: Secondary | ICD-10-CM | POA: Diagnosis not present

## 2017-12-01 LAB — COMPLETE METABOLIC PANEL WITH GFR
AG RATIO: 1.8 (calc) (ref 1.0–2.5)
ALT: 19 U/L (ref 9–46)
AST: 13 U/L (ref 10–35)
Albumin: 4.2 g/dL (ref 3.6–5.1)
Alkaline phosphatase (APISO): 71 U/L (ref 40–115)
BUN: 17 mg/dL (ref 7–25)
CALCIUM: 9.5 mg/dL (ref 8.6–10.3)
CO2: 29 mmol/L (ref 20–32)
CREATININE: 0.74 mg/dL (ref 0.70–1.33)
Chloride: 99 mmol/L (ref 98–110)
GFR, EST AFRICAN AMERICAN: 119 mL/min/{1.73_m2} (ref >=60)
GFR, EST NON AFRICAN AMERICAN: 102 mL/min/{1.73_m2} (ref >=60)
GLOBULIN: 2.3 g/dL (ref 1.9–3.7)
Glucose, Bld: 235 mg/dL — ABNORMAL HIGH (ref 65–139)
POTASSIUM: 4.4 mmol/L (ref 3.5–5.3)
SODIUM: 136 mmol/L (ref 135–146)
Total Bilirubin: 0.4 mg/dL (ref 0.2–1.2)
Total Protein: 6.5 g/dL (ref 6.1–8.1)

## 2017-12-01 LAB — HEMOGLOBIN A1C
EAG (MMOL/L): 14.7 (calc)
Hgb A1c MFr Bld: 10.9 % of total Hgb — ABNORMAL HIGH (ref <5.7)
Mean Plasma Glucose: 266 (calc)

## 2017-12-07 ENCOUNTER — Ambulatory Visit (INDEPENDENT_AMBULATORY_CARE_PROVIDER_SITE_OTHER): Payer: BLUE CROSS/BLUE SHIELD | Admitting: "Endocrinology

## 2017-12-07 ENCOUNTER — Encounter: Payer: Self-pay | Admitting: "Endocrinology

## 2017-12-07 VITALS — BP 118/74 | HR 94 | Ht 69.0 in | Wt 209.0 lb

## 2017-12-07 DIAGNOSIS — E782 Mixed hyperlipidemia: Secondary | ICD-10-CM | POA: Diagnosis not present

## 2017-12-07 DIAGNOSIS — E1165 Type 2 diabetes mellitus with hyperglycemia: Secondary | ICD-10-CM | POA: Diagnosis not present

## 2017-12-07 MED ORDER — INSULIN GLARGINE 300 UNIT/ML ~~LOC~~ SOPN: 20.0000 [IU] | PEN_INJECTOR | Freq: Every day | SUBCUTANEOUS | 2 refills | Status: DC

## 2017-12-07 MED ORDER — INSULIN PEN NEEDLE 31G X 8 MM MISC: 1.0000 | 3 refills | Status: DC

## 2017-12-07 NOTE — Patient Instructions (Signed)

## 2017-12-07 NOTE — Progress Notes (Signed)
Subjective:    Patient ID: Jacob Acosta, male    DOB: [birth year].  he is being seen in f/u for management of currently uncontrolled symptomatic type 2 diabetes requested by  Dr. Royann Shivers.  Patient does not have a PCP.   Past Medical History:  Diagnosis Date  . Anxiety   . High cholesterol    Past Surgical History:  Procedure Laterality Date  . dental sugery     at age 57  . TONSILLECTOMY  1980   Social History   Socioeconomic History  . Marital status: Married    Spouse name: Academic librarian  . Number of children: 0  . Years of education: None  . Highest education level: None  Social Needs  . Financial resource strain: None  . Food insecurity - worry: None  . Food insecurity - inability: None  . Transportation needs - medical: None  . Transportation needs - non-medical: None  Occupational History  . Occupation: drives delivery truck  Tobacco Use  . Smoking status: Light Tobacco Smoker    Types: Cigarettes  . Smokeless tobacco: Never Used  . Tobacco comment: only smoked occasionally  Substance and Sexual Activity  . Alcohol use: No  . Drug use: No  . Sexual activity: None  Other Topics Concern  . None  Social History Narrative  . None   Outpatient Encounter Medications as of 12/07/2017  Medication Sig  . Omega-3 Fatty Acids (FISH OIL) 1200 MG CAPS Take by mouth.  Marland Kitchen aspirin 81 MG tablet Take 162 mg by mouth daily.   . Insulin Glargine (TOUJEO SOLOSTAR) 300 UNIT/ML SOPN Inject 20 Units into the skin at bedtime.  . Insulin Pen Needle (B-D ULTRAFINE III SHORT PEN) 31G X 8 MM MISC 1 each by Does not apply route as directed.  . metFORMIN (GLUCOPHAGE) 500 MG tablet Take 1 tablet (500 mg total) by mouth 2 (two) times daily after a meal. (Patient not taking: Reported on 12/07/2017)  . metoprolol succinate (TOPROL XL) 25 MG 24 hr tablet Take 1 tablet (25 mg total) by mouth daily. (Patient not taking: Reported on 08/30/2017)  . naproxen sodium (ANAPROX) 220 MG tablet Take  220 mg by mouth 2 (two) times daily with a meal.  . simvastatin (ZOCOR) 40 MG tablet Take 1 tablet (40 mg total) by mouth at bedtime. (Patient not taking: Reported on 08/30/2017)  . [DISCONTINUED] atorvastatin (LIPITOR) 20 MG tablet Take 1 tablet (20 mg total) by mouth daily.   No facility-administered encounter medications on file as of 12/07/2017.     ALLERGIES: Allergies  Allergen Reactions  . Ciprofloxacin Hcl   . Codeine     Causes him to feel very anxious  . Penicillins     VACCINATION STATUS: There is no immunization history for the selected administration types on file for this patient.  Diabetes  He presents for f/u  diabetic visit. He has type 2 diabetes mellitus. Onset time: He was diagnosed at approximate age of 57 years. His disease course has been worsening. There are no hypoglycemic associated symptoms. Pertinent negatives for hypoglycemia include no confusion, headaches, pallor or seizures. Associated symptoms include blurred vision, polydipsia and polyuria.   Pertinent negatives for diabetes include no chest pain, no fatigue.  There are no hypoglycemic complications. Symptoms are worsening. There are no diabetic complications. Risk factors for coronary artery disease include diabetes mellitus, dyslipidemia, family history, male sex, obesity, sedentary lifestyle and tobacco exposure. When asked about current treatments, none were reported.  His weight is decreasing steadily. He is following a generally unhealthy diet. When asked about meal planning, he reported none. He has not had a previous visit with a dietitian. He participates in exercise intermittently. (He brought a  meter showing persistently above target blood glucose profile averaging between 250 and 275 mg/dL . An ACE inhibitor/angiotensin II receptor blocker is not being taken. He does not see a podiatrist.Eye exam is not current.  Hyperlipidemia  This is a chronic problem. The current episode started more than 1  year ago. The problem is uncontrolled. Exacerbating diseases include diabetes and obesity. Pertinent negatives include no chest pain, myalgias or shortness of breath. He is currently on no antihyperlipidemic treatment. Risk factors for coronary artery disease include diabetes mellitus, dyslipidemia, hypertension, a sedentary lifestyle, obesity and family history.  Hypertension  This is a chronic problem. The current episode started more than 1 year ago. Associated symptoms include blurred vision. Pertinent negatives include no chest pain, headaches, neck pain, palpitations or shortness of breath. Risk factors for coronary artery disease include diabetes mellitus, dyslipidemia, male gender, sedentary lifestyle, smoking/tobacco exposure and obesity.    Review of Systems  Constitutional: Negative for chills, fatigue, fever and unexpected weight change.  HENT: Negative for dental problem, mouth sores and trouble swallowing.   Eyes: Positive for blurred vision. Negative for visual disturbance.  Respiratory: Negative for cough, choking, chest tightness, shortness of breath and wheezing.   Cardiovascular: Negative for chest pain, palpitations and leg swelling.  Gastrointestinal: Negative for abdominal distention, abdominal pain, constipation, diarrhea, nausea and vomiting.  Endocrine: Positive for polydipsia and polyuria. Negative for polyphagia.  Genitourinary: Negative for dysuria, flank pain, hematuria and urgency.  Musculoskeletal: Negative for back pain, gait problem, myalgias and neck pain.  Skin: Negative for pallor, rash and wound.  Neurological: Negative for seizures, syncope, weakness, numbness and headaches.  Psychiatric/Behavioral: Negative for confusion and dysphoric mood.    Objective:    BP 118/74   Pulse 94   Ht 5\' 9"  (1.753 m)   Wt 209 lb (94.8 kg)   BMI 30.86 kg/m   Wt Readings from Last 3 Encounters:  12/07/17 209 lb (94.8 kg)  09/06/17 214 lb (97.1 kg)  08/30/17 216 lb (98  kg)     Physical Exam  Constitutional: He is oriented to person, place, and time. He appears well-developed. He is cooperative. No distress.  HENT:  Head: Normocephalic and atraumatic.  Eyes: EOM are normal.  Neck: Normal range of motion. Neck supple. No tracheal deviation present. No thyromegaly present.  Cardiovascular: Normal rate, S1 normal, S2 normal and normal heart sounds.  Exam reveals no gallop.   No murmur heard. Pulses:      Dorsalis pedis pulses are 1+ on the right side, and 1+ on the left side.       Posterior tibial pulses are 1+ on the right side, and 1+ on the left side.  Pulmonary/Chest: Breath sounds normal. No respiratory distress. He has no wheezes.  Abdominal: Soft. Bowel sounds are normal. He exhibits no distension. There is no tenderness. There is no guarding and no CVA tenderness.  Musculoskeletal: He exhibits no edema.       Right shoulder: He exhibits no swelling and no deformity.  Neurological: He is alert and oriented to person, place, and time. He has normal strength and normal reflexes. No cranial nerve deficit or sensory deficit. Gait normal.  Skin: Skin is warm and dry. No rash noted. No cyanosis. Nails show no clubbing.  Psychiatric: He has a normal mood and affect. His speech is normal. Cognition and memory are normal.  Generally skeptical of medications.    Recent Results (from the past 2160 hour(s))  COMPLETE METABOLIC PANEL WITH GFR     Status: Abnormal   Collection Time: 11/30/17 12:41 PM  Result Value Ref Range   Glucose, Bld 235 (H) 65 - 139 mg/dL    Comment: .        Non-fasting reference interval .    BUN 17 7 - 25 mg/dL   Creat 4.09 8.11 - 9.14 mg/dL    Comment: For patients >57 years of age, the reference limit for Creatinine is approximately 13% higher for people identified as African-American. .    GFR, Est Non African American 102 > OR = 60 mL/min/1.50m2   GFR, Est African American 119 > OR = 60 mL/min/1.28m2   BUN/Creatinine  Ratio NOT APPLICABLE 6 - 22 (calc)   Sodium 136 135 - 146 mmol/L   Potassium 4.4 3.5 - 5.3 mmol/L   Chloride 99 98 - 110 mmol/L   CO2 29 20 - 32 mmol/L   Calcium 9.5 8.6 - 10.3 mg/dL   Total Protein 6.5 6.1 - 8.1 g/dL   Albumin 4.2 3.6 - 5.1 g/dL   Globulin 2.3 1.9 - 3.7 g/dL (calc)   AG Ratio 1.8 1.0 - 2.5 (calc)   Total Bilirubin 0.4 0.2 - 1.2 mg/dL   Alkaline phosphatase (APISO) 71 40 - 115 U/L   AST 13 10 - 35 U/L   ALT 19 9 - 46 U/L  Hemoglobin A1c     Status: Abnormal   Collection Time: 11/30/17 12:41 PM  Result Value Ref Range   Hgb A1c MFr Bld 10.9 (H) <5.7 % of total Hgb    Comment: For someone without known diabetes, a hemoglobin A1c value of 6.5% or greater indicates that they may have  diabetes and this should be confirmed with a follow-up  test. . For someone with known diabetes, a value <7% indicates  that their diabetes is well controlled and a value  greater than or equal to 7% indicates suboptimal  control. A1c targets should be individualized based on  duration of diabetes, age, comorbid conditions, and  other considerations. . Currently, no consensus exists regarding use of hemoglobin A1c for diagnosis of diabetes for children. .    Mean Plasma Glucose 266 (calc)   eAG (mmol/L) 14.7 (calc)      Lipid Panel ( most recent) Lipid Panel     Component Value Date/Time   CHOL 230 (H) 01/28/2013 1243   TRIG 146.0 01/28/2013 1243   HDL 42.50 01/28/2013 1243   CHOLHDL 5 01/28/2013 1243   VLDL 29.2 01/28/2013 1243   LDLDIRECT 153.4 01/28/2013 1243     Assessment & Plan:   1. Uncontrolled type 2 diabetes mellitus with hyperglycemia (HCC) - Patient has currently uncontrolled symptomatic type 2 DM since  57 years of age. - His repeat recent labs show A1c of 10.9%.  -his diabetes is complicated by obesity/sedentary  life and BERTIE SIMIEN remains at a high risk for more acute and chronic complications which include CAD, CVA, CKD, retinopathy, and  neuropathy. These are all discussed in detail with the patient.  - I have counseled him on diet management and weight loss, by adopting a carbohydrate restricted/protein rich diet.  -  Suggestion is made for him to avoid simple carbohydrates  from his diet including Cakes, Sweet Desserts / Pastries,  Ice Cream, Soda (diet and regular), Sweet Tea, Candies, Chips, Cookies, Store Bought Juices, Alcohol in Excess of  1-2 drinks a day, Artificial Sweeteners, and "Sugar-free" Products. This will help patient to have stable blood glucose profile and potentially avoid unintended weight gain.   - I encouraged him to switch to  unprocessed or minimally processed complex starch and increased protein intake (animal or plant source), fruits, and vegetables.  - he is advised to stick to a routine mealtimes to eat 3 meals  a day and avoid unnecessary snacks ( to snack only to correct hypoglycemia).   - he has been scheduled with Norm SaltPenny Crumpton, RDN, CDE for individualized DM education- consult pending.  - I have approached him with the following individualized plan to manage diabetes and patient agrees:   -  He did not tolerate metformin hence he stopped it.  - Given his current glycemic burden with A1c of 10.9%, once again, he is approached for basal insulin. This time he is open to consider.  -  I discussed , demonstrated and initiated low-dose basal insulin with  Toujeo 20 units daily at bedtime, associated with monitoring of blood glucose 4 times a day-before meals and at bedtime and return in one week with his meter and logs.  Profile Insulin Pen Was Provided from Clinic.  - He is advised to discontinue metformin.  -Patient is encouraged to call clinic for blood glucose levels less than 70 or above 300 mg /dl.  He will be considered for incretin  therapy as appropriate on subsequent visits. - Patient specific target  A1c;  LDL, HDL, Triglycerides, and  Waist Circumference were discussed in detail.  2)  BP/HTN: His blood pressure is controlled to target. He is not on any medications, wishes to avoid medications. 3) Lipids/HPL: Uncontrolled with LDL 153.  He says he does not tolerate Zocor.  4)  Weight/Diet: CDE Consult has been initiated and pending , exercise, and detailed carbohydrates information provided.  5) Chronic Care/Health Maintenance:  -he  Is not  on ACEI/ARB and Statin medications ( wishes to avoid these medications)  and  is encouraged to continue to follow up with Ophthalmology, Dentist,  Podiatrist at least yearly or according to recommendations, and advised to  stay away from smoking. I have recommended yearly flu vaccine and pneumonia vaccination at least every 5 years; moderate intensity exercise for up to 150 minutes weekly; and  sleep for at least 7 hours a day.  - Time spent with the patient: 25 min, of which >50% was spent in reviewing her  current and  previous labs, previous treatments, and medications  doses and developing a plan for long-term care.   - I advised patient to seek a provider for PCP for primary care needs, and Dr. Royann Shiversroitoru for cardiology needs.  Follow up plan: - Return in about 1 week (around 12/14/2017) for follow up with meter and logs- no labs.  Marquis LunchGebre Gloriana Piltz, MD Phone: [phone]  Fax: [phone]   12/07/2017, 6:12 PM This note was partially dictated with voice recognition software. Similar sounding words can be transcribed inadequately or may not  be corrected upon review.

## 2017-12-10 ENCOUNTER — Encounter (HOSPITAL_COMMUNITY): Payer: Self-pay | Admitting: *Deleted

## 2017-12-10 ENCOUNTER — Other Ambulatory Visit: Payer: Self-pay

## 2017-12-10 ENCOUNTER — Emergency Department (HOSPITAL_COMMUNITY): Payer: BLUE CROSS/BLUE SHIELD

## 2017-12-10 ENCOUNTER — Emergency Department (HOSPITAL_COMMUNITY)
Admission: EM | Admit: 2017-12-10 | Discharge: 2017-12-11 | Disposition: A | Discharge status: Home / Self Care | Payer: BLUE CROSS/BLUE SHIELD | Attending: Emergency Medicine | Admitting: Emergency Medicine

## 2017-12-10 DIAGNOSIS — R05 Cough: Secondary | ICD-10-CM | POA: Diagnosis not present

## 2017-12-10 DIAGNOSIS — Z7982 Long term (current) use of aspirin: Secondary | ICD-10-CM | POA: Diagnosis not present

## 2017-12-10 DIAGNOSIS — F17210 Nicotine dependence, cigarettes, uncomplicated: Secondary | ICD-10-CM | POA: Insufficient documentation

## 2017-12-10 DIAGNOSIS — R002 Palpitations: Secondary | ICD-10-CM | POA: Diagnosis not present

## 2017-12-10 DIAGNOSIS — E119 Type 2 diabetes mellitus without complications: Secondary | ICD-10-CM | POA: Diagnosis not present

## 2017-12-10 DIAGNOSIS — R197 Diarrhea, unspecified: Secondary | ICD-10-CM | POA: Diagnosis not present

## 2017-12-10 DIAGNOSIS — R072 Precordial pain: Secondary | ICD-10-CM

## 2017-12-10 DIAGNOSIS — Z794 Long term (current) use of insulin: Secondary | ICD-10-CM | POA: Diagnosis not present

## 2017-12-10 DIAGNOSIS — R079 Chest pain, unspecified: Secondary | ICD-10-CM | POA: Diagnosis not present

## 2017-12-10 LAB — BASIC METABOLIC PANEL
ANION GAP: 12 (ref 5–15)
BUN: 19 mg/dL (ref 6–20)
CALCIUM: 10.2 mg/dL (ref 8.9–10.3)
CO2: 27 mmol/L (ref 22–32)
Chloride: 100 mmol/L — ABNORMAL LOW (ref 101–111)
Creatinine, Ser: 0.89 mg/dL (ref 0.61–1.24)
GLUCOSE: 221 mg/dL — AB (ref 65–99)
Potassium: 3.5 mmol/L (ref 3.5–5.1)
Sodium: 139 mmol/L (ref 135–145)

## 2017-12-10 LAB — CBC
HEMATOCRIT: 45.7 % (ref 39.0–52.0)
HEMOGLOBIN: 15.2 g/dL (ref 13.0–17.0)
MCH: 29.2 pg (ref 26.0–34.0)
MCHC: 33.3 g/dL (ref 30.0–36.0)
MCV: 87.7 fL (ref 78.0–100.0)
Platelets: 246 10*3/uL (ref 150–400)
RBC: 5.21 MIL/uL (ref 4.22–5.81)
RDW: 12.8 % (ref 11.5–15.5)
WBC: 7.2 10*3/uL (ref 4.0–10.5)

## 2017-12-10 NOTE — ED Triage Notes (Signed)
Pt woke up this evening with central chest pain that did not radiate; pt describes it as a tightness; pt states he had some shaking and sob with the episode

## 2017-12-10 NOTE — ED Provider Notes (Signed)
[facility] Provider Note   CSN: [id number] Arrival date & time: 12/10/17  2305     History   Chief Complaint Chief Complaint  Patient presents with  . Chest Pain    HPI Delice LeschJoe D Coakley is a 57 y.o. male.  The history is provided by the patient and the spouse.  Chest Pain   This is a new problem. The current episode started 1 to 2 hours ago. The problem occurs constantly. The problem has been resolved. Associated with: sleeping. The pain is present in the substernal region. The pain is mild. The quality of the pain is described as pressure-like. The pain does not radiate. Associated symptoms include cough and palpitations. Pertinent negatives include no abdominal pain, no diaphoresis, no hemoptysis, no shortness of breath, no syncope and no vomiting. He has tried nothing for the symptoms. Risk factors include male gender.  His past medical history is significant for diabetes and hyperlipidemia.  His family medical history is significant for CAD.  Patient reports recently started a new diabetic medication, tonight he woke up feeling very "shaky" and felt that he was having palpitations. He reports he also had some mild chest pressure, but denies diaphoresis, denies shortness of breath, denies vomiting. He is now improved. He is unsure what his blood glucose was at the time, but it was over 200 prior to taking the medications, and he only drank water when his symptoms began He reports recent cough and bronchitis-like symptoms He denies shortness of breath, he denies hemoptysis. He denies history of CAD/PE/stroke He does report strong family history of CAD  Past Medical History:  Diagnosis Date  . Anxiety   . High cholesterol     Patient Active Problem List   Diagnosis Date Noted  . Uncontrolled type 2 diabetes mellitus with hyperglycemia (HCC) 08/30/2017  . Mixed hyperlipidemia 08/30/2017  . Physical exam, annual 01/28/2013  . Overweight(278.02)  01/28/2013  . History of nephrolithiasis 01/28/2013  . Tendonitis of ankle or foot 01/28/2013    Past Surgical History:  Procedure Laterality Date  . dental sugery     at age 89  . TONSILLECTOMY  1980       Home Medications    Prior to Admission medications   Medication Sig Start Date End Date Taking? Authorizing Provider  aspirin 81 MG tablet Take 162 mg by mouth daily.     [provider]  Insulin Glargine (TOUJEO SOLOSTAR) 300 UNIT/ML SOPN Inject 20 Units into the skin at bedtime. 12/07/17   Roma KayserNida, Gebreselassie W, MD  Insulin Pen Needle (B-D ULTRAFINE III SHORT PEN) 31G X 8 MM MISC 1 each by Does not apply route as directed. 12/07/17   Roma KayserNida, Gebreselassie W, MD  metFORMIN (GLUCOPHAGE) 500 MG tablet Take 1 tablet (500 mg total) by mouth 2 (two) times daily after a meal. Patient not taking: Reported on 12/07/2017 09/06/17   Roma KayserNida, Gebreselassie W, MD  metoprolol succinate (TOPROL XL) 25 MG 24 hr tablet Take 1 tablet (25 mg total) by mouth daily. Patient not taking: Reported on 08/30/2017 06/01/17   Croitoru, Mihai, MD  naproxen sodium (ANAPROX) 220 MG tablet Take 220 mg by mouth 2 (two) times daily with a meal.    [provider]  Omega-3 Fatty Acids (FISH OIL) 1200 MG CAPS Take by mouth.    [provider]  simvastatin (ZOCOR) 40 MG tablet Take 1 tablet (40 mg total) by mouth at bedtime. Patient not taking: Reported on 08/30/2017 01/30/13  Michele Mcalpine, MD    Family History Family History  Problem Relation Age of Onset  . Anxiety disorder Mother   . Depression Father   . Alcohol abuse Father     Social History Social History   Tobacco Use  . Smoking status: Light Tobacco Smoker    Types: Cigarettes  . Smokeless tobacco: Never Used  . Tobacco comment: only smoked occasionally  Substance Use Topics  . Alcohol use: No  . Drug use: No     Allergies   Ciprofloxacin hcl; Codeine; and Penicillins   Review of Systems Review of Systems    Constitutional: Negative for diaphoresis.  Respiratory: Positive for cough. Negative for hemoptysis and shortness of breath.   Cardiovascular: Positive for chest pain and palpitations. Negative for leg swelling and syncope.  Gastrointestinal: Positive for diarrhea. Negative for abdominal pain and vomiting.  Neurological: Negative for syncope.  All other systems reviewed and are negative.    Physical Exam Updated Vital Signs BP 129/79   Pulse 76   Temp 98.6 F (37 C) (Oral)   Resp 16   Ht 1.753 m (5\' 9" )   Wt 94.8 kg (209 lb)   SpO2 98%   BMI 30.86 kg/m   Physical Exam CONSTITUTIONAL: Well developed/well nourished HEAD: Normocephalic/atraumatic EYES: EOMI/PERRL ENMT: Mucous membranes moist NECK: supple no meningeal signs SPINE/BACK:entire spine nontender CV: S1/S2 noted, no murmurs/rubs/gallops noted LUNGS: Lungs are clear to auscultation bilaterally, no apparent distress ABDOMEN: soft, nontender, no rebound or guarding, bowel sounds noted throughout abdomen GU:no cva tenderness NEURO: Pt is awake/alert/appropriate, moves all extremitiesx4.  No facial droop.   EXTREMITIES: pulses normal/equal, full ROM, no calf tenderness bilaterally SKIN: warm, color normal PSYCH: no abnormalities of mood noted, alert and oriented to situation   ED Treatments / Results  Labs (all labs ordered are listed, but only abnormal results are displayed) Labs Reviewed  BASIC METABOLIC PANEL - Abnormal; Notable for the following components:      Result Value   Chloride 100 (*)    Glucose, Bld 221 (*)    All other components within normal limits  CBC  TROPONIN I  TROPONIN I    EKG  EKG Interpretation  Date/Time:  Sunday December 10 2017 23:15:46 EST Ventricular Rate:  83 PR Interval:    QRS Duration: 88 QT Interval:  361 QTC Calculation: 425 R Axis:   65 Text Interpretation:  Sinus arrhythmia No previous ECGs available Confirmed by Kamorah Nevils ([id number]) on 12/10/2017 11:18:38  PM       Radiology Dg Chest 2 View  Result Date: 12/11/2017 CLINICAL DATA:  Chest pain EXAM: CHEST  2 VIEW COMPARISON:  Chest radiograph 12/16/2013 FINDINGS: The heart size and mediastinal contours are within normal limits. Both lungs are clear. The visualized skeletal structures are unremarkable. IMPRESSION: No active cardiopulmonary disease. Electronically Signed   By: Kevin  Herman M.D.   On: 12/11/2017 00:10    Procedures Procedures (including critical care time)  Medications Ordered in ED Medications - No data to display   Initial Impression / Assessment and Plan / ED Course  I have reviewed the triage vital signs and the nursing notes.  Pertinent labs & imaging results that were available during my care of the patient were reviewed by me and considered in my medical decision making (see chart for details).     11 :59 PM HEART score = 4 Initial EKG is unremarkable. He reports his main issue was felt like he was shaky.  His  symptoms are more consistent with hypoglycemia, but his glucose has been above 200 so this is unlikely It could be related to recent new medications Workup pending at this time.  He does report cardiology evaluation last summer and normal stress test.  He reports he is very active and rides his bike frequently without any difficulty 1:19 AM Patient feels well this time, no new chest pain. His heart score is above 3, but my suspicion for ACS at this time is low. I gave him the option of admission to the hospital for monitoring for chest pain, or doing a repeat troponin and discharge At this point patient prefers to go home if possible 3:12 AM Patient had no recurrent symptoms. He is requesting discharge, his repeat troponin is negative.  We discussed strict ER return precautions Final Clinical Impressions(s) / ED Diagnoses   Final diagnoses:  Precordial pain    ED Discharge Orders    None       Zadie Rhine, MD 12/11/17 [phone]

## 2017-12-11 ENCOUNTER — Ambulatory Visit: Payer: Self-pay | Admitting: Nutrition

## 2017-12-11 LAB — TROPONIN I: Troponin I: <0.03 ng/mL (ref <0.03)

## 2017-12-11 NOTE — Discharge Instructions (Signed)

## 2017-12-13 ENCOUNTER — Ambulatory Visit: Payer: BLUE CROSS/BLUE SHIELD | Admitting: "Endocrinology

## 2017-12-13 ENCOUNTER — Encounter: Payer: Self-pay | Admitting: "Endocrinology

## 2017-12-13 VITALS — BP 124/85 | HR 76 | Ht 69.0 in | Wt 209.0 lb

## 2017-12-13 DIAGNOSIS — E1165 Type 2 diabetes mellitus with hyperglycemia: Secondary | ICD-10-CM | POA: Diagnosis not present

## 2017-12-13 DIAGNOSIS — E782 Mixed hyperlipidemia: Secondary | ICD-10-CM

## 2017-12-13 DIAGNOSIS — Z9119 Patient's noncompliance with other medical treatment and regimen: Secondary | ICD-10-CM

## 2017-12-13 DIAGNOSIS — Z91199 Patient's noncompliance with other medical treatment and regimen due to unspecified reason: Secondary | ICD-10-CM | POA: Insufficient documentation

## 2017-12-13 MED ORDER — SITAGLIPTIN PHOSPHATE 50 MG PO TABS: 50.0000 mg | ORAL_TABLET | Freq: Every day | ORAL | 3 refills | Status: DC

## 2017-12-13 NOTE — Patient Instructions (Signed)

## 2017-12-13 NOTE — Progress Notes (Signed)
Subjective:    Patient ID: Jacob Acosta, male    DOB: [birth year].  he is being seen in f/u for management of currently uncontrolled symptomatic type 2 diabetes requested by  Dr. Sallyanne Kuster.  Patient does not have a PCP.   Past Medical History:  Diagnosis Date  . Anxiety   . High cholesterol    Past Surgical History:  Procedure Laterality Date  . dental sugery     at age 57  . TONSILLECTOMY  1980   Social History   Socioeconomic History  . Marital status: Married    Spouse name: Advertising account executive  . Number of children: 0  . Years of education: None  . Highest education level: None  Social Needs  . Financial resource strain: None  . Food insecurity - worry: None  . Food insecurity - inability: None  . Transportation needs - medical: None  . Transportation needs - non-medical: None  Occupational History  . Occupation: drives delivery truck  Tobacco Use  . Smoking status: Light Tobacco Smoker    Types: Cigarettes  . Smokeless tobacco: Never Used  . Tobacco comment: only smoked occasionally  Substance and Sexual Activity  . Alcohol use: No  . Drug use: No  . Sexual activity: None  Other Topics Concern  . None  Social History Narrative  . None   Outpatient Encounter Medications as of 12/13/2017  Medication Sig  . aspirin 81 MG tablet Take 162 mg by mouth daily.   . Insulin Pen Needle (B-D ULTRAFINE III SHORT PEN) 31G X 8 MM MISC 1 each by Does not apply route as directed.  . metoprolol succinate (TOPROL XL) 25 MG 24 hr tablet Take 1 tablet (25 mg total) by mouth daily. (Patient not taking: Reported on 08/30/2017)  . naproxen sodium (ANAPROX) 220 MG tablet Take 220 mg by mouth 2 (two) times daily with a meal.  . Omega-3 Fatty Acids (FISH OIL) 1200 MG CAPS Take by mouth.  . simvastatin (ZOCOR) 40 MG tablet Take 1 tablet (40 mg total) by mouth at bedtime. (Patient not taking: Reported on 08/30/2017)  . sitaGLIPtin (JANUVIA) 50 MG tablet Take 1 tablet (50 mg total) by  mouth daily.  . [DISCONTINUED] Insulin Glargine (TOUJEO SOLOSTAR) 300 UNIT/ML SOPN Inject 20 Units into the skin at bedtime. (Patient not taking: Reported on 12/13/2017)  . [DISCONTINUED] metFORMIN (GLUCOPHAGE) 500 MG tablet Take 1 tablet (500 mg total) by mouth 2 (two) times daily after a meal. (Patient not taking: Reported on 12/07/2017)   No facility-administered encounter medications on file as of 12/13/2017.     ALLERGIES: Allergies  Allergen Reactions  . Ciprofloxacin Hcl   . Codeine     Causes him to feel very anxious  . Penicillins     VACCINATION STATUS: There is no immunization history for the selected administration types on file for this patient.  Diabetes  He presents for f/u  diabetic visit. He has type 2 diabetes mellitus. Onset time: He was diagnosed at approximate age of 30 years. His disease course has been worsening. During his last visit, he reluctantly accepted to  start basal insulin, Toujeo 20 units.  He can not confirm to me if he actually took any shot of that. On 12/10/2017, he visited ER with anxiety, palpitations. His BG was 221. He decided not to take the insulin.  There are no hypoglycemic associated symptoms. Pertinent negatives for hypoglycemia include no confusion, headaches, pallor or seizures. Associated symptoms include blurred vision,  polydipsia and polyuria.   Pertinent negatives for diabetes include no chest pain, no fatigue.  There are no hypoglycemic complications. Symptoms are worsening. There are no diabetic complications. Risk factors for coronary artery disease include diabetes mellitus, dyslipidemia, family history, male sex, obesity, sedentary lifestyle and tobacco exposure. When asked about current treatments, none were reported. His weight is decreasing steadily. He is following a generally unhealthy diet. When asked about meal planning, he reported none. He has not had a previous visit with a dietitian. He participates in exercise  intermittently. (He brought a  meter showing persistently above target blood glucose profile averaging between 250 and 275 mg/dL . An ACE inhibitor/angiotensin II receptor blocker is not being taken. He does not see a podiatrist.Eye exam is not current.   Hyperlipidemia  This is a chronic problem. The current episode started more than 1 year ago. The problem is uncontrolled. Exacerbating diseases include diabetes and obesity. Pertinent negatives include no chest pain, myalgias or shortness of breath. He is currently on no antihyperlipidemic treatment. Risk factors for coronary artery disease include diabetes mellitus, dyslipidemia, hypertension, a sedentary lifestyle, obesity and family history.   Hypertension  This is a chronic problem. The current episode started more than 1 year ago. Associated symptoms include blurred vision. Pertinent negatives include no chest pain, headaches, neck pain, palpitations or shortness of breath. Risk factors for coronary artery disease include diabetes mellitus, dyslipidemia, male gender, sedentary lifestyle, smoking/tobacco exposure and obesity.    Review of Systems  Constitutional: Negative for chills, fatigue, fever and unexpected weight change.  HENT: Negative for dental problem, mouth sores and trouble swallowing.   Eyes: Positive for blurred vision. Negative for visual disturbance.  Respiratory: Negative for cough, choking, chest tightness, shortness of breath and wheezing.   Cardiovascular: Negative for chest pain, palpitations and leg swelling.  Gastrointestinal: Negative for abdominal distention, abdominal pain, constipation, diarrhea, nausea and vomiting.  Endocrine: Positive for polydipsia and polyuria. Negative for polyphagia.  Genitourinary: Negative for dysuria, flank pain, hematuria and urgency.  Musculoskeletal: Negative for back pain, gait problem, myalgias and neck pain.  Skin: Negative for pallor, rash and wound.  Neurological: Negative for  seizures, syncope, weakness, numbness and headaches.  Psychiatric/Behavioral: Negative for confusion and dysphoric mood.    Objective:    BP 124/85   Pulse 76   Ht '5\' 9"'  (1.753 m)   Wt 209 lb (94.8 kg)   BMI 30.86 kg/m   Wt Readings from Last 3 Encounters:  12/13/17 209 lb (94.8 kg)  12/10/17 209 lb (94.8 kg)  12/07/17 209 lb (94.8 kg)     Physical Exam  Constitutional: He is oriented to person, place, and time. He appears well-developed. He is cooperative. No distress.  HENT:  Head: Normocephalic and atraumatic.  Eyes: EOM are normal.  Neck: Normal range of motion. Neck supple. No tracheal deviation present. No thyromegaly present.  Cardiovascular: Normal rate, S1 normal, S2 normal and normal heart sounds.  Exam reveals no gallop.   No murmur heard. Pulses:      Dorsalis pedis pulses are 1+ on the right side, and 1+ on the left side.       Posterior tibial pulses are 1+ on the right side, and 1+ on the left side.  Pulmonary/Chest: Breath sounds normal. No respiratory distress. He has no wheezes.  Abdominal: Soft. Bowel sounds are normal. He exhibits no distension. There is no tenderness. There is no guarding and no CVA tenderness.  Musculoskeletal: He exhibits no edema.  Right shoulder: He exhibits no swelling and no deformity.  Neurological: He is alert and oriented to person, place, and time. He has normal strength and normal reflexes. No cranial nerve deficit or sensory deficit. Gait normal.  Skin: Skin is warm and dry. No rash noted. No cyanosis. Nails show no clubbing.  Psychiatric: He has a normal mood and affect. His speech is normal. Cognition and memory are normal.  Generally skeptical of medications.    Recent Results (from the past 2160 hour(s))  COMPLETE METABOLIC PANEL WITH GFR     Status: Abnormal   Collection Time: 11/30/17 12:41 PM  Result Value Ref Range   Glucose, Bld 235 (H) 65 - 139 mg/dL    Comment: .        Non-fasting reference interval .     BUN 17 7 - 25 mg/dL   Creat 0.74 0.70 - 1.33 mg/dL    Comment: For patients >38 years of age, the reference limit for Creatinine is approximately 13% higher for people identified as African-American. .    GFR, Est Non African American 102 > OR = 60 mL/min/1.53m   GFR, Est African American 119 > OR = 60 mL/min/1.723m  BUN/Creatinine Ratio NOT APPLICABLE 6 - 22 (calc)   Sodium 136 135 - 146 mmol/L   Potassium 4.4 3.5 - 5.3 mmol/L   Chloride 99 98 - 110 mmol/L   CO2 29 20 - 32 mmol/L   Calcium 9.5 8.6 - 10.3 mg/dL   Total Protein 6.5 6.1 - 8.1 g/dL   Albumin 4.2 3.6 - 5.1 g/dL   Globulin 2.3 1.9 - 3.7 g/dL (calc)   AG Ratio 1.8 1.0 - 2.5 (calc)   Total Bilirubin 0.4 0.2 - 1.2 mg/dL   Alkaline phosphatase (APISO) 71 40 - 115 U/L   AST 13 10 - 35 U/L   ALT 19 9 - 46 U/L  Hemoglobin A1c     Status: Abnormal   Collection Time: 11/30/17 12:41 PM  Result Value Ref Range   Hgb A1c MFr Bld 10.9 (H) <5.7 % of total Hgb    Comment: For someone without known diabetes, a hemoglobin A1c value of 6.5% or greater indicates that they may have  diabetes and this should be confirmed with a follow-up  test. . For someone with known diabetes, a value <7% indicates  that their diabetes is well controlled and a value  greater than or equal to 7% indicates suboptimal  control. A1c targets should be individualized based on  duration of diabetes, age, comorbid conditions, and  other considerations. . Currently, no consensus exists regarding use of hemoglobin A1c for diagnosis of diabetes for children. .    Mean Plasma Glucose 266 (calc)   eAG (mmol/L) 14.7 (calc)  Basic metabolic panel     Status: Abnormal   Collection Time: 12/10/17 11:20 PM  Result Value Ref Range   Sodium 139 135 - 145 mmol/L   Potassium 3.5 3.5 - 5.1 mmol/L   Chloride 100 (L) 101 - 111 mmol/L   CO2 27 22 - 32 mmol/L   Glucose, Bld 221 (H) 65 - 99 mg/dL   BUN 19 6 - 20 mg/dL   Creatinine, Ser 0.89 0.61 - 1.24 mg/dL    Calcium 10.2 8.9 - 10.3 mg/dL   GFR calc non Af Amer >60 >60 mL/min   GFR calc Af Amer >60 >60 mL/min    Comment: (NOTE) The eGFR has been calculated using the CKD EPI equation. This calculation  has not been validated in all clinical situations. eGFR's persistently <60 mL/min signify possible Chronic Kidney Disease.    Anion gap 12 5 - 15  CBC     Status: None   Collection Time: 12/10/17 11:20 PM  Result Value Ref Range   WBC 7.2 4.0 - 10.5 K/uL   RBC 5.21 4.22 - 5.81 MIL/uL   Hemoglobin 15.2 13.0 - 17.0 g/dL   HCT 45.7 39.0 - 52.0 %   MCV 87.7 78.0 - 100.0 fL   MCH 29.2 26.0 - 34.0 pg   MCHC 33.3 30.0 - 36.0 g/dL   RDW 12.8 11.5 - 15.5 %   Platelets 246 150 - 400 K/uL  Troponin I     Status: None   Collection Time: 12/10/17 11:20 PM  Result Value Ref Range   Troponin I <0.03 <0.03 ng/mL  Troponin I     Status: None   Collection Time: 12/11/17  1:56 AM  Result Value Ref Range   Troponin I <0.03 <0.03 ng/mL      Lipid Panel ( most recent) Lipid Panel     Component Value Date/Time   CHOL 230 (H) 01/28/2013 1243   TRIG 146.0 01/28/2013 1243   HDL 42.50 01/28/2013 1243   CHOLHDL 5 01/28/2013 1243   VLDL 29.2 01/28/2013 1243   LDLDIRECT 153.4 01/28/2013 1243     Assessment & Plan:   1. Uncontrolled type 2 diabetes mellitus with hyperglycemia ([place]) - Patient has currently uncontrolled symptomatic type 2 DM since  57 years of age. - His repeat recent labs show A1c of 10.9%. He came with still significant hyperglycemia, he stayed off of his insulin. -his diabetes is complicated by obesity/sedentary  life and MCKENNA BORUFF remains at a high risk for more acute and chronic complications which include CAD, CVA, CKD, retinopathy, and neuropathy. These are all discussed in detail with the patient.  - I have counseled him on diet management and weight loss, by adopting a carbohydrate restricted/protein rich diet.  -  Suggestion is made for him to avoid simple carbohydrates   from his diet including Cakes, Sweet Desserts / Pastries, Ice Cream, Soda (diet and regular), Sweet Tea, Candies, Chips, Cookies, Store Bought Juices, Alcohol in Excess of  1-2 drinks a day, Artificial Sweeteners, and "Sugar-free" Products. This will help patient to have stable blood glucose profile and potentially avoid unintended weight gain.  - I encouraged him to switch to  unprocessed or minimally processed complex starch and increased protein intake (animal or plant source), fruits, and vegetables.  - he is advised to stick to a routine mealtimes to eat 3 meals  a day and avoid unnecessary snacks ( to snack only to correct hypoglycemia).   - he has been scheduled with Jearld Fenton, RDN, CDE for individualized DM education- consult pending.  - I have approached him with the following individualized plan to manage diabetes and patient agrees:   -  He did not tolerate metformin, he is too anxious to take insulin.  His EAG is 200-245. - he is alarmingly non-compliant to recommendations. - Given his current glycemic burden with A1c of 10.9%,  he would need intensive insulin therapy, but he declined. - Once again, he is approached for some form of therapy, he reluctantly accepts Januvia 59m po qam. This is not by any means adequate therapy for his diabetes.  -Patient is encouraged to continue monitoring glucose every morning before breakfast, and  Call clinic if fasting readings are  above  200 mg /dl.  - Patient specific target  A1c;  LDL, HDL, Triglycerides, and  Waist Circumference were discussed in detail.  2) BP/HTN: His blood pressure is controlled to target. He is not on any medications, wishes to avoid medications. 3) Lipids/HPL: Uncontrolled with LDL 153.  He says he does not tolerate Zocor.  4)  Weight/Diet: CDE Consult has been initiated and pending , exercise, and detailed carbohydrates information provided.  5) Chronic Care/Health Maintenance:  -he  Is not  on ACEI/ARB and  Statin medications ( wishes to avoid these medications)  and  is encouraged to continue to follow up with Ophthalmology, Dentist,  Podiatrist at least yearly or according to recommendations, and advised to  stay away from smoking. I have recommended yearly flu vaccine and pneumonia vaccination at least every 5 years; moderate intensity exercise for up to 150 minutes weekly; and  sleep for at least 7 hours a day.  - Time spent with the patient: 25 min, of which >50% was spent in reviewing his blood glucose logs , discussing his hypo- and hyper-glycemic episodes, reviewing his current and  previous labs and insulin doses and developing a plan to avoid hypo- and hyper-glycemia. Please refer to Patient Instructions for Blood Glucose Monitoring and Insulin/Medications Dosing Guide"  in media tab for additional information.   - I advised patient to seek a provider for PCP for primary care needs, and Dr. Sallyanne Kuster for cardiology needs.  Follow up plan: - Return in about 3 months (around 03/13/2018) for meter, and logs.  Glade Lloyd, MD Phone: [phone]  Fax: [phone]   12/13/2017, 10:54 AM This note was partially dictated with voice recognition software. Similar sounding words can be transcribed inadequately or may not  be corrected upon review.

## 2017-12-19 DIAGNOSIS — R300 Dysuria: Secondary | ICD-10-CM | POA: Diagnosis not present

## 2018-03-13 ENCOUNTER — Ambulatory Visit: Payer: BLUE CROSS/BLUE SHIELD | Admitting: "Endocrinology

## 2018-03-27 ENCOUNTER — Emergency Department (HOSPITAL_COMMUNITY)
Admission: EM | Admit: 2018-03-27 | Discharge: 2018-03-27 | Disposition: A | Discharge status: Home / Self Care | Payer: BLUE CROSS/BLUE SHIELD | Attending: Emergency Medicine | Admitting: Emergency Medicine

## 2018-03-27 ENCOUNTER — Encounter (HOSPITAL_COMMUNITY): Payer: Self-pay | Admitting: Emergency Medicine

## 2018-03-27 ENCOUNTER — Other Ambulatory Visit: Payer: Self-pay

## 2018-03-27 DIAGNOSIS — E119 Type 2 diabetes mellitus without complications: Secondary | ICD-10-CM | POA: Insufficient documentation

## 2018-03-27 DIAGNOSIS — R197 Diarrhea, unspecified: Secondary | ICD-10-CM

## 2018-03-27 DIAGNOSIS — Z87891 Personal history of nicotine dependence: Secondary | ICD-10-CM | POA: Insufficient documentation

## 2018-03-27 DIAGNOSIS — A09 Infectious gastroenteritis and colitis, unspecified: Secondary | ICD-10-CM | POA: Diagnosis not present

## 2018-03-27 LAB — COMPREHENSIVE METABOLIC PANEL
ALK PHOS: 57 U/L (ref 38–126)
ALT: 24 U/L (ref 17–63)
ANION GAP: 8 (ref 5–15)
AST: 14 U/L — ABNORMAL LOW (ref 15–41)
Albumin: 4.2 g/dL (ref 3.5–5.0)
BILIRUBIN TOTAL: 0.9 mg/dL (ref 0.3–1.2)
BUN: 14 mg/dL (ref 6–20)
CALCIUM: 9.4 mg/dL (ref 8.9–10.3)
CO2: 28 mmol/L (ref 22–32)
CREATININE: 0.77 mg/dL (ref 0.61–1.24)
Chloride: 100 mmol/L — ABNORMAL LOW (ref 101–111)
GFR calc Af Amer: >60 mL/min (ref >60)
Glucose, Bld: 164 mg/dL — ABNORMAL HIGH (ref 65–99)
Potassium: 4.1 mmol/L (ref 3.5–5.1)
SODIUM: 136 mmol/L (ref 135–145)
Total Protein: 7.2 g/dL (ref 6.5–8.1)

## 2018-03-27 LAB — CBC
HCT: 44.5 % (ref 39.0–52.0)
HEMOGLOBIN: 14.6 g/dL (ref 13.0–17.0)
MCH: 28.9 pg (ref 26.0–34.0)
MCHC: 32.8 g/dL (ref 30.0–36.0)
MCV: 88.1 fL (ref 78.0–100.0)
PLATELETS: 225 10*3/uL (ref 150–400)
RBC: 5.05 MIL/uL (ref 4.22–5.81)
RDW: 12.9 % (ref 11.5–15.5)
WBC: 6.7 10*3/uL (ref 4.0–10.5)

## 2018-03-27 LAB — LIPASE, BLOOD: Lipase: 22 U/L (ref 11–51)

## 2018-03-27 MED ORDER — SODIUM CHLORIDE 0.9 % IV BOLUS
1000.0000 mL | Freq: Once | INTRAVENOUS | Status: AC
  Administered 2018-03-27: 1000 mL via INTRAVENOUS

## 2018-03-27 NOTE — ED Triage Notes (Signed)
Pt c/o diarrhea that started today. Pt states he has went to the restroom x 8-10 times. Pt also c/o body aches.

## 2018-03-27 NOTE — Discharge Instructions (Addendum)
Drink plenty of fluids, you can take over-the-counter medication such as Imodium as needed for the diarrhea

## 2018-03-27 NOTE — ED Provider Notes (Signed)
[facility] EMERGENCY DEPARTMENT Provider Note   CSN: [id number] Arrival date & time: 03/27/18  1859     History   Chief Complaint Chief Complaint  Patient presents with  . Diarrhea    HPI Jacob Acosta is a 57 y.o. male.   Diarrhea   This is a new problem. The current episode started 12 to 24 hours ago. The problem occurs 5 to 10 times per day. The problem has not changed since onset.The stool consistency is described as watery. There has been no fever. Pertinent negatives include no abdominal pain, no vomiting, no chills, no URI and no cough. He has tried nothing for the symptoms. The treatment provided no relief. His past medical history does not include irritable bowel syndrome or inflammatory bowel disease.  Pt's wife was concerned because pt started to feel like he was going to pass out.  She checked his blood pressure and it was actually reading high.    denies any foreign travel.  No recent antibiotic use  Past Medical History:  Diagnosis Date  . Anxiety   . High cholesterol     Patient Active Problem List   Diagnosis Date Noted  . Personal history of noncompliance with medical treatment, presenting hazards to health 12/13/2017  . Uncontrolled type 2 diabetes mellitus with hyperglycemia ([facility]) 08/30/2017  . Mixed hyperlipidemia 08/30/2017  . Physical exam, annual 01/28/2013  . Overweight(278.02) 01/28/2013  . History of nephrolithiasis 01/28/2013  . Tendonitis of ankle or foot 01/28/2013    Past Surgical History:  Procedure Laterality Date  . dental sugery     at age 16  . TONSILLECTOMY  1980        Home Medications    Prior to Admission medications   Medication Sig Start Date End Date Taking? Authorizing Provider  aspirin 81 MG tablet Take 162 mg by mouth daily.     [provider]  Insulin Pen Needle (B-D ULTRAFINE III SHORT PEN) 31G X 8 MM MISC 1 each by Does not apply route as directed. 12/07/17   Roma Kayser, MD  metoprolol  succinate (TOPROL XL) 25 MG 24 hr tablet Take 1 tablet (25 mg total) by mouth daily. Patient not taking: Reported on 08/30/2017 06/01/17   Croitoru, Mihai, MD  naproxen sodium (ANAPROX) 220 MG tablet Take 220 mg by mouth 2 (two) times daily with a meal.    [provider]  Omega-3 Fatty Acids (FISH OIL) 1200 MG CAPS Take by mouth.    [provider]  simvastatin (ZOCOR) 40 MG tablet Take 1 tablet (40 mg total) by mouth at bedtime. Patient not taking: Reported on 08/30/2017 01/30/13   Michele Mcalpine, MD  sitaGLIPtin (JANUVIA) 50 MG tablet Take 1 tablet (50 mg total) by mouth daily. 12/13/17   Roma Kayser, MD    Family History Family History  Problem Relation Age of Onset  . Anxiety disorder Mother   . Depression Father   . Alcohol abuse Father     Social History Social History   Tobacco Use  . Smoking status: Former Smoker    Types: Cigarettes  . Smokeless tobacco: Never Used  . Tobacco comment: only smoked occasionally  Substance Use Topics  . Alcohol use: No  . Drug use: No     Allergies   Ciprofloxacin hcl; Codeine; and Penicillins   Review of Systems Review of Systems  Constitutional: Negative for chills.  Respiratory: Negative for cough.   Gastrointestinal: Positive for diarrhea. Negative  for abdominal pain and vomiting.  All other systems reviewed and are negative.    Physical Exam Updated Vital Signs BP (!) 135/98 (BP Location: Right Arm)   Pulse 90   Temp 98 F (36.7 C) (Oral)   Resp 12   Ht 1.702 m ( )   Wt 91.6 kg (202 lb)   SpO2 97%   BMI 31.64 kg/m   Physical Exam  Constitutional: He appears well-developed and well-nourished. No distress.  HENT:  Head: Normocephalic and atraumatic.  Right Ear: External ear normal.  Left Ear: External ear normal.  Eyes: Conjunctivae are normal. Right eye exhibits no discharge. Left eye exhibits no discharge. No scleral icterus.  Neck: Neck supple. No tracheal deviation present.    Cardiovascular: Normal rate, regular rhythm and intact distal pulses.  Pulmonary/Chest: Effort normal and breath sounds normal. No stridor. No respiratory distress. He has no wheezes. He has no rales.  Abdominal: Soft. Bowel sounds are normal. He exhibits no distension. There is no tenderness. There is no rebound and no guarding.  Musculoskeletal: He exhibits no edema or tenderness.  Neurological: He is alert. He has normal strength. No cranial nerve deficit (no facial droop, extraocular movements intact, no slurred speech) or sensory deficit. He exhibits normal muscle tone. He displays no seizure activity. Coordination normal.  Skin: Skin is warm and dry. No rash noted.  Psychiatric: He has a normal mood and affect.  Nursing note and vitals reviewed.    ED Treatments / Results  Labs (all labs ordered are listed, but only abnormal results are displayed) Labs Reviewed  COMPREHENSIVE METABOLIC PANEL - Abnormal; Notable for the following components:      Result Value   Chloride 100 (*)    Glucose, Bld 164 (*)    AST 14 (*)    All other components within normal limits  LIPASE, BLOOD  CBC     Procedures Procedures (including critical care time)  Medications Ordered in ED Medications  sodium chloride 0.9 % bolus 1,000 mL (has no administration in time range)     Initial Impression / Assessment and Plan / ED Course  I have reviewed the triage vital signs and the nursing notes.  Pertinent labs & imaging results that were available during my care of the patient were reviewed by me and considered in my medical decision making (see chart for details).  Clinical Course as of Mar 27 2141  Tue Mar 27, 2018  2142 Laboratory tests are reassuring.  No signs of serious dehydration.  No leukocytosis   [JK]    Clinical Course User Index [JK] Linwood Dibbles, MD  Patient presented to the emergency room for evaluation of diarrhea.  Patient was also experiencing some body aches but no known  fevers.  Patient has no abdominal tenderness.  Suspect a viral illness.  Patient was given a liter of IV fluids.  Discussed use of Imodium as needed.  Final Clinical Impressions(s) / ED Diagnoses   Final diagnoses:  Diarrhea of presumed infectious origin    ED Discharge Orders    None       Linwood Dibbles, MD 03/27/18 2142

## 2018-07-27 DIAGNOSIS — R300 Dysuria: Secondary | ICD-10-CM | POA: Diagnosis not present

## 2018-07-27 DIAGNOSIS — R8290 Unspecified abnormal findings in urine: Secondary | ICD-10-CM | POA: Diagnosis not present

## 2018-11-02 DIAGNOSIS — R8290 Unspecified abnormal findings in urine: Secondary | ICD-10-CM | POA: Diagnosis not present

## 2018-11-04 DIAGNOSIS — R8290 Unspecified abnormal findings in urine: Secondary | ICD-10-CM | POA: Diagnosis not present

## 2019-01-18 DIAGNOSIS — E1165 Type 2 diabetes mellitus with hyperglycemia: Secondary | ICD-10-CM | POA: Diagnosis not present

## 2019-01-18 DIAGNOSIS — E669 Obesity, unspecified: Secondary | ICD-10-CM | POA: Diagnosis not present

## 2019-01-18 DIAGNOSIS — R030 Elevated blood-pressure reading, without diagnosis of hypertension: Secondary | ICD-10-CM | POA: Diagnosis not present

## 2019-01-21 DIAGNOSIS — E1165 Type 2 diabetes mellitus with hyperglycemia: Secondary | ICD-10-CM | POA: Diagnosis not present

## 2019-01-21 DIAGNOSIS — R030 Elevated blood-pressure reading, without diagnosis of hypertension: Secondary | ICD-10-CM | POA: Diagnosis not present

## 2019-01-21 DIAGNOSIS — E669 Obesity, unspecified: Secondary | ICD-10-CM | POA: Diagnosis not present

## 2019-01-21 IMAGING — DX DG CHEST 2V
2 series · 2 of 2 positions shown · non-contrast
Comparison: Chest radiograph 12/16/2013

CLINICAL DATA: Chest pain

EXAM:
CHEST  2 VIEW

[chest pa]
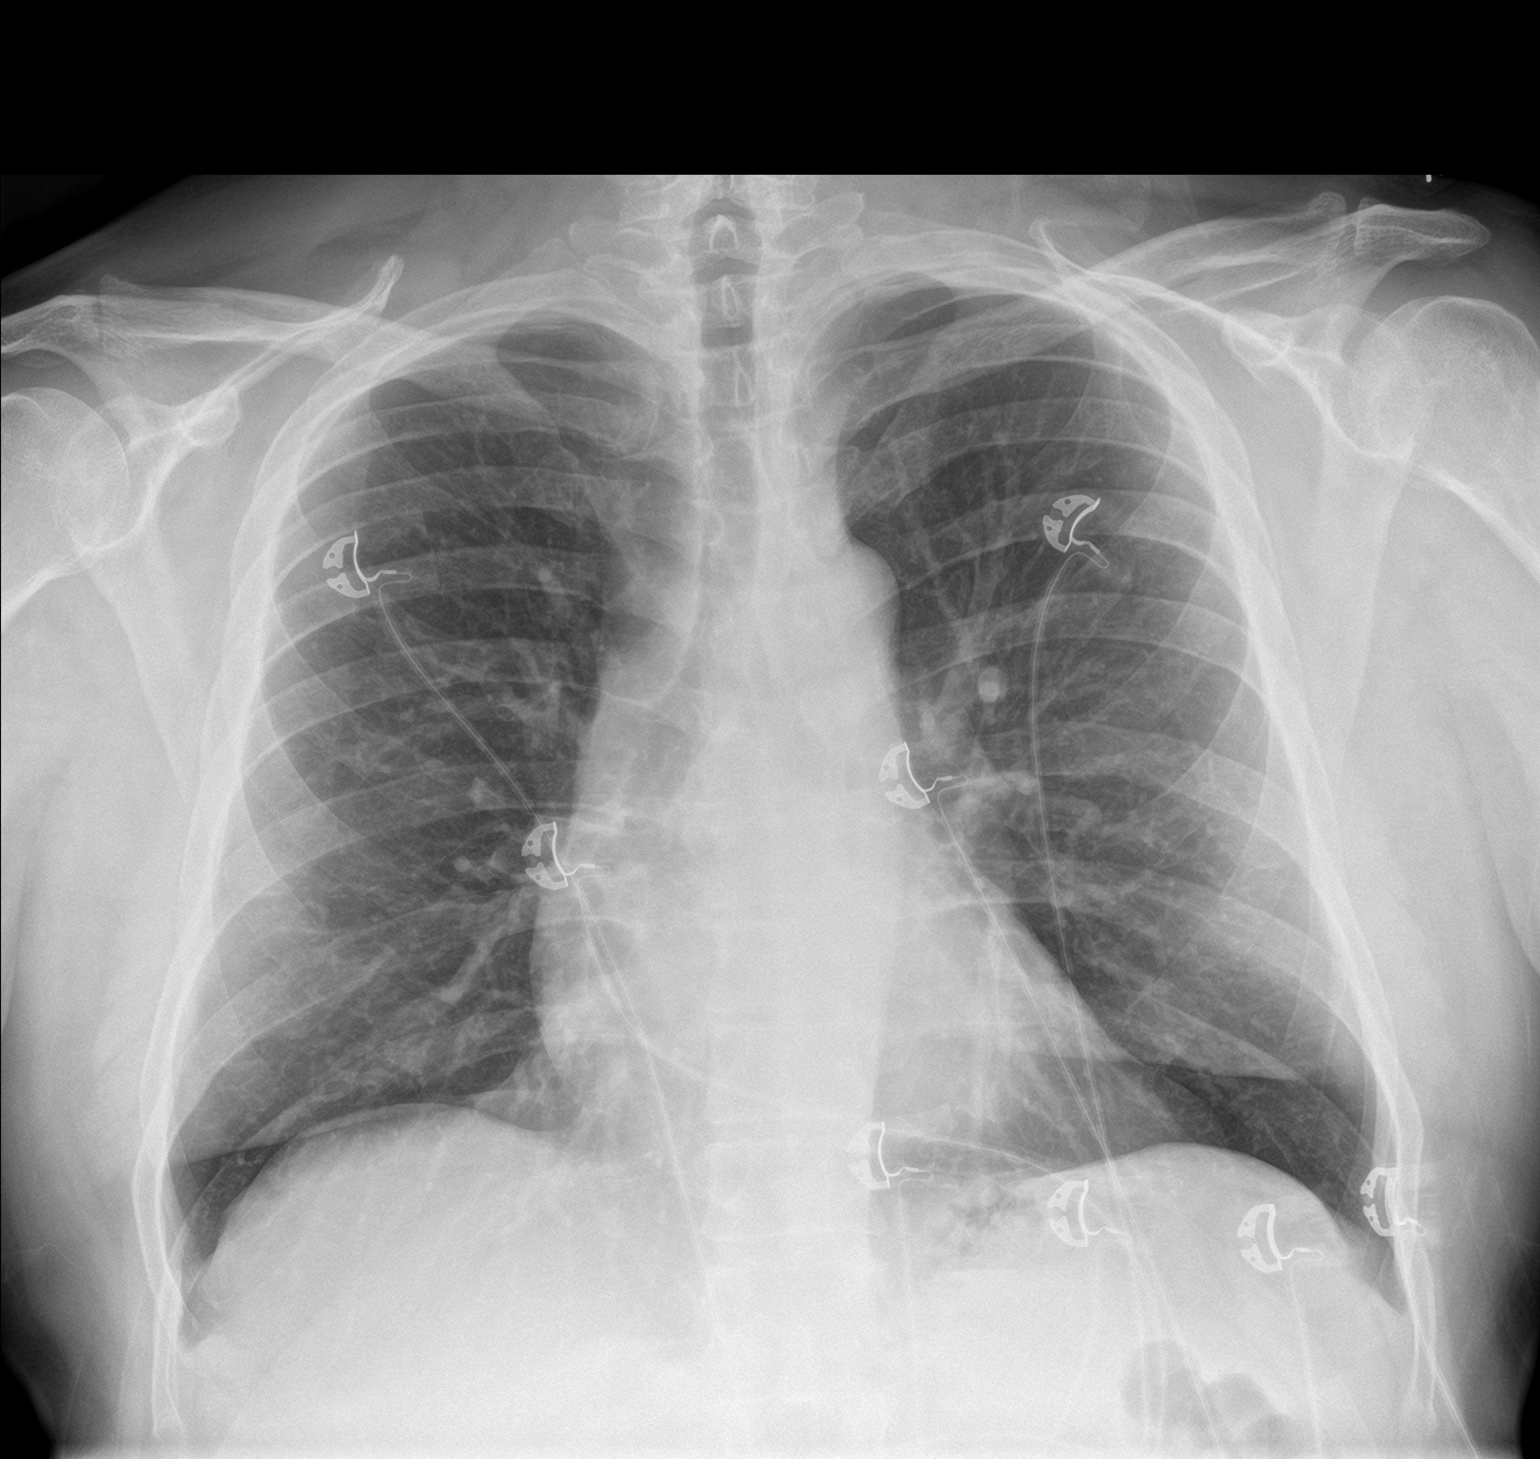

[chest lat]
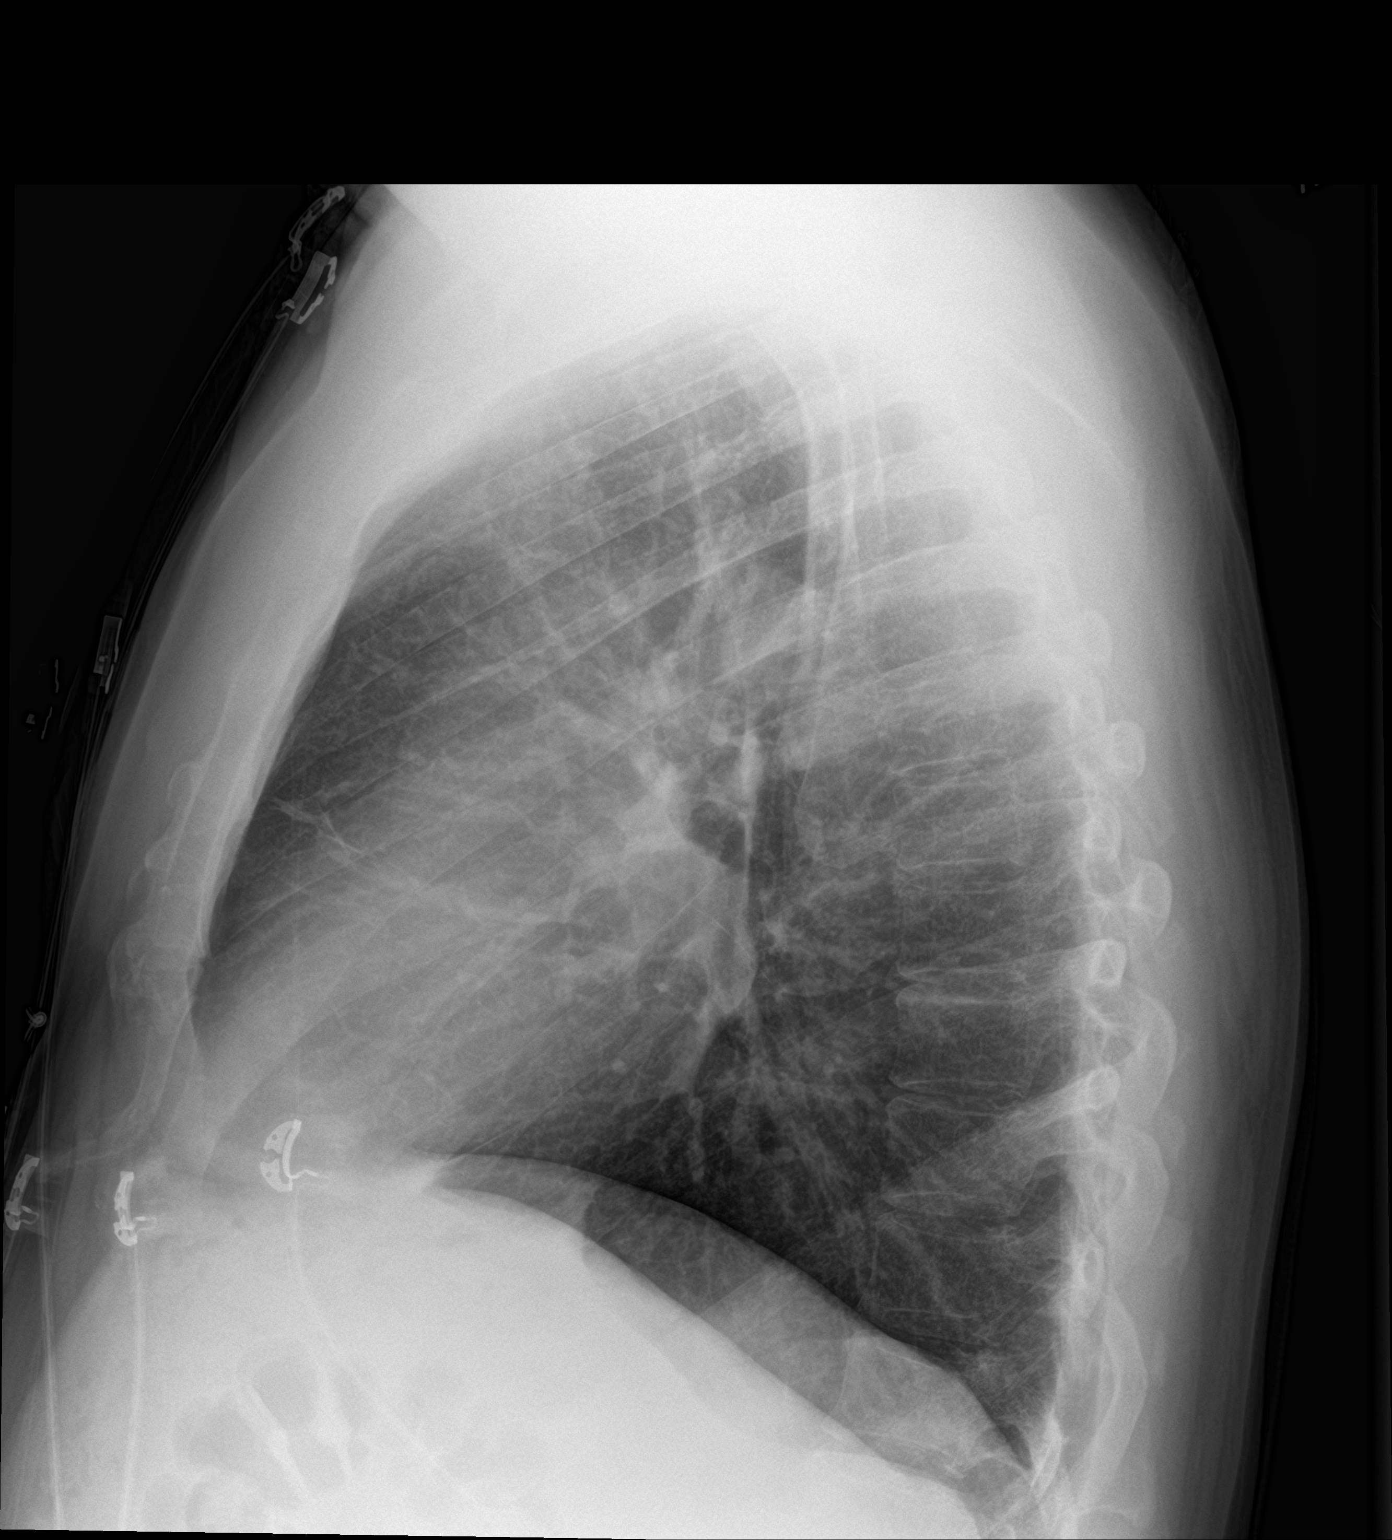

[2 of 2 positions shown; findings below may reference images not displayed]

FINDINGS: The heart size and mediastinal contours are within normal limits.
Both lungs are clear. The visualized skeletal structures are
unremarkable.
IMPRESSION: No active cardiopulmonary disease.

## 2019-01-23 DIAGNOSIS — Z0000 Encounter for general adult medical examination without abnormal findings: Secondary | ICD-10-CM | POA: Diagnosis not present

## 2019-01-23 DIAGNOSIS — E1165 Type 2 diabetes mellitus with hyperglycemia: Secondary | ICD-10-CM | POA: Diagnosis not present

## 2019-01-23 DIAGNOSIS — E669 Obesity, unspecified: Secondary | ICD-10-CM | POA: Diagnosis not present

## 2019-01-23 DIAGNOSIS — E785 Hyperlipidemia, unspecified: Secondary | ICD-10-CM | POA: Diagnosis not present

## 2019-02-06 ENCOUNTER — Other Ambulatory Visit: Payer: Self-pay

## 2019-02-06 MED ORDER — METOPROLOL SUCCINATE ER 25 MG PO TB24: 25.0000 mg | ORAL_TABLET | Freq: Every day | ORAL | 3 refills | Status: DC

## 2019-02-08 DIAGNOSIS — E1165 Type 2 diabetes mellitus with hyperglycemia: Secondary | ICD-10-CM | POA: Diagnosis not present

## 2019-02-15 DIAGNOSIS — E1165 Type 2 diabetes mellitus with hyperglycemia: Secondary | ICD-10-CM | POA: Diagnosis not present

## 2019-03-30 DIAGNOSIS — M79641 Pain in right hand: Secondary | ICD-10-CM | POA: Diagnosis not present

## 2019-05-30 DIAGNOSIS — E785 Hyperlipidemia, unspecified: Secondary | ICD-10-CM | POA: Diagnosis not present

## 2019-05-30 DIAGNOSIS — E1165 Type 2 diabetes mellitus with hyperglycemia: Secondary | ICD-10-CM | POA: Diagnosis not present

## 2019-06-07 DIAGNOSIS — E669 Obesity, unspecified: Secondary | ICD-10-CM | POA: Diagnosis not present

## 2019-06-07 DIAGNOSIS — E1165 Type 2 diabetes mellitus with hyperglycemia: Secondary | ICD-10-CM | POA: Diagnosis not present

## 2019-06-07 DIAGNOSIS — Z6832 Body mass index (BMI) 32.0-32.9, adult: Secondary | ICD-10-CM | POA: Diagnosis not present

## 2019-06-07 DIAGNOSIS — E782 Mixed hyperlipidemia: Secondary | ICD-10-CM | POA: Diagnosis not present

## 2019-08-30 DIAGNOSIS — N390 Urinary tract infection, site not specified: Secondary | ICD-10-CM | POA: Diagnosis not present

## 2019-11-18 ENCOUNTER — Ambulatory Visit
Admission: EM | Admit: 2019-11-18 | Discharge: 2019-11-18 | Disposition: A | Discharge status: Home / Self Care | Payer: BC Managed Care – PPO | Attending: Emergency Medicine | Admitting: Emergency Medicine

## 2019-11-18 ENCOUNTER — Other Ambulatory Visit: Payer: Self-pay

## 2019-11-18 DIAGNOSIS — R81 Glycosuria: Secondary | ICD-10-CM | POA: Diagnosis not present

## 2019-11-18 DIAGNOSIS — E0865 Diabetes mellitus due to underlying condition with hyperglycemia: Secondary | ICD-10-CM | POA: Diagnosis not present

## 2019-11-18 DIAGNOSIS — R300 Dysuria: Secondary | ICD-10-CM | POA: Diagnosis not present

## 2019-11-18 DIAGNOSIS — R110 Nausea: Secondary | ICD-10-CM

## 2019-11-18 DIAGNOSIS — Z794 Long term (current) use of insulin: Secondary | ICD-10-CM | POA: Diagnosis not present

## 2019-11-18 DIAGNOSIS — E1165 Type 2 diabetes mellitus with hyperglycemia: Secondary | ICD-10-CM | POA: Diagnosis not present

## 2019-11-18 DIAGNOSIS — R350 Frequency of micturition: Secondary | ICD-10-CM

## 2019-11-18 HISTORY — DX: Type 2 diabetes mellitus without complications: E11.9

## 2019-11-18 LAB — POCT URINALYSIS DIP (MANUAL ENTRY)
Bilirubin, UA: NEGATIVE
Blood, UA: NEGATIVE
Glucose, UA: >=1000 mg/dL — AB
Ketones, POC UA: NEGATIVE mg/dL
Leukocytes, UA: NEGATIVE
Nitrite, UA: NEGATIVE
Protein Ur, POC: NEGATIVE mg/dL
Spec Grav, UA: 1.02 (ref 1.010–1.025)
Urobilinogen, UA: 0.2 E.U./dL
pH, UA: 6 (ref 5.0–8.0)

## 2019-11-18 LAB — POCT FASTING CBG KUC MANUAL ENTRY: POCT Glucose (KUC): 288 mg/dL — AB (ref 70–99)

## 2019-11-18 NOTE — Discharge Instructions (Addendum)
Offered further evaluation and management in the ED for elevated glucose with history of diabetes.  Patient declines and would like to try outpatient therapy first.   Urine did not sign of infection.   Urine culture sent.  We will call you with abnormal results.   Push fluids (water) over the next 24-48 hours Symptoms most likely secondary to elevated glucose Continue to monitor glucose daily and after meals.  It may be helpful to begin keeping a log of your blood sugars to show to your PCP Continue with AZO as needed for symptomatic relief Continue with insulin as prescribed. Follow up with PCP at scheduled appointment for further evaluation and management Return here or go to ER if you have any new or worsening symptoms such as fever, chills, sweating, abdominal pain, nausea/vomiting, flank pain, increased urination, increased thirst, if symptoms do not improve over the next 24-48 hours with fluids an diet changes, etc..Marland Kitchen

## 2019-11-18 NOTE — ED Triage Notes (Signed)
Pt presents to UC w/ c/o burning on urination x2. Pt states he took azo today which helped w/ abd pain and back pain relief.

## 2019-11-18 NOTE — ED Provider Notes (Signed)
[facility]   CC: Burning with urination  SUBJECTIVE:  Jacob Acosta is a 59 y.o. male who complains of dysuria x 2 weeks.  Does admit to poor diet choices over the holiday and increased caffeine.  Denies abdominal or back pain.  Has tried OTC AZO with relief.  Symptoms are made worse with urination.  Denies similar symptoms.  Admits to associated urinary frequency, urgency, increased thirst, and nausea. Denies fever, chills, vomiting, abdominal pain, flank pain, hematuria.    ROS: As in HPI.  All other pertinent ROS negative.     Past Medical History:  Diagnosis Date  . Anxiety   . Diabetes mellitus without complication ([place])   . High cholesterol    Past Surgical History:  Procedure Laterality Date  . dental sugery     at age 2  . TONSILLECTOMY  1980   Allergies  Allergen Reactions  . Ciprofloxacin Hcl Other (See Comments)    hallucination  . Codeine Other (See Comments)    Causes him to feel very anxious  . Metformin And Related Nausea Only and Palpitations  . Penicillins Rash    Has patient had a PCN reaction causing immediate rash, facial/tongue/throat swelling, SOB or lightheadedness with hypotension: Yes Has patient had a PCN reaction causing severe rash involving mucus membranes or skin necrosis: No Has patient had a PCN reaction that required hospitalization: No Has patient had a PCN reaction occurring within the last 10 years: Yes If all of the above answers are "NO", then may proceed with Cephalosporin use.   Marland Kitchen Toujeo Max Solostar [Insulin Glargine] Itching, Nausea Only and Palpitations    Itching throat   No current facility-administered medications on file prior to encounter.   Current Outpatient Medications on File Prior to Encounter  Medication Sig Dispense Refill  . aspirin 81 MG tablet Take 162 mg by mouth daily.     Marland Kitchen BETA CAROTENE PO Take 1 tablet by mouth daily.    Marland Kitchen KRILL OIL PO Take 1 capsule by mouth daily.    . Multiple  Vitamins-Minerals (CENTRUM SILVER 50+MEN PO) Take 1 tablet by mouth daily.    . vitamin C (ASCORBIC ACID) 500 MG tablet Take 1,000 mg by mouth daily.    . [DISCONTINUED] metoprolol succinate (TOPROL XL) 25 MG 24 hr tablet Take 1 tablet (25 mg total) by mouth daily. 90 tablet 3   Social History   Socioeconomic History  . Marital status: Married    Spouse name: Advertising account executive  . Number of children: 0  . Years of education: Not on file  . Highest education level: Not on file  Occupational History  . Occupation: drives delivery truck  Tobacco Use  . Smoking status: Former Smoker    Types: Cigarettes  . Smokeless tobacco: Never Used  . Tobacco comment: only smoked occasionally  Substance and Sexual Activity  . Alcohol use: No  . Drug use: No  . Sexual activity: Not on file  Other Topics Concern  . Not on file  Social History Narrative  . Not on file   Social Determinants of Health   Financial Resource Strain:   . Difficulty of Paying Living Expenses: Not on file  Food Insecurity:   . Worried About Charity fundraiser in the Last Year: Not on file  . Ran Out of Food in the Last Year: Not on file  Transportation Needs:   . Lack of Transportation (Medical): Not on file  . Lack of Transportation (  Non-Medical): Not on file  Physical Activity:   . Days of Exercise per Week: Not on file  . Minutes of Exercise per Session: Not on file  Stress:   . Feeling of Stress : Not on file  Social Connections:   . Frequency of Communication with Friends and Family: Not on file  . Frequency of Social Gatherings with Friends and Family: Not on file  . Attends Religious Services: Not on file  . Active Member of Clubs or Organizations: Not on file  . Attends Banker Meetings: Not on file  . Marital Status: Not on file  Intimate Partner Violence:   . Fear of Current or Ex-Partner: Not on file  . Emotionally Abused: Not on file  . Physically Abused: Not on file  . Sexually Abused:  Not on file   Family History  Problem Relation Age of Onset  . Anxiety disorder Mother   . Depression Father   . Alcohol abuse Father     OBJECTIVE:  Vitals:   11/18/19 1506  BP: (!) 168/85  Pulse: 72  Resp: 16  Temp: (!) 97.5 F (36.4 C)  TempSrc: Oral  SpO2: 96%   General appearance: Alert in no acute distress HEENT: NCAT.  PERRL, EOMI grossly; Oropharynx clear.  Lungs: clear to auscultation bilaterally without adventitious breath sounds Heart: regular rate and rhythm.   Abdomen: soft; non-distended; no tenderness; bowel sounds present; no guarding Back: no CVA tenderness Extremities: no edema; symmetrical with no gross deformities Skin: warm and dry Neurologic: Ambulates from chair to exam table without difficulty Psychological: alert and cooperative; normal mood and affect  Labs Reviewed  POCT URINALYSIS DIP (MANUAL ENTRY) - Abnormal; Notable for the following components:      Result Value   Glucose, UA >=1,000 (*)    All other components within normal limits  POCT FASTING CBG [facility] MANUAL ENTRY - Abnormal; Notable for the following components:   POCT Glucose ([facility]) 288 (*)    All other components within normal limits  URINE CULTURE    ASSESSMENT & PLAN:  1. Dysuria   2. Glucosuria   3. Diabetes mellitus due to underlying condition with hyperglycemia, with long-term current use of insulin (HCC)    Offered further evaluation and management in the ED for elevated glucose with history of diabetes.  Patient declines and would like to try outpatient therapy first.   Urine did not sign of infection.   Urine culture sent.  We will call you with abnormal results.   Push fluids (water) over the next 24-48 hours Symptoms most likely secondary to elevated glucose Continue to monitor glucose daily and after meals.  It may be helpful to begin keeping a log of your blood sugars to show to your PCP Continue with AZO as needed for symptomatic relief Continue with insulin as  prescribed. Follow up with PCP at scheduled appointment for further evaluation and management Return here or go to ER if you have any new or worsening symptoms such as fever, chills, sweating, abdominal pain, nausea/vomiting, flank pain, increased urination, increased thirst, if symptoms do not improve over the next 24-48 hours with fluids an diet changes, etc...  Outlined signs and symptoms indicating need for more acute intervention. Patient verbalized understanding. After Visit Summary given.     Rennis Harding, PA-C 11/18/19 1550

## 2019-11-19 LAB — URINE CULTURE: Culture: NO GROWTH

## 2019-11-22 DIAGNOSIS — R3915 Urgency of urination: Secondary | ICD-10-CM | POA: Diagnosis not present

## 2020-02-27 ENCOUNTER — Ambulatory Visit
Admission: EM | Admit: 2020-02-27 | Discharge: 2020-02-27 | Disposition: A | Discharge status: Home / Self Care | Payer: PRIVATE HEALTH INSURANCE | Attending: Emergency Medicine | Admitting: Emergency Medicine

## 2020-02-27 ENCOUNTER — Other Ambulatory Visit: Payer: Self-pay

## 2020-02-27 DIAGNOSIS — M79644 Pain in right finger(s): Secondary | ICD-10-CM

## 2020-02-27 DIAGNOSIS — S6991XA Unspecified injury of right wrist, hand and finger(s), initial encounter: Secondary | ICD-10-CM

## 2020-02-27 DIAGNOSIS — Z23 Encounter for immunization: Secondary | ICD-10-CM

## 2020-02-27 MED ORDER — TETANUS-DIPHTH-ACELL PERTUSSIS 5-2.5-18.5 LF-MCG/0.5 IM SUSP
0.5000 mL | Freq: Once | INTRAMUSCULAR | Status: AC
  Administered 2020-02-27: 11:00:00 0.5 mL via INTRAMUSCULAR

## 2020-02-27 NOTE — Discharge Instructions (Signed)
Tetanus immunization was given Advised to follow-up with PCP Return or go to ED for new or worsening symptoms.

## 2020-02-27 NOTE — ED Provider Notes (Addendum)
[facility]    CSN: [id number] Arrival date & time: 02/27/20  1031      History   Chief Complaint Chief Complaint  Patient presents with  . Finger Injury    HPI Jacob Acosta is a 59 y.o. male.   Who presented to the urgent care with a complaint of tip of right middle finger injury for the past few days.  Report lawnmower blade cut his finger creating a small wound.  Denies any  worsening factor.  Reports symptom has been getting better and is here to receive a tetanus shot.  Last tetanus immunization was unknown.  Denies chills, denies fever denies nausea denies vomiting denies body aches denies diarrhea.  The history is provided by the patient. No language interpreter was used.    Past Medical History:  Diagnosis Date  . Anxiety   . Diabetes mellitus without complication ([place])   . High cholesterol     Patient Active Problem List   Diagnosis Date Noted  . Personal history of noncompliance with medical treatment, presenting hazards to health 12/13/2017  . Uncontrolled type 2 diabetes mellitus with hyperglycemia ([place]) 08/30/2017  . Mixed hyperlipidemia 08/30/2017  . Physical exam, annual 01/28/2013  . Overweight(278.02) 01/28/2013  . History of nephrolithiasis 01/28/2013  . Tendonitis of ankle or foot 01/28/2013    Past Surgical History:  Procedure Laterality Date  . dental sugery     at age 63  . TONSILLECTOMY  1980       Home Medications    Prior to Admission medications   Medication Sig Start Date End Date Taking? Authorizing Provider  aspirin 81 MG tablet Take 162 mg by mouth daily.     [provider]  BETA CAROTENE PO Take 1 tablet by mouth daily.    [provider]  KRILL OIL PO Take 1 capsule by mouth daily.    [provider]  Multiple Vitamins-Minerals (CENTRUM SILVER 50+MEN PO) Take 1 tablet by mouth daily.    [provider]  vitamin C (ASCORBIC ACID) 500 MG tablet Take 1,000 mg by mouth daily.     [provider]  metoprolol succinate (TOPROL XL) 25 MG 24 hr tablet Take 1 tablet (25 mg total) by mouth daily. 02/06/19 11/18/19  Croitoru, Dani Gobble, MD    Family History Family History  Problem Relation Age of Onset  . Anxiety disorder Mother   . Depression Father   . Alcohol abuse Father     Social History Social History   Tobacco Use  . Smoking status: Former Smoker    Types: Cigarettes  . Smokeless tobacco: Never Used  . Tobacco comment: only smoked occasionally  Substance Use Topics  . Alcohol use: No  . Drug use: No     Allergies   Ciprofloxacin hcl, Codeine, Metformin and related, Penicillins, and Toujeo max solostar [insulin glargine]   Review of Systems Review of Systems  Constitutional: Negative.   Respiratory: Negative.   Cardiovascular: Negative.   Skin: Positive for wound.  All other systems reviewed and are negative.    Physical Exam Triage Vital Signs ED Triage Vitals  Enc Vitals Group     BP 02/27/20 1042 (!) 150/88     Pulse Rate 02/27/20 1042 71     Resp 02/27/20 1042 17     Temp 02/27/20 1042 97.8 F (36.6 C)     Temp src --      SpO2 02/27/20 1042 96 %     Weight --  Height --      Head Circumference --      Peak Flow --      Pain Score 02/27/20 1040 0     Pain Loc --      Pain Edu? --      Excl. in GC? --    No data found.  Updated Vital Signs BP (!) 150/88   Pulse 71   Temp 97.8 F (36.6 C)   Resp 17   SpO2 96%   Visual Acuity Right Eye Distance:   Left Eye Distance:   Bilateral Distance:    Right Eye Near:   Left Eye Near:    Bilateral Near:     Physical Exam Vitals and nursing note reviewed.  Constitutional:      General: He is not in acute distress.    Appearance: Normal appearance. He is normal weight. He is not ill-appearing, toxic-appearing or diaphoretic.  Cardiovascular:     Rate and Rhythm: Normal rate and regular rhythm.     Pulses: Normal pulses.     Heart sounds: Normal heart sounds. No  murmur. No friction rub. No gallop.   Pulmonary:     Effort: Pulmonary effort is normal. No respiratory distress.     Breath sounds: Normal breath sounds. No stridor. No wheezing, rhonchi or rales.  Chest:     Chest wall: No tenderness.  Musculoskeletal:        General: Signs of injury present. No swelling, tenderness or deformity.     Right lower leg: No edema.     Left lower leg: No edema.  Skin:    General: Skin is warm.     Capillary Refill: Capillary refill takes less than 2 seconds.     Findings: Wound present. No abscess or lesion.  Neurological:     Mental Status: He is alert.      UC Treatments / Results  Labs (all labs ordered are listed, but only abnormal results are displayed) Labs Reviewed - No data to display  EKG   Radiology No results found.  Procedures Procedures (including critical care time)  Medications Ordered in UC Medications  Tdap (BOOSTRIX) injection 0.5 mL (has no administration in time range)    Initial Impression / Assessment and Plan / UC Course  I have reviewed the triage vital signs and the nursing notes.  Pertinent labs & imaging results that were available during my care of the patient were reviewed by me and considered in my medical decision making (see chart for details).    Patient is stable at discharge.  Tetanus immunization was given in office.  Was advised to follow-up with PCP.  Return or go to ED for worsening symptoms.  Final Clinical Impressions(s) / UC Diagnoses   Final diagnoses:  Injury of right middle finger, initial encounter  Finger pain, right     Discharge Instructions     Tetanus immunization was given Advised to follow-up with PCP Return or go to ED for new or worsening symptoms.    ED Prescriptions    None     PDMP not reviewed this encounter.      Durward Parcel, FNP 02/27/20 1100

## 2020-02-27 NOTE — ED Triage Notes (Signed)
Pt presents with small injury to finger on right hand from lawnmower blade , wants tetanus shot

## 2020-06-24 ENCOUNTER — Encounter: Payer: Self-pay | Admitting: Emergency Medicine

## 2020-06-24 ENCOUNTER — Other Ambulatory Visit: Payer: Self-pay

## 2020-06-24 ENCOUNTER — Ambulatory Visit
Admission: EM | Admit: 2020-06-24 | Discharge: 2020-06-24 | Disposition: A | Discharge status: Home / Self Care | Payer: PRIVATE HEALTH INSURANCE | Attending: Emergency Medicine | Admitting: Emergency Medicine

## 2020-06-24 ENCOUNTER — Ambulatory Visit
Admission: EM | Admit: 2020-06-24 | Discharge: 2020-06-24 | Discharge status: Absent without leave | Payer: PRIVATE HEALTH INSURANCE

## 2020-06-24 DIAGNOSIS — J0140 Acute pansinusitis, unspecified: Secondary | ICD-10-CM

## 2020-06-24 MED ORDER — CLINDAMYCIN HCL 300 MG PO CAPS: 300.0000 mg | ORAL_CAPSULE | Freq: Four times a day (QID) | ORAL | 0 refills | Status: AC

## 2020-06-24 NOTE — Discharge Instructions (Signed)
Declines COVID test today Get plenty of rest and push fluids Clindamycin prescribed for possible sinus infection.  Use as directed and to completion Use OTC zyrtec for nasal congestion, runny nose, and/or sore throat Use OTC flonase for nasal congestion and runny nose Use medications daily for symptom relief Use OTC medications like ibuprofen or tylenol as needed fever or pain Call or go to the ED if you have any new or worsening symptoms such as fever, cough, shortness of breath, chest tightness, chest pain, turning blue, changes in mental status, etc..Marland Kitchen

## 2020-06-24 NOTE — ED Triage Notes (Signed)
Pt feels like his LT ear is clogged, states he tried to flush it out with peroxide with no relief

## 2020-06-24 NOTE — ED Provider Notes (Signed)
[facility]   [id number] 06/24/20 Arrival Time: 1417   CC: COVID symptoms  SUBJECTIVE: History from: patient.  Jacob Acosta is a 59 y.o. male who presents with runny nose, sinus congestion, pressure, drainage, and LT ear feeling clogged x 5 days.  Denies sick exposure to COVID, flu or strep.  Denies recent travel.  Denies alleviating or aggravating factors.  Reports previous symptoms in the past with sinus infection.   Denies fever, chills, SOB, wheezing, chest pain, nausea, changes in bowel or bladder habits.    ROS: As per HPI.  All other pertinent ROS negative.     Past Medical History:  Diagnosis Date  . Anxiety   . Diabetes mellitus without complication (HCC)   . High cholesterol    Past Surgical History:  Procedure Laterality Date  . dental sugery     at age 52  . TONSILLECTOMY  1980   Allergies  Allergen Reactions  . Ciprofloxacin Hcl Other (See Comments)    hallucination  . Codeine Other (See Comments)    Causes him to feel very anxious  . Metformin And Related Nausea Only and Palpitations  . Penicillins Rash    Has patient had a PCN reaction causing immediate rash, facial/tongue/throat swelling, SOB or lightheadedness with hypotension: Yes Has patient had a PCN reaction causing severe rash involving mucus membranes or skin necrosis: No Has patient had a PCN reaction that required hospitalization: No Has patient had a PCN reaction occurring within the last 10 years: Yes If all of the above answers are "NO", then may proceed with Cephalosporin use.   Marland Kitchen Toujeo Max Solostar [Insulin Glargine] Itching, Nausea Only and Palpitations    Itching throat   No current facility-administered medications on file prior to encounter.   Current Outpatient Medications on File Prior to Encounter  Medication Sig Dispense Refill  . aspirin 81 MG tablet Take 162 mg by mouth daily.     Marland Kitchen BETA CAROTENE PO Take 1 tablet by mouth daily.    Marland Kitchen KRILL OIL PO Take 1 capsule by  mouth daily.    . Multiple Vitamins-Minerals (CENTRUM SILVER 50+MEN PO) Take 1 tablet by mouth daily.    . vitamin C (ASCORBIC ACID) 500 MG tablet Take 1,000 mg by mouth daily.    . [DISCONTINUED] metoprolol succinate (TOPROL XL) 25 MG 24 hr tablet Take 1 tablet (25 mg total) by mouth daily. 90 tablet 3   Social History   Socioeconomic History  . Marital status: Married    Spouse name: Academic librarian  . Number of children: 0  . Years of education: Not on file  . Highest education level: Not on file  Occupational History  . Occupation: drives delivery truck  Tobacco Use  . Smoking status: Former Smoker    Types: Cigarettes  . Smokeless tobacco: Never Used  . Tobacco comment: only smoked occasionally  Vaping Use  . Vaping Use: Never used  Substance and Sexual Activity  . Alcohol use: No  . Drug use: No  . Sexual activity: Not on file  Other Topics Concern  . Not on file  Social History Narrative  . Not on file   Social Determinants of Health   Financial Resource Strain:   . Difficulty of Paying Living Expenses:   Food Insecurity:   . Worried About Programme researcher, broadcasting/film/video in the Last Year:   . [employer] of Food in the Last Year:   Transportation Needs:   . Lack of  Transportation (Medical):   Marland Kitchen Lack of Transportation (Non-Medical):   Physical Activity:   . Days of Exercise per Week:   . Minutes of Exercise per Session:   Stress:   . Feeling of Stress :   Social Connections:   . Frequency of Communication with Friends and Family:   . Frequency of Social Gatherings with Friends and Family:   . Attends Religious Services:   . Active Member of Clubs or Organizations:   . Attends Banker Meetings:   Marland Kitchen Marital Status:   Intimate Partner Violence:   . Fear of Current or Ex-Partner:   . Emotionally Abused:   Marland Kitchen Physically Abused:   . Sexually Abused:    Family History  Problem Relation Age of Onset  . Anxiety disorder Mother   . Depression Father   . Alcohol  abuse Father     OBJECTIVE:  Vitals:   06/24/20 1423 06/24/20 1424  BP:  (!) 141/77  Pulse:  74  Resp:  18  Temp:  97.8 F (36.6 C)  TempSrc:  Oral  SpO2:  96%  Weight: 200 lb 9.9 oz (91 kg)   Height: 5\' 7"  (1.702 m)      General appearance: alert; appears mildly fatigued, but nontoxic; speaking in full sentences and tolerating own secretions HEENT: NCAT; Ears: EACs clear, TMs pearly gray; Eyes: PERRL.  EOM grossly intact. Sinuses: TTP; Nose: nares patent without rhinorrhea, Throat: oropharynx clear, tonsils non erythematous or enlarged, uvula midline  Neck: supple without LAD Lungs: unlabored respirations, symmetrical air entry; cough: absent; no respiratory distress; CTAB Heart: regular rate and rhythm.   Skin: warm and dry Psychological: alert and cooperative; normal mood and affect   ASSESSMENT & PLAN:  1. Acute non-recurrent pansinusitis     Meds ordered this encounter  Medications  . clindamycin (CLEOCIN) 300 MG capsule    Sig: Take 1 capsule (300 mg total) by mouth every 6 (six) hours for 10 days.    Dispense:  40 capsule    Refill:  0    Order Specific Question:   Supervising Provider    Answer:   Eustace Moore   Declines COVID test today Get plenty of rest and push fluids Clindamycin prescribed for possible sinus infection.  Use as directed and to completion Use OTC zyrtec for nasal congestion, runny nose, and/or sore throat Use OTC flonase for nasal congestion and runny nose Use medications daily for symptom relief Use OTC medications like ibuprofen or tylenol as needed fever or pain Call or go to the ED if you have any new or worsening symptoms such as fever, cough, shortness of breath, chest tightness, chest pain, turning blue, changes in mental status, etc...   Reviewed expectations re: course of current medical issues. Questions answered. Outlined signs and symptoms indicating need for more acute intervention. Patient verbalized  understanding. After Visit Summary given.         [[id number]], PA-C 06/24/20 1451

## 2020-12-21 ENCOUNTER — Other Ambulatory Visit: Payer: Self-pay

## 2020-12-21 ENCOUNTER — Ambulatory Visit
Admission: EM | Admit: 2020-12-21 | Discharge: 2020-12-21 | Disposition: A | Discharge status: Home / Self Care | Payer: PRIVATE HEALTH INSURANCE | Attending: Emergency Medicine | Admitting: Emergency Medicine

## 2020-12-21 DIAGNOSIS — R8290 Unspecified abnormal findings in urine: Secondary | ICD-10-CM

## 2020-12-21 DIAGNOSIS — R109 Unspecified abdominal pain: Secondary | ICD-10-CM | POA: Diagnosis not present

## 2020-12-21 DIAGNOSIS — R300 Dysuria: Secondary | ICD-10-CM | POA: Diagnosis not present

## 2020-12-21 LAB — POCT URINALYSIS DIP (MANUAL ENTRY)
Bilirubin, UA: NEGATIVE
Blood, UA: NEGATIVE
Glucose, UA: NEGATIVE mg/dL
Ketones, POC UA: NEGATIVE mg/dL
Leukocytes, UA: NEGATIVE
Nitrite, UA: NEGATIVE
Protein Ur, POC: NEGATIVE mg/dL
Spec Grav, UA: 1.015 (ref 1.010–1.025)
Urobilinogen, UA: 0.2 E.U./dL
pH, UA: 5 (ref 5.0–8.0)

## 2020-12-21 MED ORDER — FLUCONAZOLE 150 MG PO TABS: 150.0000 mg | ORAL_TABLET | Freq: Once | ORAL | 0 refills | Status: AC

## 2020-12-21 NOTE — Discharge Instructions (Signed)
Urine culture sent.  We will call you with the results.   Push fluids and get plenty of rest.  Diflucan was prescribed/take as directed Continue to take Azo as needed for dysuria Follow up with PCP if symptoms persists Return here or go to ER if you have any new or worsening symptoms such as fever, worsening abdominal pain, nausea/vomiting, flank pain, etc..Marland Kitchen

## 2020-12-21 NOTE — ED Provider Notes (Signed)
[facility]   Chief Complaint  Patient presents with  . UTI     SUBJECTIVE:  Jacob Acosta is a 60 y.o. male with history of diabetes and recurrent UTI presented to the urgent care with a complaint abdominal pain, dysuria and cloudy urine for the past few days.  Patient denies a precipitating event, recent sexual encounter, excessive caffeine intake.  Localizes the pain to the lower abdomen.  Pain is intermittent described as achy.  Has tried OTC medications without relief.  Symptoms are made worse with urination.  Admits to similar symptoms in the past.  Denies fever, chills, nausea, vomiting, abdominal pain, flank pain, abnormal penile discharge or bleeding, hematuria.    LMP: No LMP for male patient.  ROS: As in HPI.  All other pertinent ROS negative.     Past Medical History:  Diagnosis Date  . Anxiety   . Diabetes mellitus without complication (HCC)   . High cholesterol    Past Surgical History:  Procedure Laterality Date  . dental sugery     at age 77  . TONSILLECTOMY  1980   Allergies  Allergen Reactions  . Ciprofloxacin Hcl Other (See Comments)    hallucination  . Codeine Other (See Comments)    Causes him to feel very anxious  . Metformin And Related Nausea Only and Palpitations  . Penicillins Rash    Has patient had a PCN reaction causing immediate rash, facial/tongue/throat swelling, SOB or lightheadedness with hypotension: Yes Has patient had a PCN reaction causing severe rash involving mucus membranes or skin necrosis: No Has patient had a PCN reaction that required hospitalization: No Has patient had a PCN reaction occurring within the last 10 years: Yes If all of the above answers are "NO", then may proceed with Cephalosporin use.   Marland Kitchen Toujeo Max Solostar [Insulin Glargine] Itching, Nausea Only and Palpitations    Itching throat   No current facility-administered medications on file prior to encounter.   Current Outpatient Medications on File  Prior to Encounter  Medication Sig Dispense Refill  . aspirin 81 MG tablet Take 162 mg by mouth daily.     Marland Kitchen BETA CAROTENE PO Take 1 tablet by mouth daily.    . Insulin Glargine (BASAGLAR KWIKPEN) 100 UNIT/ML Inject 20 Units into the skin at bedtime.    Marland Kitchen KRILL OIL PO Take 1 capsule by mouth daily.    . Multiple Vitamins-Minerals (CENTRUM SILVER 50+MEN PO) Take 1 tablet by mouth daily.    . pravastatin (PRAVACHOL) 10 MG tablet Take 10 mg by mouth at bedtime.    . vitamin C (ASCORBIC ACID) 500 MG tablet Take 1,000 mg by mouth daily.    . [DISCONTINUED] metoprolol succinate (TOPROL XL) 25 MG 24 hr tablet Take 1 tablet (25 mg total) by mouth daily. 90 tablet 3   Social History   Socioeconomic History  . Marital status: Married    Spouse name: Academic librarian  . Number of children: 0  . Years of education: Not on file  . Highest education level: Not on file  Occupational History  . Occupation: drives delivery truck  Tobacco Use  . Smoking status: Former Smoker    Types: Cigarettes  . Smokeless tobacco: Never Used  . Tobacco comment: only smoked occasionally  Vaping Use  . Vaping Use: Never used  Substance and Sexual Activity  . Alcohol use: No  . Drug use: No  . Sexual activity: Not on file  Other Topics Concern  .  Not on file  Social History Narrative  . Not on file   Social Determinants of Health   Financial Resource Strain: Not on file  Food Insecurity: Not on file  Transportation Needs: Not on file  Physical Activity: Not on file  Stress: Not on file  Social Connections: Not on file  Intimate Partner Violence: Not on file   Family History  Problem Relation Age of Onset  . Anxiety disorder Mother   . Depression Father   . Alcohol abuse Father     OBJECTIVE:  Vitals:   12/21/20 1302  BP: 133/85  Pulse: 71  Resp: 16  Temp: 97.6 F (36.4 C)  TempSrc: Oral  SpO2: 96%   General appearance: AOx3 in no acute distress HEENT: NCAT.  Oropharynx clear.  Lungs:  clear to auscultation bilaterally without adventitious breath sounds Heart: regular rate and rhythm.  Radial pulses 2+ symmetrical bilaterally Abdomen: soft; non-distended; no tenderness; bowel sounds present; no guarding or rebound tenderness Back: no CVA tenderness Extremities: no edema; symmetrical with no gross deformities Skin: warm and dry Neurologic: Ambulates from chair to exam table without difficulty Psychological: alert and cooperative; normal mood and affect  Labs Reviewed  URINE CULTURE  POCT URINALYSIS DIP (MANUAL ENTRY)    ASSESSMENT & PLAN:  1. Dysuria   2. Abdominal pain, unspecified abdominal location   3. Cloudy urine     Meds ordered this encounter  Medications  . fluconazole (DIFLUCAN) 150 MG tablet    Sig: Take 1 tablet (150 mg total) by mouth once for 1 dose. May take the second dose 72 hours after the first if symptom does not resolve    Dispense:  2 tablet    Refill:  0   Patient is stable at discharge.  He stated he has only 1 partner and is married therefore declined penile swab.  With history of diabetes more likely his symptom could be from yeast.  Will prescribe Diflucan.  Will await urine culture  Discharge instructions  Urine culture sent.  We will call you with the results.   Push fluids and get plenty of rest.  Diflucan was prescribed/take as directed Continue to take Azo as needed for dysuria Follow up with PCP if symptoms persists Return here or go to ER if you have any new or worsening symptoms such as fever, worsening abdominal pain, nausea/vomiting, flank pain, etc...  Outlined signs and symptoms indicating need for more acute intervention. Patient verbalized understanding. After Visit Summary given.     Durward Parcel, FNP 12/21/20 1346

## 2020-12-21 NOTE — ED Triage Notes (Signed)
Patient presents to Urgent Care with complaints of abdominal pain, dysuria, cloudiness to color. Pt states he has a hx of UTIs.  Denies fever or hematuria.

## 2020-12-23 LAB — URINE CULTURE: Culture: NO GROWTH

## 2021-08-02 ENCOUNTER — Ambulatory Visit
Admission: EM | Admit: 2021-08-02 | Discharge: 2021-08-02 | Disposition: A | Discharge status: Home / Self Care | Payer: PRIVATE HEALTH INSURANCE | Attending: Family Medicine | Admitting: Family Medicine

## 2021-08-02 ENCOUNTER — Other Ambulatory Visit: Payer: Self-pay

## 2021-08-02 ENCOUNTER — Encounter: Payer: Self-pay | Admitting: Emergency Medicine

## 2021-08-02 DIAGNOSIS — R300 Dysuria: Secondary | ICD-10-CM | POA: Diagnosis not present

## 2021-08-02 DIAGNOSIS — N390 Urinary tract infection, site not specified: Secondary | ICD-10-CM | POA: Insufficient documentation

## 2021-08-02 LAB — POCT URINALYSIS DIP (MANUAL ENTRY)
Bilirubin, UA: NEGATIVE
Glucose, UA: 100 mg/dL — AB
Leukocytes, UA: NEGATIVE
Nitrite, UA: POSITIVE — AB
Protein Ur, POC: NEGATIVE mg/dL
Spec Grav, UA: 1.02 (ref 1.010–1.025)
Urobilinogen, UA: 1 E.U./dL
pH, UA: 5 (ref 5.0–8.0)

## 2021-08-02 MED ORDER — CEPHALEXIN 500 MG PO CAPS
500.0000 mg | ORAL_CAPSULE | Freq: Two times a day (BID) | ORAL | 0 refills | Status: DC
Start: 2021-08-02 — End: 2021-12-26

## 2021-08-02 NOTE — Discharge Instructions (Signed)
Because you have tolerated Keflex in the past, I do not anticipate an allergic reaction to occur but watch closely for signs of a developing reaction given your history of penicillin allergy.  Stop the medication immediately and seek immediate care if having any sort of reaction.

## 2021-08-02 NOTE — ED Triage Notes (Signed)
Burning on urination, lower abd pain.  States he gets UTIs frequently.

## 2021-08-02 NOTE — ED Provider Notes (Signed)
[facility]    CSN: [id number] Arrival date & time: 08/02/21  1254      History   Chief Complaint No chief complaint on file.   HPI Jacob Acosta is a 60 y.o. male.   Patient presenting today with 1 to 2-day history of worsening dysuria, lower abdominal bloating and soreness, urinary frequency.  Denies fever, chills, nausea, vomiting, significant back pain, prostate tenderness.  So far using Azo with as needed relief.  History of frequent urinary tract infections that always start similarly.  No new sexual partners or concern for sexual transmitted infections.   Past Medical History:  Diagnosis Date   Anxiety    Diabetes mellitus without complication (HCC)    High cholesterol     Patient Active Problem List   Diagnosis Date Noted   Personal history of noncompliance with medical treatment, presenting hazards to health 12/13/2017   Uncontrolled type 2 diabetes mellitus with hyperglycemia (HCC) 08/30/2017   Mixed hyperlipidemia 08/30/2017   Physical exam, annual 01/28/2013   Overweight(278.02) 01/28/2013   History of nephrolithiasis 01/28/2013   Tendonitis of ankle or foot 01/28/2013    Past Surgical History:  Procedure Laterality Date   dental sugery     at age 63   TONSILLECTOMY  56       Home Medications    Prior to Admission medications   Medication Sig Start Date End Date Taking? Authorizing Provider  cephALEXin (KEFLEX) 500 MG capsule Take 1 capsule (500 mg total) by mouth 2 (two) times daily. 08/02/21  Yes Particia Nearing, PA-C  aspirin 81 MG tablet Take 162 mg by mouth daily.     [provider]  BETA CAROTENE PO Take 1 tablet by mouth daily.    [provider]  Insulin Glargine (BASAGLAR KWIKPEN) 100 UNIT/ML Inject 20 Units into the skin at bedtime. 12/19/20   [provider]  KRILL OIL PO Take 1 capsule by mouth daily.    [provider]  Multiple Vitamins-Minerals (CENTRUM SILVER 50+MEN PO) Take 1  tablet by mouth daily.    [provider]  pravastatin (PRAVACHOL) 10 MG tablet Take 10 mg by mouth at bedtime. 11/01/20   [provider]  vitamin C (ASCORBIC ACID) 500 MG tablet Take 1,000 mg by mouth daily.    [provider]  metoprolol succinate (TOPROL XL) 25 MG 24 hr tablet Take 1 tablet (25 mg total) by mouth daily. 02/06/19 11/18/19  Croitoru, Mihai, MD    Family History Family History  Problem Relation Age of Onset   Anxiety disorder Mother    Depression Father    Alcohol abuse Father     Social History Social History   Tobacco Use   Smoking status: Former    Types: Cigarettes   Smokeless tobacco: Never   Tobacco comments:    only smoked occasionally  Vaping Use   Vaping Use: Never used  Substance Use Topics   Alcohol use: No   Drug use: No     Allergies   Ciprofloxacin hcl, Codeine, Metformin and related, Penicillins, and Toujeo max solostar [insulin glargine]   Review of Systems Review of Systems Per HPI  Physical Exam Triage Vital Signs ED Triage Vitals  Enc Vitals Group     BP 08/02/21 1337 (!) 144/79     Pulse Rate 08/02/21 1337 76     Resp 08/02/21 1337 16     Temp 08/02/21 1337 97.9 F (36.6 C)     Temp  Source 08/02/21 1337 Oral     SpO2 08/02/21 1337 96 %     Weight --      Height --      Head Circumference --      Peak Flow --      Pain Score 08/02/21 1338 4     Pain Loc --      Pain Edu? --      Excl. in GC? --    No data found.  Updated Vital Signs BP (!) 144/79 (BP Location: Right Arm)   Pulse 76   Temp 97.9 F (36.6 C) (Oral)   Resp 16   SpO2 96%   Visual Acuity Right Eye Distance:   Left Eye Distance:   Bilateral Distance:    Right Eye Near:   Left Eye Near:    Bilateral Near:     Physical Exam Vitals and nursing note reviewed.  Constitutional:      Appearance: Normal appearance.  HENT:     Head: Atraumatic.     Mouth/Throat:     Mouth: Mucous membranes are moist.  Eyes:      Extraocular Movements: Extraocular movements intact.     Conjunctiva/sclera: Conjunctivae normal.  Cardiovascular:     Rate and Rhythm: Normal rate and regular rhythm.  Pulmonary:     Effort: Pulmonary effort is normal.     Breath sounds: Normal breath sounds.  Abdominal:     General: Bowel sounds are normal. There is no distension.     Palpations: Abdomen is soft.     Tenderness: There is no abdominal tenderness. There is no right CVA tenderness, left CVA tenderness or guarding.  Musculoskeletal:        General: Normal range of motion.     Cervical back: Normal range of motion and neck supple.  Skin:    General: Skin is warm and dry.  Neurological:     General: No focal deficit present.     Mental Status: He is oriented to person, place, and time.     Motor: No weakness.     Gait: Gait normal.  Psychiatric:        Mood and Affect: Mood normal.        Thought Content: Thought content normal.        Judgment: Judgment normal.     UC Treatments / Results  Labs (all labs ordered are listed, but only abnormal results are displayed) Labs Reviewed  POCT URINALYSIS DIP (MANUAL ENTRY) - Abnormal; Notable for the following components:      Result Value   Color, UA orange (*)    Glucose, UA =100 (*)    Ketones, POC UA trace (5) (*)    Blood, UA trace-intact (*)    Nitrite, UA Positive (*)    All other components within normal limits  URINE CULTURE    EKG   Radiology No results found.  Procedures Procedures (including critical care time)  Medications Ordered in UC Medications - No data to display  Initial Impression / Assessment and Plan / UC Course  I have reviewed the triage vital signs and the nursing notes.  Pertinent labs & imaging results that were available during my care of the patient were reviewed by me and considered in my medical decision making (see chart for details).     UA does show evidence of a urinary tract infection today, though patient has  recently taken Azo so unclear if this has interfered with the results.  Urine culture pending for further clarification and will start antibiotics in the meantime to treat empirically particular given his history of recurrent urinary tract infections.  He states he has had issues with multiple antibiotics in the past for urinary tract infections but always does well on Keflex, requesting to use this at this time.  Discussed the possible cross-reactivity given his penicillin allergy but he states he is never had an issue with this.  Did discuss at length that he needs to watch closely for any sort of allergic reaction while taking the medication.  Urine culture pending, will change if needed based on this.  Final Clinical Impressions(s) / UC Diagnoses   Final diagnoses:  Dysuria  Acute lower UTI     Discharge Instructions      Because you have tolerated Keflex in the past, I do not anticipate an allergic reaction to occur but watch closely for signs of a developing reaction given your history of penicillin allergy.  Stop the medication immediately and seek immediate care if having any sort of reaction.     ED Prescriptions     Medication Sig Dispense Auth. Provider   cephALEXin (KEFLEX) 500 MG capsule Take 1 capsule (500 mg total) by mouth 2 (two) times daily. 10 capsule Particia Nearing, [place]      PDMP not reviewed this encounter.   Particia Nearing, [place] 08/02/21 1437

## 2021-08-02 NOTE — ED Triage Notes (Signed)
Has been taken AZO since yesterday

## 2021-08-04 LAB — URINE CULTURE: Culture: NO GROWTH

## 2021-08-13 ENCOUNTER — Ambulatory Visit
Admission: EM | Admit: 2021-08-13 | Discharge: 2021-08-13 | Disposition: A | Discharge status: Home / Self Care | Payer: PRIVATE HEALTH INSURANCE | Attending: Internal Medicine | Admitting: Internal Medicine

## 2021-08-13 ENCOUNTER — Other Ambulatory Visit: Payer: Self-pay

## 2021-08-13 ENCOUNTER — Encounter: Payer: Self-pay | Admitting: Emergency Medicine

## 2021-08-13 DIAGNOSIS — N400 Enlarged prostate without lower urinary tract symptoms: Secondary | ICD-10-CM | POA: Diagnosis not present

## 2021-08-13 DIAGNOSIS — R300 Dysuria: Secondary | ICD-10-CM | POA: Diagnosis not present

## 2021-08-13 LAB — POCT URINALYSIS DIP (MANUAL ENTRY)
Bilirubin, UA: NEGATIVE
Blood, UA: NEGATIVE
Glucose, UA: NEGATIVE mg/dL
Ketones, POC UA: NEGATIVE mg/dL
Leukocytes, UA: NEGATIVE
Nitrite, UA: NEGATIVE
Protein Ur, POC: NEGATIVE mg/dL
Spec Grav, UA: 1.02 (ref 1.010–1.025)
Urobilinogen, UA: 0.2 E.U./dL
pH, UA: 6 (ref 5.0–8.0)

## 2021-08-13 MED ORDER — TAMSULOSIN HCL 0.4 MG PO CAPS: 0.4000 mg | ORAL_CAPSULE | Freq: Every day | ORAL | 2 refills | Status: DC

## 2021-08-13 MED ORDER — TAMSULOSIN HCL 0.4 MG PO CAPS: 0.4000 mg | ORAL_CAPSULE | Freq: Every day | ORAL | Status: DC

## 2021-08-13 NOTE — Discharge Instructions (Signed)
Please take medications as prescribed If his symptoms persist he will benefit from a urology visit Return to urgent care if you have any further concerns. We will call you with recommendations if your labs are abnormal.

## 2021-08-13 NOTE — ED Triage Notes (Signed)
Burning with urination , lower abd pain and increased urination.  Had this problem last time and clindamycin helped him and diflucan.  Finished abx recently for uti.

## 2021-08-13 NOTE — ED Provider Notes (Signed)
[facility]    CSN: [id number] Arrival date & time: 08/13/21  1137      History   Chief Complaint No chief complaint on file.   HPI Jacob Acosta is a 60 y.o. male comes to the urgent care with several days history of some burning sensation with urination, lower abdominal pain, increased frequency and nocturia.  No fever or chills.  No flank pain.  Patient has some hesitancy with micturition as well as feeling of incomplete bladder emptying.  Patient has been treated for urinary tract infection on several occasions in the past.  His urine cultures have been consistently negative.  Patient has not had any prostate evaluation in the past.  No weight loss.  No blood in urine. HPI  Past Medical History:  Diagnosis Date   Anxiety    Diabetes mellitus without complication (HCC)    High cholesterol     Patient Active Problem List   Diagnosis Date Noted   Personal history of noncompliance with medical treatment, presenting hazards to health 12/13/2017   Uncontrolled type 2 diabetes mellitus with hyperglycemia (HCC) 08/30/2017   Mixed hyperlipidemia 08/30/2017   Physical exam, annual 01/28/2013   Overweight(278.02) 01/28/2013   History of nephrolithiasis 01/28/2013   Tendonitis of ankle or foot 01/28/2013    Past Surgical History:  Procedure Laterality Date   dental sugery     at age 25   TONSILLECTOMY  28       Home Medications    Prior to Admission medications   Medication Sig Start Date End Date Taking? Authorizing Provider  tamsulosin (FLOMAX) 0.4 MG CAPS capsule Take 1 capsule (0.4 mg total) by mouth daily after supper. 08/13/21  Yes Ira Dougher, Britta Mccreedy, MD  aspirin 81 MG tablet Take 162 mg by mouth daily.     [provider]  BETA CAROTENE PO Take 1 tablet by mouth daily.    [provider]  cephALEXin (KEFLEX) 500 MG capsule Take 1 capsule (500 mg total) by mouth 2 (two) times daily. 08/02/21   Particia Nearing, PA-C  Insulin  Glargine [facility]) 100 UNIT/ML Inject 20 Units into the skin at bedtime. 12/19/20   [provider]  KRILL OIL PO Take 1 capsule by mouth daily.    [provider]  Multiple Vitamins-Minerals (CENTRUM SILVER 50+MEN PO) Take 1 tablet by mouth daily.    [provider]  pravastatin (PRAVACHOL) 10 MG tablet Take 10 mg by mouth at bedtime. 11/01/20   [provider]  vitamin C (ASCORBIC ACID) 500 MG tablet Take 1,000 mg by mouth daily.    [provider]  metoprolol succinate (TOPROL XL) 25 MG 24 hr tablet Take 1 tablet (25 mg total) by mouth daily. 02/06/19 11/18/19  Croitoru, Mihai, MD    Family History Family History  Problem Relation Age of Onset   Anxiety disorder Mother    Depression Father    Alcohol abuse Father     Social History Social History   Tobacco Use   Smoking status: Former    Types: Cigarettes   Smokeless tobacco: Never   Tobacco comments:    only smoked occasionally  Vaping Use   Vaping Use: Never used  Substance Use Topics   Alcohol use: No   Drug use: No     Allergies   Ciprofloxacin hcl, Codeine, Metformin and related, Penicillins, and Toujeo max solostar [insulin glargine]   Review of Systems Review of Systems  Constitutional: Negative.  Respiratory: Negative.    Gastrointestinal: Negative.   Genitourinary:  Positive for difficulty urinating, dysuria and urgency. Negative for enuresis, hematuria, penile pain, penile swelling and scrotal swelling.  Neurological: Negative.     Physical Exam Triage Vital Signs ED Triage Vitals  Enc Vitals Group     BP 08/13/21 1255 (!) 145/89     Pulse Rate 08/13/21 1255 64     Resp 08/13/21 1255 18     Temp 08/13/21 1255 98.5 F (36.9 C)     Temp Source 08/13/21 1255 Oral     SpO2 08/13/21 1255 96 %     Weight --      Height --      Head Circumference --      Peak Flow --      Pain Score 08/13/21 1258 3     Pain Loc --      Pain Edu? --      Excl. in  GC? --    No data found.  Updated Vital Signs BP (!) 145/89 (BP Location: Right Arm)   Pulse 64   Temp 98.5 F (36.9 C) (Oral)   Resp 18   SpO2 96%   Visual Acuity Right Eye Distance:   Left Eye Distance:   Bilateral Distance:    Right Eye Near:   Left Eye Near:    Bilateral Near:     Physical Exam Vitals reviewed.  Constitutional:      General: He is not in acute distress.    Appearance: He is not ill-appearing.  HENT:     Right Ear: Tympanic membrane normal.     Left Ear: Tympanic membrane normal.  Cardiovascular:     Rate and Rhythm: Normal rate and regular rhythm.     Pulses: Normal pulses.     Heart sounds: Normal heart sounds.  Pulmonary:     Effort: Pulmonary effort is normal.     Breath sounds: Normal breath sounds.  Neurological:     Mental Status: He is alert.     UC Treatments / Results  Labs (all labs ordered are listed, but only abnormal results are displayed) Labs Reviewed  URINE CULTURE  PSA  POCT URINALYSIS DIP (MANUAL ENTRY)    EKG   Radiology No results found.  Procedures Procedures (including critical care time)  Medications Ordered in UC Medications - No data to display  Initial Impression / Assessment and Plan / UC Course  I have reviewed the triage vital signs and the nursing notes.  Pertinent labs & imaging results that were available during my care of the patient were reviewed by me and considered in my medical decision making (see chart for details).     1.  Prostatism: Point-of-care urinalysis is negative for UTI PSA Tamsulosin orally daily If patient's symptoms does not improve, patient will benefit from urology evaluation. Final Clinical Impressions(s) / UC Diagnoses   Final diagnoses:  Dysuria  Prostatism     Discharge Instructions      Please take medications as prescribed If his symptoms persist he will benefit from a urology visit Return to urgent care if you have any further concerns. We will call  you with recommendations if your labs are abnormal.   ED Prescriptions     Medication Sig Dispense Auth. Provider   tamsulosin (FLOMAX) 0.4 MG CAPS capsule Take 1 capsule (0.4 mg total) by mouth daily after supper. 30 capsule Guilherme Schwenke, Britta Mccreedy, MD      PDMP not reviewed this  encounter.   Merrilee Jansky, MD 08/13/21 [phone]

## 2021-08-14 LAB — PSA: Prostate Specific Ag, Serum: 2.4 ng/mL (ref 0.0–4.0)

## 2021-08-15 LAB — URINE CULTURE
Culture: NO GROWTH
Special Requests: NORMAL

## 2021-12-01 DIAGNOSIS — E782 Mixed hyperlipidemia: Secondary | ICD-10-CM | POA: Diagnosis not present

## 2021-12-26 ENCOUNTER — Other Ambulatory Visit: Payer: Self-pay

## 2021-12-26 ENCOUNTER — Ambulatory Visit
Admission: EM | Admit: 2021-12-26 | Discharge: 2021-12-26 | Disposition: A | Discharge status: Home / Self Care | Payer: 59 | Attending: Family Medicine | Admitting: Family Medicine

## 2021-12-26 DIAGNOSIS — R300 Dysuria: Secondary | ICD-10-CM | POA: Diagnosis not present

## 2021-12-26 LAB — POCT URINALYSIS DIP (MANUAL ENTRY)
Bilirubin, UA: NEGATIVE
Blood, UA: NEGATIVE
Glucose, UA: 250 mg/dL — AB
Ketones, POC UA: NEGATIVE mg/dL
Leukocytes, UA: NEGATIVE
Nitrite, UA: NEGATIVE
Protein Ur, POC: NEGATIVE mg/dL
Spec Grav, UA: 1.015 (ref 1.010–1.025)
Urobilinogen, UA: 0.2 E.U./dL
pH, UA: 6 (ref 5.0–8.0)

## 2021-12-26 MED ORDER — CEPHALEXIN 500 MG PO CAPS: 500.0000 mg | ORAL_CAPSULE | Freq: Two times a day (BID) | ORAL | 0 refills | Status: DC

## 2021-12-26 NOTE — ED Provider Notes (Signed)
[facility]    CSN: [id number] Arrival date & time: 12/26/21  1137      History   Chief Complaint Chief Complaint  Patient presents with   Urinary Tract Infection    HPI Jacob Acosta is a 61 y.o. male.   Patient presenting today with about a week of dysuria, urinary frequency.  He states he has this issue frequently and it is usually attributed to a urinary or prostate infection.  He states he believes he is dealing with an enlarged prostate but is not currently on medicine for this.  Denies fever, chills, abdominal or pelvic pain, back pain, nausea, vomiting, diarrhea, concern for STDs.  Trying Azo with minimal relief.  States has had good benefit with antibiotics for this in the past.  Of note, patient is a type II diabetic on insulin.   Past Medical History:  Diagnosis Date   Anxiety    Diabetes mellitus without complication (HCC)    High cholesterol     Patient Active Problem List   Diagnosis Date Noted   Personal history of noncompliance with medical treatment, presenting hazards to health 12/13/2017   Uncontrolled type 2 diabetes mellitus with hyperglycemia (HCC) 08/30/2017   Mixed hyperlipidemia 08/30/2017   Physical exam, annual 01/28/2013   Overweight(278.02) 01/28/2013   History of nephrolithiasis 01/28/2013   Tendonitis of ankle or foot 01/28/2013    Past Surgical History:  Procedure Laterality Date   dental sugery     at age 53   TONSILLECTOMY  33       Home Medications    Prior to Admission medications   Medication Sig Start Date End Date Taking? Authorizing Provider  aspirin 81 MG tablet Take 162 mg by mouth daily.     [provider]  BETA CAROTENE PO Take 1 tablet by mouth daily.    [provider]  cephALEXin (KEFLEX) 500 MG capsule Take 1 capsule (500 mg total) by mouth 2 (two) times daily. 12/26/21   Particia Nearing, PA-C  Insulin Glargine [facility]) 100 UNIT/ML Inject 20 Units into the skin at  bedtime. 12/19/20   [provider]  KRILL OIL PO Take 1 capsule by mouth daily.    [provider]  Multiple Vitamins-Minerals (CENTRUM SILVER 50+MEN PO) Take 1 tablet by mouth daily.    [provider]  pravastatin (PRAVACHOL) 10 MG tablet Take 10 mg by mouth at bedtime. 11/01/20   [provider]  tamsulosin (FLOMAX) 0.4 MG CAPS capsule Take 1 capsule (0.4 mg total) by mouth daily after supper. 08/13/21   LampteyBritta Mccreedy, MD  vitamin C (ASCORBIC ACID) 500 MG tablet Take 1,000 mg by mouth daily.    [provider]  metoprolol succinate (TOPROL XL) 25 MG 24 hr tablet Take 1 tablet (25 mg total) by mouth daily. 02/06/19 11/18/19  Croitoru, Mihai, MD    Family History Family History  Problem Relation Age of Onset   Anxiety disorder Mother    Depression Father    Alcohol abuse Father     Social History Social History   Tobacco Use   Smoking status: Former    Types: Cigarettes   Smokeless tobacco: Never   Tobacco comments:    only smoked occasionally  Vaping Use   Vaping Use: Never used  Substance Use Topics   Alcohol use: No   Drug use: No     Allergies   Ciprofloxacin hcl, Codeine, Metformin and related, Penicillins, and Toujeo max  solostar [insulin glargine]   Review of Systems Review of Systems Per HPI  Physical Exam Triage Vital Signs ED Triage Vitals  Enc Vitals Group     BP 12/26/21 1354 (!) 147/82     Pulse Rate 12/26/21 1354 99     Resp 12/26/21 1354 20     Temp 12/26/21 1354 98.2 F (36.8 C)     Temp Source 12/26/21 1354 Oral     SpO2 12/26/21 1354 96 %     Weight --      Height --      Head Circumference --      Peak Flow --      Pain Score 12/26/21 1351 0     Pain Loc --      Pain Edu? --      Excl. in [place]? --    No data found.  Updated Vital Signs BP (!) 147/82 (BP Location: Right Arm)    Pulse 99    Temp 98.2 F (36.8 C) (Oral)    Resp 20    SpO2 96%   Visual Acuity Right Eye Distance:   Left Eye  Distance:   Bilateral Distance:    Right Eye Near:   Left Eye Near:    Bilateral Near:     Physical Exam Vitals and nursing note reviewed.  Constitutional:      Appearance: Normal appearance.  HENT:     Head: Atraumatic.  Eyes:     Extraocular Movements: Extraocular movements intact.     Conjunctiva/sclera: Conjunctivae normal.  Cardiovascular:     Rate and Rhythm: Normal rate and regular rhythm.  Pulmonary:     Effort: Pulmonary effort is normal.     Breath sounds: Normal breath sounds.  Abdominal:     General: Bowel sounds are normal. There is no distension.     Palpations: Abdomen is soft.     Tenderness: There is no abdominal tenderness. There is no right CVA tenderness, left CVA tenderness or guarding.  Genitourinary:    Comments: GU exam declined Musculoskeletal:        General: Normal range of motion.     Cervical back: Normal range of motion and neck supple.  Skin:    General: Skin is warm and dry.  Neurological:     General: No focal deficit present.     Mental Status: He is oriented to person, place, and time.  Psychiatric:        Mood and Affect: Mood normal.        Thought Content: Thought content normal.        Judgment: Judgment normal.     UC Treatments / Results  Labs (all labs ordered are listed, but only abnormal results are displayed) Labs Reviewed  POCT URINALYSIS DIP (MANUAL ENTRY) - Abnormal; Notable for the following components:      Result Value   Glucose, UA =250 (*)    All other components within normal limits    EKG   Radiology No results found.  Procedures Procedures (including critical care time)  Medications Ordered in UC Medications - No data to display  Initial Impression / Assessment and Plan / UC Course  I have reviewed the triage vital signs and the nursing notes.  Pertinent labs & imaging results that were available during my care of the patient were reviewed by me and considered in my medical decision making (see  chart for details).     UA without evidence of urinary tract infection,  vitals and exam benign and reassuring.  Suspect prostatitis.  We will treat with Keflex as this has helped him in the past, discussed close follow-up with primary care or urology for recheck.  Final Clinical Impressions(s) / UC Diagnoses   Final diagnoses:  Dysuria   Discharge Instructions   None    ED Prescriptions     Medication Sig Dispense Auth. Provider   cephALEXin (KEFLEX) 500 MG capsule Take 1 capsule (500 mg total) by mouth 2 (two) times daily. 28 capsule Particia Nearing, [place]      PDMP not reviewed this encounter.   Particia Nearing, [place] 12/26/21 1435

## 2021-12-26 NOTE — ED Triage Notes (Signed)
Patient states that about a week ago he started having symptoms of a UTI  Patient states he has a burning sensation and frequent urination   Patient states he has tried AZO without any relief

## 2022-01-03 DIAGNOSIS — R350 Frequency of micturition: Secondary | ICD-10-CM | POA: Diagnosis not present

## 2022-01-08 ENCOUNTER — Other Ambulatory Visit: Payer: Self-pay

## 2022-01-08 ENCOUNTER — Ambulatory Visit
Admission: EM | Admit: 2022-01-08 | Discharge: 2022-01-08 | Disposition: A | Discharge status: Home / Self Care | Payer: 59

## 2022-01-08 DIAGNOSIS — J3089 Other allergic rhinitis: Secondary | ICD-10-CM

## 2022-01-08 DIAGNOSIS — J4541 Moderate persistent asthma with (acute) exacerbation: Secondary | ICD-10-CM

## 2022-01-08 MED ORDER — FLUTICASONE PROPIONATE 50 MCG/ACT NA SUSP: 1.0000 | Freq: Two times a day (BID) | NASAL | 2 refills | Status: DC

## 2022-01-08 MED ORDER — PROMETHAZINE-DM 6.25-15 MG/5ML PO SYRP: 5.0000 mL | ORAL_SOLUTION | Freq: Four times a day (QID) | ORAL | 0 refills | Status: DC | PRN

## 2022-01-08 MED ORDER — FLUTICASONE-SALMETEROL 250-50 MCG/ACT IN AEPB: 1.0000 | INHALATION_SPRAY | Freq: Two times a day (BID) | RESPIRATORY_TRACT | 2 refills | Status: DC

## 2022-01-08 MED ORDER — CETIRIZINE HCL 10 MG PO TABS: 10.0000 mg | ORAL_TABLET | Freq: Every day | ORAL | 2 refills | Status: DC

## 2022-01-08 MED ORDER — ALBUTEROL SULFATE HFA 108 (90 BASE) MCG/ACT IN AERS: 1.0000 | INHALATION_SPRAY | Freq: Four times a day (QID) | RESPIRATORY_TRACT | 0 refills | Status: DC | PRN

## 2022-01-08 NOTE — ED Triage Notes (Signed)
Patient states he thinks he has asthma/ bronchitis   Patient states that his chest is hurting, mouth and teeth hurting  Patient can not take Prednisone

## 2022-01-09 NOTE — ED Provider Notes (Signed)
[facility]    CSN: [id number] Arrival date & time: 01/08/22  0804      History   Chief Complaint Chief Complaint  Patient presents with   Cough    Runny nose, chest hurts and cough    HPI Jacob Acosta is a 61 y.o. male.   Presenting today with a week or so of progressively worsening postnasal drip, nasal congestion, chest tightness, coughing worse when he goes outside.  He reports a history of seasonal allergies, possibly some asthma with history of frequent bronchitis but not currently taking anything for these.  He denies fever, chills, sore throat, chest pain, abdominal pain, nausea vomiting diarrhea, new sick contacts.  So far trying over-the-counter decongestants with only minimal temporary relief.   Past Medical History:  Diagnosis Date   Anxiety    Diabetes mellitus without complication ([place])    High cholesterol     Patient Active Problem List   Diagnosis Date Noted   Personal history of noncompliance with medical treatment, presenting hazards to health 12/13/2017   Uncontrolled type 2 diabetes mellitus with hyperglycemia ([place]) 08/30/2017   Mixed hyperlipidemia 08/30/2017   Physical exam, annual 01/28/2013   Overweight(278.02) 01/28/2013   History of nephrolithiasis 01/28/2013   Tendonitis of ankle or foot 01/28/2013    Past Surgical History:  Procedure Laterality Date   dental sugery     at age 26   [place] Medications    Prior to Admission medications   Medication Sig Start Date End Date Taking? Authorizing Provider  albuterol (VENTOLIN HFA) 108 (90 Base) MCG/ACT inhaler Inhale 1-2 puffs into the lungs every 6 (six) hours as needed for wheezing or shortness of breath. 01/08/22  Yes Volney American, PA-C  cetirizine (ZYRTEC ALLERGY) 10 MG tablet Take 1 tablet (10 mg total) by mouth daily. 01/08/22  Yes Volney American, PA-C  fluticasone [facility]) 50 MCG/ACT nasal spray Place 1 spray into both nostrils 2  (two) times daily. 01/08/22  Yes Volney American, PA-C  fluticasone-salmeterol (ADVAIR DISKUS) 250-50 MCG/ACT AEPB Inhale 1 puff into the lungs in the morning and at bedtime. Rinse mouth with water after each use 01/08/22  Yes Volney American, PA-C  promethazine-dextromethorphan (PROMETHAZINE-DM) 6.25-15 MG/5ML syrup Take 5 mLs by mouth 4 (four) times daily as needed. 01/08/22  Yes Volney American, PA-C  aspirin 81 MG tablet Take 162 mg by mouth daily.     [provider]  BETA CAROTENE PO Take 1 tablet by mouth daily.    [provider]  cephALEXin (KEFLEX) 500 MG capsule Take 1 capsule (500 mg total) by mouth 2 (two) times daily. 12/26/21   Volney American, PA-C  fluconazole (DIFLUCAN) 150 MG tablet Take by mouth. 01/03/22   [provider]  Insulin Glargine (BASAGLAR KWIKPEN) 100 UNIT/ML Inject 20 Units into the skin at bedtime. 12/19/20   [provider]  KRILL OIL PO Take 1 capsule by mouth daily.    [provider]  Multiple Vitamins-Minerals (CENTRUM SILVER 50+MEN PO) Take 1 tablet by mouth daily.    [provider]  pravastatin (PRAVACHOL) 10 MG tablet Take 10 mg by mouth at bedtime. 11/01/20   [provider]  tamsulosin (FLOMAX) 0.4 MG CAPS capsule Take 1 capsule (0.4 mg total) by mouth daily after supper. 08/13/21   LampteyMyrene Galas, MD  vitamin C (ASCORBIC ACID) 500 MG tablet Take 1,000 mg by mouth daily.  [provider]  metoprolol succinate (TOPROL XL) 25 MG 24 hr tablet Take 1 tablet (25 mg total) by mouth daily. 02/06/19 11/18/19  Croitoru, Mihai, MD    Family History Family History  Problem Relation Age of Onset   Anxiety disorder Mother    Depression Father    Alcohol abuse Father     Social History Social History   Tobacco Use   Smoking status: Former    Types: Cigarettes   Smokeless tobacco: Never   Tobacco comments:    only smoked occasionally  Vaping Use   Vaping Use:  Never used  Substance Use Topics   Alcohol use: No   Drug use: No     Allergies   Ciprofloxacin hcl, Codeine, Metformin and related, Penicillins, and Toujeo max solostar [insulin glargine]   Review of Systems Review of Systems Per HPI  Physical Exam Triage Vital Signs ED Triage Vitals  Enc Vitals Group     BP 01/08/22 0825 (!) 148/89     Pulse Rate 01/08/22 0825 75     Resp 01/08/22 0825 18     Temp 01/08/22 0825 97.8 F (36.6 C)     Temp Source 01/08/22 0825 Oral     SpO2 01/08/22 0825 96 %     Weight --      Height --      Head Circumference --      Peak Flow --      Pain Score 01/08/22 0821 0     Pain Loc --      Pain Edu? --      Excl. in [place]? --    No data found.  Updated Vital Signs BP (!) 148/89 (BP Location: Right Arm)    Pulse 75    Temp 97.8 F (36.6 C) (Oral)    Resp 18    SpO2 96%   Visual Acuity Right Eye Distance:   Left Eye Distance:   Bilateral Distance:    Right Eye Near:   Left Eye Near:    Bilateral Near:     Physical Exam Vitals and nursing note reviewed.  Constitutional:      Appearance: He is well-developed.  HENT:     Head: Atraumatic.     Right Ear: Tympanic membrane and external ear normal.     Left Ear: Tympanic membrane and external ear normal.     Nose: Rhinorrhea present.     Mouth/Throat:     Mouth: Mucous membranes are moist.     Pharynx: Posterior oropharyngeal erythema present. No oropharyngeal exudate.  Eyes:     Conjunctiva/sclera: Conjunctivae normal.     Pupils: Pupils are equal, round, and reactive to light.  Cardiovascular:     Rate and Rhythm: Normal rate and regular rhythm.  Pulmonary:     Effort: Pulmonary effort is normal. No respiratory distress.     Breath sounds: Wheezing present. No rales.     Comments: Mild scattered wheezes bilaterally Musculoskeletal:        General: Normal range of motion.     Cervical back: Normal range of motion and neck supple.  Lymphadenopathy:     Cervical: No cervical  adenopathy.  Skin:    General: Skin is warm and dry.  Neurological:     Mental Status: He is alert and oriented to person, place, and time.  Psychiatric:        Behavior: Behavior normal.   UC Treatments / Results  Labs (all labs ordered are listed, but  only abnormal results are displayed) Labs Reviewed - No data to display  EKG   Radiology No results found.  Procedures Procedures (including critical care time)  Medications Ordered in UC Medications - No data to display  Initial Impression / Assessment and Plan / UC Course  I have reviewed the triage vital signs and the nursing notes.  Pertinent labs & imaging results that were available during my care of the patient were reviewed by me and considered in my medical decision making (see chart for details).     Vitals reassuring, suspect reactive airway exacerbation from uncontrolled seasonal allergies.  He does not tolerate oral prednisone so we will treat with Advair, albuterol as needed, start Zyrtec and Flonase regimen for seasonal allergy control and Phenergan DM for cough.  Close follow-up for worsening or unresolving symptoms.  Final Clinical Impressions(s) / UC Diagnoses   Final diagnoses:  Seasonal allergic rhinitis due to other allergic trigger  Moderate persistent reactive airway disease with acute exacerbation   Discharge Instructions   None    ED Prescriptions     Medication Sig Dispense Auth. Provider   fluticasone-salmeterol (ADVAIR DISKUS) 250-50 MCG/ACT AEPB Inhale 1 puff into the lungs in the morning and at bedtime. Rinse mouth with water after each use 60 each Volney American, PA-C   albuterol (VENTOLIN HFA) 108 (90 Base) MCG/ACT inhaler Inhale 1-2 puffs into the lungs every 6 (six) hours as needed for wheezing or shortness of breath. [place], [place]   promethazine-dextromethorphan (PROMETHAZINE-DM) 6.25-15 MG/5ML syrup Take 5 mLs by mouth 4 (four) times daily as needed. 100  mL Volney American, PA-C   cetirizine (ZYRTEC ALLERGY) 10 MG tablet Take 1 tablet (10 mg total) by mouth daily. 30 tablet Volney American, PA-C   fluticasone [facility]) 50 MCG/ACT nasal spray Place 1 spray into both nostrils 2 (two) times daily. 16 g Volney American, [place]      PDMP not reviewed this encounter.   Merrie Roof [place], [place] 01/09/22 [phone]

## 2022-01-16 ENCOUNTER — Emergency Department (HOSPITAL_COMMUNITY): Payer: 59

## 2022-01-16 ENCOUNTER — Inpatient Hospital Stay (HOSPITAL_COMMUNITY)
Admission: EM | Admit: 2022-01-16 | Discharge: 2022-01-23 | DRG: 234 | Disposition: A | Discharge status: Home / Self Care | Payer: 59 | Attending: Thoracic Surgery (Cardiothoracic Vascular Surgery) | Admitting: Thoracic Surgery (Cardiothoracic Vascular Surgery)

## 2022-01-16 ENCOUNTER — Other Ambulatory Visit: Payer: Self-pay

## 2022-01-16 ENCOUNTER — Encounter (HOSPITAL_COMMUNITY): Payer: Self-pay | Admitting: *Deleted

## 2022-01-16 DIAGNOSIS — Z881 Allergy status to other antibiotic agents status: Secondary | ICD-10-CM | POA: Diagnosis not present

## 2022-01-16 DIAGNOSIS — E785 Hyperlipidemia, unspecified: Secondary | ICD-10-CM | POA: Diagnosis not present

## 2022-01-16 DIAGNOSIS — I249 Acute ischemic heart disease, unspecified: Secondary | ICD-10-CM | POA: Diagnosis not present

## 2022-01-16 DIAGNOSIS — Z87891 Personal history of nicotine dependence: Secondary | ICD-10-CM | POA: Diagnosis not present

## 2022-01-16 DIAGNOSIS — I214 Non-ST elevation (NSTEMI) myocardial infarction: Principal | ICD-10-CM | POA: Diagnosis present

## 2022-01-16 DIAGNOSIS — E1159 Type 2 diabetes mellitus with other circulatory complications: Secondary | ICD-10-CM | POA: Diagnosis not present

## 2022-01-16 DIAGNOSIS — Z818 Family history of other mental and behavioral disorders: Secondary | ICD-10-CM | POA: Diagnosis not present

## 2022-01-16 DIAGNOSIS — E7800 Pure hypercholesterolemia, unspecified: Secondary | ICD-10-CM | POA: Diagnosis not present

## 2022-01-16 DIAGNOSIS — E669 Obesity, unspecified: Secondary | ICD-10-CM | POA: Diagnosis not present

## 2022-01-16 DIAGNOSIS — E8770 Fluid overload, unspecified: Secondary | ICD-10-CM | POA: Diagnosis not present

## 2022-01-16 DIAGNOSIS — Z79899 Other long term (current) drug therapy: Secondary | ICD-10-CM

## 2022-01-16 DIAGNOSIS — E1169 Type 2 diabetes mellitus with other specified complication: Secondary | ICD-10-CM | POA: Diagnosis not present

## 2022-01-16 DIAGNOSIS — Z880 Allergy status to penicillin: Secondary | ICD-10-CM | POA: Diagnosis not present

## 2022-01-16 DIAGNOSIS — I2510 Atherosclerotic heart disease of native coronary artery without angina pectoris: Secondary | ICD-10-CM | POA: Diagnosis present

## 2022-01-16 DIAGNOSIS — I10 Essential (primary) hypertension: Secondary | ICD-10-CM | POA: Diagnosis present

## 2022-01-16 DIAGNOSIS — Z20822 Contact with and (suspected) exposure to covid-19: Secondary | ICD-10-CM | POA: Diagnosis present

## 2022-01-16 DIAGNOSIS — Z7982 Long term (current) use of aspirin: Secondary | ICD-10-CM | POA: Diagnosis not present

## 2022-01-16 DIAGNOSIS — I25110 Atherosclerotic heart disease of native coronary artery with unstable angina pectoris: Secondary | ICD-10-CM | POA: Diagnosis present

## 2022-01-16 DIAGNOSIS — Z888 Allergy status to other drugs, medicaments and biological substances status: Secondary | ICD-10-CM | POA: Diagnosis not present

## 2022-01-16 DIAGNOSIS — Z7951 Long term (current) use of inhaled steroids: Secondary | ICD-10-CM | POA: Diagnosis not present

## 2022-01-16 DIAGNOSIS — E1165 Type 2 diabetes mellitus with hyperglycemia: Secondary | ICD-10-CM | POA: Diagnosis present

## 2022-01-16 DIAGNOSIS — D62 Acute posthemorrhagic anemia: Secondary | ICD-10-CM | POA: Diagnosis not present

## 2022-01-16 DIAGNOSIS — J939 Pneumothorax, unspecified: Secondary | ICD-10-CM | POA: Diagnosis not present

## 2022-01-16 DIAGNOSIS — Z6836 Body mass index (BMI) 36.0-36.9, adult: Secondary | ICD-10-CM | POA: Diagnosis not present

## 2022-01-16 DIAGNOSIS — R079 Chest pain, unspecified: Secondary | ICD-10-CM | POA: Diagnosis not present

## 2022-01-16 DIAGNOSIS — Z8249 Family history of ischemic heart disease and other diseases of the circulatory system: Secondary | ICD-10-CM

## 2022-01-16 DIAGNOSIS — E119 Type 2 diabetes mellitus without complications: Secondary | ICD-10-CM | POA: Diagnosis not present

## 2022-01-16 DIAGNOSIS — Z951 Presence of aortocoronary bypass graft: Secondary | ICD-10-CM

## 2022-01-16 DIAGNOSIS — J9811 Atelectasis: Secondary | ICD-10-CM | POA: Diagnosis not present

## 2022-01-16 DIAGNOSIS — Z794 Long term (current) use of insulin: Secondary | ICD-10-CM | POA: Diagnosis not present

## 2022-01-16 DIAGNOSIS — Z01810 Encounter for preprocedural cardiovascular examination: Secondary | ICD-10-CM | POA: Diagnosis not present

## 2022-01-16 LAB — RESP PANEL BY RT-PCR (FLU A&B, COVID) ARPGX2
Influenza A by PCR: NEGATIVE
Influenza B by PCR: NEGATIVE
SARS Coronavirus 2 by RT PCR: NEGATIVE

## 2022-01-16 LAB — CBC
HCT: 45 % (ref 39.0–52.0)
Hemoglobin: 14.5 g/dL (ref 13.0–17.0)
MCH: 28.9 pg (ref 26.0–34.0)
MCHC: 32.2 g/dL (ref 30.0–36.0)
MCV: 89.6 fL (ref 80.0–100.0)
Platelets: 262 10*3/uL (ref 150–400)
RBC: 5.02 MIL/uL (ref 4.22–5.81)
RDW: 13 % (ref 11.5–15.5)
WBC: 7.3 10*3/uL (ref 4.0–10.5)
nRBC: 0 % (ref 0.0–0.2)

## 2022-01-16 LAB — COMPREHENSIVE METABOLIC PANEL
ALT: 28 U/L (ref 0–44)
AST: 20 U/L (ref 15–41)
Albumin: 4 g/dL (ref 3.5–5.0)
Alkaline Phosphatase: 83 U/L (ref 38–126)
Anion gap: 7 (ref 5–15)
BUN: 21 mg/dL (ref 8–23)
CO2: 29 mmol/L (ref 22–32)
Calcium: 9.1 mg/dL (ref 8.9–10.3)
Chloride: 101 mmol/L (ref 98–111)
Creatinine, Ser: 1.03 mg/dL (ref 0.61–1.24)
GFR, Estimated: >60 mL/min (ref >60)
Glucose, Bld: 461 mg/dL — ABNORMAL HIGH (ref 70–99)
Potassium: 4.4 mmol/L (ref 3.5–5.1)
Sodium: 137 mmol/L (ref 135–145)
Total Bilirubin: 0.3 mg/dL (ref 0.3–1.2)
Total Protein: 7.2 g/dL (ref 6.5–8.1)

## 2022-01-16 LAB — TROPONIN I (HIGH SENSITIVITY)
Troponin I (High Sensitivity): 29 ng/L — ABNORMAL HIGH (ref <18)
Troponin I (High Sensitivity): 77 ng/L — ABNORMAL HIGH (ref <18)

## 2022-01-16 LAB — LIPASE, BLOOD: Lipase: 32 U/L (ref 11–51)

## 2022-01-16 MED ORDER — SODIUM CHLORIDE 0.9 % IV BOLUS
1000.0000 mL | Freq: Once | INTRAVENOUS | Status: AC
  Administered 2022-01-16: 1000 mL via INTRAVENOUS

## 2022-01-16 MED ORDER — HEPARIN (PORCINE) 25000 UT/250ML-% IV SOLN
1400.0000 [IU]/h | INTRAVENOUS | Status: DC
Start: 2022-01-16 — End: 2022-01-17
  Administered 2022-01-16: 1100 [IU]/h via INTRAVENOUS
  Filled 2022-01-16: qty 250

## 2022-01-16 MED ORDER — ASPIRIN 81 MG PO CHEW
324.0000 mg | CHEWABLE_TABLET | Freq: Once | ORAL | Status: AC
  Administered 2022-01-16: 324 mg via ORAL
  Filled 2022-01-16: qty 4

## 2022-01-16 MED ORDER — HEPARIN BOLUS VIA INFUSION
4000.0000 [IU] | Freq: Once | INTRAVENOUS | Status: AC
  Administered 2022-01-16: 4000 [IU] via INTRAVENOUS

## 2022-01-16 MED ORDER — ASPIRIN 325 MG PO TABS: 325.0000 mg | ORAL_TABLET | Freq: Once | ORAL | Status: DC

## 2022-01-16 NOTE — Progress Notes (Signed)
ANTICOAGULATION CONSULT NOTE - Initial Consult ? ?Pharmacy Consult for Heparin ?Indication: chest pain/ACS ? ?Allergies  ?Allergen Reactions  ? Ciprofloxacin Hcl Other (See Comments)  ?  hallucination  ? Codeine Other (See Comments)  ?  Causes him to feel very anxious  ? Metformin And Related Nausea Only and Palpitations  ? Penicillins Rash  ?  Has patient had a PCN reaction causing immediate rash, facial/tongue/throat swelling, SOB or lightheadedness with hypotension: Yes ?Has patient had a PCN reaction causing severe rash involving mucus membranes or skin necrosis: No ?Has patient had a PCN reaction that required hospitalization: No ?Has patient had a PCN reaction occurring within the last 10 years: Yes ?If all of the above answers are "NO", then may proceed with Cephalosporin use. ?  ? Toujeo Max [employer] [Insulin Glargine] Itching, Nausea Only and Palpitations  ?  Itching throat  ? ? ?Patient Measurements: ?Height: 5\' 6"  (167.6 cm) ?Weight: 103 kg (227 lb) ?IBW/kg (Calculated) : 63.8 ?HEPARIN DW (KG): 86.7  ? ?Vital Signs: ?Temp: 97.7 ?F (36.5 ?C) (03/05 1710) ?Temp Source: Oral (03/05 1710) ?BP: 150/87 (03/05 2100) ?Pulse Rate: 71 (03/05 2100) ? ?Labs: ?Recent Labs  ?  01/16/22 ?1818 01/16/22 ?2035  ?HGB 14.5  --   ?HCT 45.0  --   ?PLT 262  --   ?CREATININE 1.03  --   ?TROPONINIHS 29* 77*  ? ? ?Estimated Creatinine Clearance: 83.6 mL/min (by C-G formula based on SCr of 1.03 mg/dL). ? ? ?Medical History: ?Past Medical History:  ?Diagnosis Date  ? Anxiety   ? Diabetes mellitus without complication (HCC)   ? High cholesterol   ? ? ?Medications:  ?See med rec ? ?Assessment: ?Patient presented with chest pain. Not on any oral anticoagulants. Pharmacy asked to start heparin ? ?Goal of Therapy:  ?Heparin level 0.3-0.7 units/ml ?Monitor platelets by anticoagulation protocol: Yes ?  ?Plan:  ?Give 4000 units bolus x 1 ?Start heparin infusion at 1100 units/hr ?Check anti-Xa level in ~6 hours and daily while on  heparin ?Continue to monitor H&H and platelets ? ?2036, BS Pharm D, BCPS ?Clinical Pharmacist ?Pager [phone]  ?01/16/2022,9:54 PM ? ? ?

## 2022-01-16 NOTE — ED Provider Notes (Signed)
[facility] EMERGENCY DEPARTMENT Provider Note   CSN: [id number] Arrival date & time: 01/16/22  1705     History  Chief Complaint  Patient presents with   Chest Pain    Jacob Acosta is a 61 y.o. male.   Chest Pain Associated symptoms: abdominal pain and cough   Associated symptoms: no dizziness, no dysphagia, no fever, no nausea, no numbness, no shortness of breath, no vomiting and no weakness        Jacob Acosta is a 61 y.o. male past medical history significant for type 2 diabetes, hyperlipidemia and anxiety who presents to the Emergency Department complaining of intermittent episodes of substernal chest pain.  Symptoms have been present for 2 weeks.  He states that episodes are reproduced with going outside.  He attributes his symptoms to allergies or pollen and he has been using an albuterol inhaler.  He describes having a pulling or tearing sensation of his mid chest that is associated with left jaw pain.  Episodes typically last several minutes and spontaneously resolve.  He also has similar symptoms with exercise.  Earlier today, approximately 2 hours ago he had a similar episode while inside.  Patient significant other states that he appeared pale and clammy and was complaining of upper abdominal pain along with the chest pain.  Symptoms lasted 10 to 20 minutes and again spontaneously resolved on the way to the emergency department.  He denies having any chest or jaw pain currently.  He does report some tenderness of his upper abdomen.  No nausea, vomiting, back pain or history of gallbladder disease.  Patient states he had a treadmill stress test in 2018 that was positive for ischemic changes.   Home Medications Prior to Admission medications   Medication Sig Start Date End Date Taking? Authorizing Provider  albuterol (VENTOLIN HFA) 108 (90 Base) MCG/ACT inhaler Inhale 1-2 puffs into the lungs every 6 (six) hours as needed for wheezing or shortness of breath. 01/08/22    Particia Nearing, PA-C  aspirin 81 MG tablet Take 162 mg by mouth daily.     [provider]  BETA CAROTENE PO Take 1 tablet by mouth daily.    [provider]  cephALEXin (KEFLEX) 500 MG capsule Take 1 capsule (500 mg total) by mouth 2 (two) times daily. 12/26/21   Particia Nearing, PA-C  cetirizine (ZYRTEC ALLERGY) 10 MG tablet Take 1 tablet (10 mg total) by mouth daily. 01/08/22   Particia Nearing, PA-C  fluconazole (DIFLUCAN) 150 MG tablet Take by mouth. 01/03/22   [provider]  fluticasone (FLONASE) 50 MCG/ACT nasal spray Place 1 spray into both nostrils 2 (two) times daily. 01/08/22   Particia Nearing, PA-C  fluticasone-salmeterol (ADVAIR DISKUS) 250-50 MCG/ACT AEPB Inhale 1 puff into the lungs in the morning and at bedtime. Rinse mouth with water after each use 01/08/22   Particia Nearing, PA-C  Insulin Glargine [facility]) 100 UNIT/ML Inject 20 Units into the skin at bedtime. 12/19/20   [provider]  KRILL OIL PO Take 1 capsule by mouth daily.    [provider]  Multiple Vitamins-Minerals (CENTRUM SILVER 50+MEN PO) Take 1 tablet by mouth daily.    [provider]  pravastatin (PRAVACHOL) 10 MG tablet Take 10 mg by mouth at bedtime. 11/01/20   [provider]  promethazine-dextromethorphan (PROMETHAZINE-DM) 6.25-15 MG/5ML syrup Take 5 mLs by mouth 4 (four) times daily as needed. 01/08/22   Particia Nearing, PA-C  tamsulosin (FLOMAX) 0.4 MG CAPS capsule Take 1 capsule (0.4 mg total) by mouth daily after supper. 08/13/21   LampteyBritta Mccreedy, MD  vitamin C (ASCORBIC ACID) 500 MG tablet Take 1,000 mg by mouth daily.    [provider]  metoprolol succinate (TOPROL XL) 25 MG 24 hr tablet Take 1 tablet (25 mg total) by mouth daily. 02/06/19 11/18/19  Croitoru, Mihai, MD      Allergies    Ciprofloxacin hcl, Codeine, Metformin and related, Penicillins, and Toujeo max solostar [insulin  glargine]    Review of Systems   Review of Systems  Constitutional:  Negative for appetite change, chills and fever.  HENT:  Negative for sore throat and trouble swallowing.   Respiratory:  Positive for cough. Negative for shortness of breath.   Cardiovascular:  Positive for chest pain.  Gastrointestinal:  Positive for abdominal pain. Negative for diarrhea, nausea and vomiting.  Genitourinary:  Negative for dysuria.  Skin:  Negative for rash.  Neurological:  Negative for dizziness, weakness and numbness.  All other systems reviewed and are negative.  Physical Exam Updated Vital Signs BP (!) 143/97 (BP Location: Right Arm)    Pulse 92    Temp 97.7 F (36.5 C) (Oral)    Resp 16    Ht 5\' 6"  (1.676 m)    Wt 103 kg    SpO2 96%    BMI 36.64 kg/m  Physical Exam Vitals and nursing note reviewed.  Constitutional:      General: He is not in acute distress.    Appearance: Normal appearance. He is not ill-appearing.  Cardiovascular:     Rate and Rhythm: Normal rate and regular rhythm.     Pulses: Normal pulses.  Pulmonary:     Effort: Pulmonary effort is normal.     Breath sounds: Normal breath sounds.  Abdominal:     General: There is no distension.     Palpations: Abdomen is soft.     Tenderness: There is abdominal tenderness (Mild epigastric tenderness.  No guarding or rebound.).  Musculoskeletal:        General: Normal range of motion.     Right lower leg: No edema.     Left lower leg: No edema.  Skin:    General: Skin is warm.     Capillary Refill: Capillary refill takes less than 2 seconds.     Findings: No rash.  Neurological:     General: No focal deficit present.     Mental Status: He is alert.     Sensory: No sensory deficit.     Motor: No weakness.    ED Results / Procedures / Treatments   Labs (all labs ordered are listed, but only abnormal results are displayed) Labs Reviewed  COMPREHENSIVE METABOLIC PANEL - Abnormal; Notable for the following components:       Result Value   Glucose, Bld 461 (*)    All other components within normal limits  TROPONIN I (HIGH SENSITIVITY) - Abnormal; Notable for the following components:   Troponin I (High Sensitivity) 29 (*)    All other components within normal limits  TROPONIN I (HIGH SENSITIVITY) - Abnormal; Notable for the following components:   Troponin I (High Sensitivity) 77 (*)    All other components within normal limits  RESP PANEL BY RT-PCR (FLU A&B, COVID) ARPGX2  CBC  LIPASE, BLOOD  CBC  HEPARIN LEVEL (UNFRACTIONATED)    EKG EKG Interpretation  Date/Time:  Sunday January 16 2022 17:14:20 EST Ventricular  Rate:  87 PR Interval:  152 QRS Duration: 82 QT Interval:  346 QTC Calculation: 416 R Axis:   47 Text Interpretation: Normal sinus rhythm Abnormal ECG Confirmed by Gloris Manchester [phone]) on 01/16/2022 6:11:58 PM  Radiology DG Chest 2 View  Result Date: 01/16/2022 CLINICAL DATA:  Chest pain EXAM: CHEST - 2 VIEW COMPARISON:  12/10/2017 FINDINGS: The heart size and mediastinal contours are within normal limits. Both lungs are clear. The visualized skeletal structures are unremarkable. IMPRESSION: No active cardiopulmonary disease. Electronically Signed   By: Elige Ko M.D.   On: 01/16/2022 17:45    Procedures Procedures    Medications Ordered in ED Medications  heparin bolus via infusion 4,000 Units (has no administration in time range)    Followed by  heparin ADULT infusion 100 units/mL (25000 units/237mL) (has no administration in time range)  sodium chloride 0.9 % bolus 1,000 mL (0 mLs Intravenous Stopped 01/16/22 2155)  aspirin chewable tablet 324 mg (324 mg Oral Given 01/16/22 2053)    ED Course/ Medical Decision Making/ A&P                           Medical Decision Making Patient here with concerning history of waxing and waning chest pain x2 weeks.  Describes tearing a pulling sensation of his substernal chest that patient states is reproduced when he goes outside or exercises.  Pain  will typically last for several minutes then resolved.  Today, he had a 15-minute episode of substernal chest pain with jaw pain that spontaneously resolved prior to ER arrival.  On my exam, patient is well-appearing no active chest pain dyspnea or diaphoresis.  Vital signs he is mildly hypertensive but otherwise vital signs are reassuring.  He does have concerning history for ACS, will obtain labs, EKG, chest x-ray and administer aspirin.  He will likely require hospitalization.  Amount and/or Complexity of Data Reviewed External Data Reviewed: notes.    Details: Prior medical records reviewed by me, patient had treadmill stress test in 2018 that was positive for ischemic change.  He has seen Dr. Royann Shivers in the past but has been several years ago. Labs: ordered.    Details: Labs interpreted by me, no evidence of leukocytosis.  Hemoglobin unremarkable.  Chemistries show blood sugar of 461.  Patient given IV fluids.  His anion gap is unremarkable initial troponin 26, delta troponin increased to 77.  COVID test pending Radiology: ordered.    Details: Chest x-ray shows no active cardiopulmonary disease.  I agree with radiology interpretation. ECG/medicine tests: ordered.    Details: EKG shows normal sinus rhythm Discussion of management or test interpretation with external provider(s): Patient here with substernal chest pain waxing and waning x2 weeks reproduced with exertion.  He has a heart score of 4.  He is concerning for ACS.  He has been given aspirin and started on heparin.  I have consulted cardiology and spoken with Dr. Lendell Caprice who recommends patient be sent to [facility] for likely catheterization tomorrow   Risk OTC drugs. Prescription drug management. Decision regarding hospitalization.           Final Clinical Impression(s) / ED Diagnoses Final diagnoses:  ACS (acute coronary syndrome) [facility])    Rx / DC Orders ED Discharge Orders     None         Rosey Bath 01/16/22 2219    Gloris Manchester, MD 01/21/22 [phone]

## 2022-01-16 NOTE — ED Triage Notes (Signed)
Pt with CP for couple weeks, mid.  Pt thought it was allergies but states pain is not usual for him.  Pt not able to be outside due to allergies. Denies N/V, +SOB at times.  ?

## 2022-01-17 ENCOUNTER — Other Ambulatory Visit (HOSPITAL_COMMUNITY): Payer: PRIVATE HEALTH INSURANCE

## 2022-01-17 ENCOUNTER — Inpatient Hospital Stay (HOSPITAL_COMMUNITY): Payer: 59

## 2022-01-17 ENCOUNTER — Encounter (HOSPITAL_COMMUNITY)
Admission: EM | Disposition: A | Discharge status: Home / Self Care | Payer: Self-pay | Source: Home / Self Care | Attending: Thoracic Surgery (Cardiothoracic Vascular Surgery)

## 2022-01-17 DIAGNOSIS — E785 Hyperlipidemia, unspecified: Secondary | ICD-10-CM

## 2022-01-17 DIAGNOSIS — Z01810 Encounter for preprocedural cardiovascular examination: Secondary | ICD-10-CM | POA: Diagnosis not present

## 2022-01-17 DIAGNOSIS — I10 Essential (primary) hypertension: Secondary | ICD-10-CM

## 2022-01-17 DIAGNOSIS — I214 Non-ST elevation (NSTEMI) myocardial infarction: Secondary | ICD-10-CM

## 2022-01-17 DIAGNOSIS — I25110 Atherosclerotic heart disease of native coronary artery with unstable angina pectoris: Secondary | ICD-10-CM

## 2022-01-17 DIAGNOSIS — E1169 Type 2 diabetes mellitus with other specified complication: Secondary | ICD-10-CM

## 2022-01-17 DIAGNOSIS — E119 Type 2 diabetes mellitus without complications: Secondary | ICD-10-CM

## 2022-01-17 DIAGNOSIS — I249 Acute ischemic heart disease, unspecified: Secondary | ICD-10-CM

## 2022-01-17 HISTORY — PX: LEFT HEART CATH AND CORONARY ANGIOGRAPHY: CATH118249

## 2022-01-17 LAB — CBC
HCT: 40.8 % (ref 39.0–52.0)
Hemoglobin: 13.4 g/dL (ref 13.0–17.0)
MCH: 28.5 pg (ref 26.0–34.0)
MCHC: 32.8 g/dL (ref 30.0–36.0)
MCV: 86.8 fL (ref 80.0–100.0)
Platelets: 253 10*3/uL (ref 150–400)
RBC: 4.7 MIL/uL (ref 4.22–5.81)
RDW: 13 % (ref 11.5–15.5)
WBC: 8.9 10*3/uL (ref 4.0–10.5)
nRBC: 0 % (ref 0.0–0.2)

## 2022-01-17 LAB — GLUCOSE, CAPILLARY
Glucose-Capillary: 134 mg/dL — ABNORMAL HIGH (ref 70–99)
Glucose-Capillary: 147 mg/dL — ABNORMAL HIGH (ref 70–99)
Glucose-Capillary: 147 mg/dL — ABNORMAL HIGH (ref 70–99)
Glucose-Capillary: 190 mg/dL — ABNORMAL HIGH (ref 70–99)
Glucose-Capillary: 194 mg/dL — ABNORMAL HIGH (ref 70–99)
Glucose-Capillary: 273 mg/dL — ABNORMAL HIGH (ref 70–99)

## 2022-01-17 LAB — SURGICAL PCR SCREEN
MRSA, PCR: NEGATIVE
Staphylococcus aureus: NEGATIVE

## 2022-01-17 LAB — BASIC METABOLIC PANEL
Anion gap: 9 (ref 5–15)
BUN: 15 mg/dL (ref 8–23)
CO2: 24 mmol/L (ref 22–32)
Calcium: 8.9 mg/dL (ref 8.9–10.3)
Chloride: 106 mmol/L (ref 98–111)
Creatinine, Ser: 0.77 mg/dL (ref 0.61–1.24)
GFR, Estimated: >60 mL/min (ref >60)
Glucose, Bld: 209 mg/dL — ABNORMAL HIGH (ref 70–99)
Potassium: 3.8 mmol/L (ref 3.5–5.1)
Sodium: 139 mmol/L (ref 135–145)

## 2022-01-17 LAB — LIPID PANEL
Cholesterol: 199 mg/dL (ref 0–200)
HDL: 47 mg/dL (ref >40)
LDL Cholesterol: 139 mg/dL — ABNORMAL HIGH (ref 0–99)
Total CHOL/HDL Ratio: 4.2 RATIO
Triglycerides: 63 mg/dL (ref <150)
VLDL: 13 mg/dL (ref 0–40)

## 2022-01-17 LAB — HEMOGLOBIN A1C
Hgb A1c MFr Bld: 9.1 % — ABNORMAL HIGH (ref 4.8–5.6)
Mean Plasma Glucose: 214.47 mg/dL

## 2022-01-17 LAB — ABO/RH: ABO/RH(D): A POS

## 2022-01-17 LAB — HEPARIN LEVEL (UNFRACTIONATED)
Heparin Unfractionated: 0.1 IU/mL — ABNORMAL LOW (ref 0.30–0.70)
Heparin Unfractionated: 0.35 IU/mL (ref 0.30–0.70)

## 2022-01-17 LAB — HIV ANTIBODY (ROUTINE TESTING W REFLEX): HIV Screen 4th Generation wRfx: NONREACTIVE

## 2022-01-17 LAB — TSH: TSH: 1.169 u[IU]/mL (ref 0.350–4.500)

## 2022-01-17 SURGERY — LEFT HEART CATH AND CORONARY ANGIOGRAPHY
Anesthesia: LOCAL

## 2022-01-17 MED ORDER — VERAPAMIL HCL 2.5 MG/ML IV SOLN
INTRAVENOUS | Status: DC | PRN
  Administered 2022-01-17: 10 mL via INTRA_ARTERIAL

## 2022-01-17 MED ORDER — HEPARIN (PORCINE) IN NACL 1000-0.9 UT/500ML-% IV SOLN
INTRAVENOUS | Status: DC | PRN
  Administered 2022-01-17 (×2): 500 mL

## 2022-01-17 MED ORDER — SODIUM CHLORIDE 0.9 % WEIGHT BASED INFUSION
3.0000 mL/kg/h | INTRAVENOUS | Status: DC
  Administered 2022-01-17: 3 mL/kg/h via INTRAVENOUS

## 2022-01-17 MED ORDER — FLUTICASONE PROPIONATE 50 MCG/ACT NA SUSP
1.0000 | Freq: Two times a day (BID) | NASAL | Status: DC
  Administered 2022-01-17 – 2022-01-22 (×7): 1 via NASAL
  Filled 2022-01-17: qty 16

## 2022-01-17 MED ORDER — SODIUM CHLORIDE 0.9% FLUSH: 3.0000 mL | INTRAVENOUS | Status: DC | PRN

## 2022-01-17 MED ORDER — HEPARIN SODIUM (PORCINE) 1000 UNIT/ML IJ SOLN
INTRAMUSCULAR | Status: AC
  Filled 2022-01-17: qty 10

## 2022-01-17 MED ORDER — ASCORBIC ACID 500 MG PO TABS
1000.0000 mg | ORAL_TABLET | Freq: Every day | ORAL | Status: DC
Start: 2022-01-17 — End: 2022-01-23
  Administered 2022-01-17 – 2022-01-23 (×6): 1000 mg via ORAL
  Filled 2022-01-17 (×6): qty 2

## 2022-01-17 MED ORDER — IOHEXOL 350 MG/ML SOLN
INTRAVENOUS | Status: DC | PRN
  Administered 2022-01-17: 45 mL

## 2022-01-17 MED ORDER — LABETALOL HCL 5 MG/ML IV SOLN: 10.0000 mg | INTRAVENOUS | Status: AC | PRN

## 2022-01-17 MED ORDER — ADULT MULTIVITAMIN W/MINERALS CH
1.0000 | ORAL_TABLET | Freq: Every day | ORAL | Status: DC
  Administered 2022-01-17 – 2022-01-23 (×6): 1 via ORAL
  Filled 2022-01-17 (×6): qty 1

## 2022-01-17 MED ORDER — INSULIN ASPART 100 UNIT/ML IJ SOLN
0.0000 [IU] | Freq: Three times a day (TID) | INTRAMUSCULAR | Status: DC
  Administered 2022-01-17: 3 [IU] via SUBCUTANEOUS
  Administered 2022-01-18: 2 [IU] via SUBCUTANEOUS
  Administered 2022-01-18 (×2): 5 [IU] via SUBCUTANEOUS
  Administered 2022-01-19: 8 [IU] via SUBCUTANEOUS

## 2022-01-17 MED ORDER — FENTANYL CITRATE (PF) 100 MCG/2ML IJ SOLN
INTRAMUSCULAR | Status: DC | PRN
Start: 2022-01-17 — End: 2022-01-17
  Administered 2022-01-17: 50 ug via INTRAVENOUS

## 2022-01-17 MED ORDER — SODIUM CHLORIDE 0.9 % IV SOLN: 250.0000 mL | INTRAVENOUS | Status: DC | PRN

## 2022-01-17 MED ORDER — HEPARIN (PORCINE) 25000 UT/250ML-% IV SOLN
1700.0000 [IU]/h | INTRAVENOUS | Status: DC
  Administered 2022-01-17: 1400 [IU]/h via INTRAVENOUS
  Administered 2022-01-18 – 2022-01-19 (×2): 1700 [IU]/h via INTRAVENOUS
  Filled 2022-01-17 (×3): qty 250

## 2022-01-17 MED ORDER — HEPARIN (PORCINE) IN NACL 1000-0.9 UT/500ML-% IV SOLN
INTRAVENOUS | Status: AC
  Filled 2022-01-17: qty 1000

## 2022-01-17 MED ORDER — VERAPAMIL HCL 2.5 MG/ML IV SOLN
INTRAVENOUS | Status: AC
  Filled 2022-01-17: qty 2

## 2022-01-17 MED ORDER — ALBUTEROL SULFATE (2.5 MG/3ML) 0.083% IN NEBU: 2.5000 mg | INHALATION_SOLUTION | Freq: Four times a day (QID) | RESPIRATORY_TRACT | Status: DC | PRN

## 2022-01-17 MED ORDER — MUPIROCIN 2 % EX OINT
1.0000 "application " | TOPICAL_OINTMENT | Freq: Two times a day (BID) | CUTANEOUS | Status: AC
  Administered 2022-01-17 – 2022-01-22 (×9): 1 via NASAL
  Filled 2022-01-17 (×3): qty 22

## 2022-01-17 MED ORDER — FLUTICASONE FUROATE-VILANTEROL 200-25 MCG/ACT IN AEPB
1.0000 | INHALATION_SPRAY | Freq: Every day | RESPIRATORY_TRACT | Status: DC
  Filled 2022-01-17 (×2): qty 28

## 2022-01-17 MED ORDER — SODIUM CHLORIDE 0.9 % WEIGHT BASED INFUSION
1.0000 mL/kg/h | INTRAVENOUS | Status: DC
  Administered 2022-01-17: 1 mL/kg/h via INTRAVENOUS

## 2022-01-17 MED ORDER — MIDAZOLAM HCL 2 MG/2ML IJ SOLN
INTRAMUSCULAR | Status: DC | PRN
  Administered 2022-01-17: 1 mg via INTRAVENOUS

## 2022-01-17 MED ORDER — CHLORHEXIDINE GLUCONATE CLOTH 2 % EX PADS
6.0000 | MEDICATED_PAD | Freq: Every day | CUTANEOUS | Status: DC
  Administered 2022-01-17 – 2022-01-18 (×2): 6 via TOPICAL

## 2022-01-17 MED ORDER — FENTANYL CITRATE (PF) 100 MCG/2ML IJ SOLN
INTRAMUSCULAR | Status: AC
  Filled 2022-01-17: qty 2

## 2022-01-17 MED ORDER — SODIUM CHLORIDE 0.9 % IV SOLN: INTRAVENOUS | Status: AC

## 2022-01-17 MED ORDER — HYDRALAZINE HCL 20 MG/ML IJ SOLN: 10.0000 mg | INTRAMUSCULAR | Status: AC | PRN

## 2022-01-17 MED ORDER — NITROGLYCERIN 0.4 MG SL SUBL: 0.4000 mg | SUBLINGUAL_TABLET | SUBLINGUAL | Status: DC | PRN

## 2022-01-17 MED ORDER — LIDOCAINE HCL (PF) 1 % IJ SOLN
INTRAMUSCULAR | Status: DC | PRN
  Administered 2022-01-17: 2 mL via INTRADERMAL

## 2022-01-17 MED ORDER — SODIUM CHLORIDE 0.9% FLUSH: 3.0000 mL | Freq: Two times a day (BID) | INTRAVENOUS | Status: DC

## 2022-01-17 MED ORDER — ONDANSETRON HCL 4 MG/2ML IJ SOLN: 4.0000 mg | Freq: Four times a day (QID) | INTRAMUSCULAR | Status: DC | PRN

## 2022-01-17 MED ORDER — ACETAMINOPHEN 325 MG PO TABS: 650.0000 mg | ORAL_TABLET | ORAL | Status: DC | PRN

## 2022-01-17 MED ORDER — ASPIRIN EC 81 MG PO TBEC
81.0000 mg | DELAYED_RELEASE_TABLET | Freq: Every day | ORAL | Status: DC
  Administered 2022-01-18: 81 mg via ORAL
  Filled 2022-01-17: qty 1

## 2022-01-17 MED ORDER — MIDAZOLAM HCL 2 MG/2ML IJ SOLN
INTRAMUSCULAR | Status: AC
  Filled 2022-01-17: qty 2

## 2022-01-17 MED ORDER — ROSUVASTATIN CALCIUM 20 MG PO TABS
40.0000 mg | ORAL_TABLET | Freq: Every day | ORAL | Status: DC
Start: 2022-01-17 — End: 2022-01-23
  Administered 2022-01-17 – 2022-01-23 (×6): 40 mg via ORAL
  Filled 2022-01-17 (×6): qty 2

## 2022-01-17 MED ORDER — LORATADINE 10 MG PO TABS
10.0000 mg | ORAL_TABLET | Freq: Every day | ORAL | Status: DC
  Administered 2022-01-18: 09:00:00 10 mg via ORAL
  Filled 2022-01-17 (×6): qty 1

## 2022-01-17 MED ORDER — METOPROLOL TARTRATE 12.5 MG HALF TABLET
12.5000 mg | ORAL_TABLET | Freq: Two times a day (BID) | ORAL | Status: DC
  Administered 2022-01-17 – 2022-01-18 (×5): 12.5 mg via ORAL
  Filled 2022-01-17 (×5): qty 1

## 2022-01-17 MED ORDER — LIDOCAINE HCL (PF) 1 % IJ SOLN
INTRAMUSCULAR | Status: AC
  Filled 2022-01-17: qty 30

## 2022-01-17 MED ORDER — LISINOPRIL 5 MG PO TABS
5.0000 mg | ORAL_TABLET | Freq: Every day | ORAL | Status: DC
  Administered 2022-01-17 – 2022-01-18 (×2): 5 mg via ORAL
  Filled 2022-01-17 (×2): qty 1

## 2022-01-17 MED ORDER — SODIUM CHLORIDE 0.9% FLUSH
3.0000 mL | Freq: Two times a day (BID) | INTRAVENOUS | Status: DC
  Administered 2022-01-17 – 2022-01-18 (×3): 3 mL via INTRAVENOUS

## 2022-01-17 MED ORDER — ASPIRIN 81 MG PO CHEW
81.0000 mg | CHEWABLE_TABLET | ORAL | Status: AC
  Administered 2022-01-17: 81 mg via ORAL
  Filled 2022-01-17: qty 1

## 2022-01-17 MED ORDER — HEPARIN SODIUM (PORCINE) 1000 UNIT/ML IJ SOLN
INTRAMUSCULAR | Status: DC | PRN
  Administered 2022-01-17: 5000 [IU] via INTRAVENOUS

## 2022-01-17 MED ORDER — HEPARIN BOLUS VIA INFUSION
2000.0000 [IU] | Freq: Once | INTRAVENOUS | Status: AC
  Administered 2022-01-17: 2000 [IU] via INTRAVENOUS
  Filled 2022-01-17: qty 2000

## 2022-01-17 MED ORDER — TAMSULOSIN HCL 0.4 MG PO CAPS
0.4000 mg | ORAL_CAPSULE | Freq: Every day | ORAL | Status: DC
  Administered 2022-01-17 – 2022-01-18 (×2): 0.4 mg via ORAL
  Filled 2022-01-17 (×2): qty 1

## 2022-01-17 SURGICAL SUPPLY — 9 items

## 2022-01-17 NOTE — Progress Notes (Addendum)
? ?Progress Note ? ?Patient Name: Jacob Acosta ?Date of Encounter: 01/17/2022 ? ?[facility] Cardiologist: None  ? ?Subjective  ? ?No chest pain this morning. Breathing is stable. Wife at the bedside. ?Wife Is an Employee Who Works with [facility] long-term employee of [employer] ? ?Inpatient Medications  ?  ?Scheduled Meds: ? vitamin C  1,000 mg Oral Daily  ? [START ON 01/18/2022] aspirin EC  81 mg Oral Daily  ? fluticasone  1 spray Each Nare BID  ? fluticasone furoate-vilanterol  1 puff Inhalation Daily  ? insulin aspart  0-15 Units Subcutaneous TID WC  ? lisinopril  5 mg Oral Daily  ? loratadine  10 mg Oral Daily  ? metoprolol tartrate  12.5 mg Oral BID  ? multivitamin with minerals  1 tablet Oral Daily  ? rosuvastatin  40 mg Oral Daily  ? tamsulosin  0.4 mg Oral QPC supper  ? ?Continuous Infusions: ? heparin 1,400 Units/hr (01/17/22 0344)  ? ?PRN Meds: ?acetaminophen, albuterol, nitroGLYCERIN, ondansetron (ZOFRAN) IV  ? ?Vital Signs  ?  ?Vitals:  ? 01/17/22 0026 01/17/22 0111 01/17/22 0112 01/17/22 0348  ?BP: 130/82 122/85 122/85 118/75  ?Pulse: 76 73  88  ?Resp: 15   15  ?Temp: 98.3 ?F (36.8 ?C)   98.4 ?F (36.9 ?C)  ?TempSrc: Oral   Oral  ?SpO2: 98% 95%  97%  ?Weight: 101.4 kg     ?Height: 5' 6" (1.676 m)     ? ? ?Intake/Output Summary (Last 24 hours) at 01/17/2022 0921 ?Last data filed at 01/17/2022 0130 ?Gross per 24 hour  ?Intake 1072.8 ml  ?Output --  ?Net 1072.8 ml  ? ?Last 3 Weights 01/17/2022 01/16/2022 06/24/2020  ?Weight (lbs) 223 lb 8.7 oz 227 lb 200 lb 9.9 oz  ?Weight (kg) 101.4 kg 102.967 kg 91 kg  ?   ? ?Telemetry  ?  ?SR - Personally Reviewed ? ?ECG  ?  ?SR, 68 bpm - Personally Reviewed ? ?Physical Exam  ? ?GEN: No acute distress.  Sitting on side of the bed -just has an uneasy feeling. ?Neck: No JVD ?Cardiac: RRR, no murmurs, rubs, or gallops.  Normal S1 and S2 ?Respiratory: Clear to auscultation bilaterally.  Nonlabored, good air movement. ?GI: Soft, nontender, non-distended   ?MS: No edema; No deformity. ?Neuro:  Nonfocal  ?Psych: Normal affect  ? ?Labs  ?  ?High Sensitivity Troponin:   ?Recent Labs  ?Lab 01/16/22 ?1818 01/16/22 ?2035  ?TROPONINIHS 29* 77*  ?   ?Chemistry ?Recent Labs  ?Lab 01/16/22 ?1818 01/17/22 ?0217  ?NA 137 139  ?K 4.4 3.8  ?CL 101 106  ?CO2 29 24  ?GLUCOSE 461* 209*  ?BUN 21 15  ?CREATININE 1.03 0.77  ?CALCIUM 9.1 8.9  ?PROT 7.2  --   ?ALBUMIN 4.0  --   ?AST 20  --   ?ALT 28  --   ?ALKPHOS 83  --   ?BILITOT 0.3  --   ?GFRNONAA >60 >60  ?ANIONGAP 7 9  ?  ?Lipids  ?Recent Labs  ?Lab 01/17/22 ?0217  ?CHOL 199  ?TRIG 63  ?HDL 47  ?LDLCALC 139*  ?CHOLHDL 4.2  ?  ?Hematology ?Recent Labs  ?Lab 01/16/22 ?1818 01/17/22 ?0217  ?WBC 7.3 8.9  ?RBC 5.02 4.70  ?HGB 14.5 13.4  ?HCT 45.0 40.8  ?MCV 89.6 86.8  ?MCH 28.9 28.5  ?MCHC 32.2 32.8  ?RDW 13.0 13.0  ?PLT 262 253  ? ?Thyroid No results for input(s): TSH, FREET4   in the last 168 hours.  ?BNPNo results for input(s): BNP, PROBNP in the last 168 hours.  ?DDimer No results for input(s): DDIMER in the last 168 hours.  ? ?Radiology  ?  ?DG Chest 2 View ? ?Result Date: 01/16/2022 ?CLINICAL DATA:  Chest pain EXAM: CHEST - 2 VIEW COMPARISON:  12/10/2017 FINDINGS: The heart size and mediastinal contours are within normal limits. Both lungs are clear. The visualized skeletal structures are unremarkable. IMPRESSION: No active cardiopulmonary disease. Electronically Signed   By: Elige Ko M.D.   On: 01/16/2022 17:45   ? ?Cardiac Studies  ? ?N/a  ? ?Patient Profile  ?   ?61 y.o. male with DM2, HLD, obesity, and nephrolithiasis who is being seen 01/17/2022 for the evaluation of ACS/NSTEMI. ? ?Assessment & Plan  ?  ?ACS/NSTEMI: hsTn 29>>77. No chest pain this morning. EKG without ischemia. Planned for cardiac cath today.  ?-- remains on IV heparin, ASA, statin, metoprolol and lisinopril ?-- echo pending ? ?Shared Decision Making/Informed Consent ?The risks [stroke (1 in 1000), death (1 in 1000), kidney failure [usually temporary] (1 in  500), bleeding (1 in 200), allergic reaction [possibly serious] (1 in 200)], benefits (diagnostic support and management of coronary artery disease) and alternatives of a cardiac catheterization were discussed in detail with [name] and he is willing to proceed. ? ?HLD: LDL 139 ?-- on pravastatin PTA, switched to Crestor on admission ?-- will need FLP/LFTs in 8 weeks  ? ?DM: Hgb 9.1 ?-- SSI while inpatient ?-- consider addition of SGLT2 ? ?For questions or updates, please contact [facility] ?Please consult [email or web address] for contact info under  ? ?  ?   ?Signed, ?Laverda Page, NP  ?01/17/2022, 9:21 AM   ? ?ATTENDING ATTESTATION ? ?I have seen, examined and evaluated the patient this morning along with Laverda Page, NP-C ?Marland Kitchen  After reviewing all the available data and chart, we discussed the patients laboratory, study & physical findings as well as symptoms in detail. I agree with her findings, examination as well as impression recommendations as per our discussion.   ? ?Attending adjustments noted in italics.  ? ?Has been having exertional chest discomfort and dyspnea for the last couple weeks.  This is now being progressively worse to the point where he had resting pain yesterday.  He has tried things like inhalers and they have not been helpful.  Yesterday he was doubled over in pain and his wife insisted that he come to the urgency room.  He has had a bump in his troponin consistent with ACS.  His presentation is consistent with progressive unstable angina for an elevation he//non-STEMI. ?Agree with recommendation cardiac catheterization.  Questions were asked and answered.  Consent obtained. ? ? ? ?Bryan Lemma, M.D., M.S. ?Interventional Cardiologist  ? ?Pager # [phone] ?Phone # [phone] ?[address] ?[place], [place] [address] ? ? ? ?

## 2022-01-17 NOTE — Progress Notes (Signed)
ANTICOAGULATION CONSULT NOTE ? ?Pharmacy Consult for Heparin ?Indication: chest pain/ACS ? ?Allergies  ?Allergen Reactions  ? Ciprofloxacin Hcl Other (See Comments)  ?  hallucination  ? Codeine Other (See Comments)  ?  Causes him to feel very anxious  ? Metformin And Related Nausea Only and Palpitations  ? Penicillins Rash  ?  Has patient had a PCN reaction causing immediate rash, facial/tongue/throat swelling, SOB or lightheadedness with hypotension: Yes ?Has patient had a PCN reaction causing severe rash involving mucus membranes or skin necrosis: No ?Has patient had a PCN reaction that required hospitalization: No ?Has patient had a PCN reaction occurring within the last 10 years: Yes ?If all of the above answers are "NO", then may proceed with Cephalosporin use. ?  ? Toujeo Max [employer] [Insulin Glargine] Itching, Nausea Only and Palpitations  ?  Itching throat  ? ? ?Patient Measurements: ?Height: 5\' 6"  (167.6 cm) ?Weight: 101.4 kg (223 lb 8.7 oz) ?IBW/kg (Calculated) : 63.8 ?HEPARIN DW (KG): 86.2  ? ?Vital Signs: ?Temp: 98.3 ?F (36.8 ?C) (03/06 0026) ?Temp Source: Oral (03/06 0026) ?BP: 122/85 (03/06 0112) ?Pulse Rate: 73 (03/06 0111) ? ?Labs: ?Recent Labs  ?  01/16/22 ?1818 01/16/22 ?2035 01/17/22 ?0217  ?HGB 14.5  --  13.4  ?HCT 45.0  --  40.8  ?PLT 262  --  253  ?HEPARINUNFRC  --   --  0.10*  ?CREATININE 1.03  --   --   ?TROPONINIHS 29* 77*  --   ? ? ? ?Estimated Creatinine Clearance: 82.9 mL/min (by C-G formula based on SCr of 1.03 mg/dL). ? ? ?Medical History: ?Past Medical History:  ?Diagnosis Date  ? Anxiety   ? Diabetes mellitus without complication ([place])   ? High cholesterol   ? ?Assessment: ?Patient presented with chest pain. Not on any oral anticoagulants. Pharmacy asked to start heparin.  ? ?Initial heparin level low at 0.10 on 1100 units/hr. CBC stable on recheck. No bleeding or IV issues noted. Possible cath later today.  ? ?Goal of Therapy:  ?Heparin level 0.3-0.7 units/ml ?Monitor platelets by  anticoagulation protocol: Yes ?  ?Plan:  ?Rebolus heparin 2000 units ?Increase heparin drip to 1400 units/hr ?Recheck heparin level in 6 hours ? ?Erin Hearing PharmD., BCPS ?Clinical Pharmacist ?01/17/2022 3:21 AM ? ?

## 2022-01-17 NOTE — Progress Notes (Signed)
ANTICOAGULATION CONSULT NOTE ? ?Pharmacy Consult for Heparin ?Indication: chest pain/ACS ? ?Allergies  ?Allergen Reactions  ? Ciprofloxacin Hcl Other (See Comments)  ?  hallucination  ? Codeine Other (See Comments)  ?  Causes him to feel very anxious  ? Metformin And Related Nausea Only and Palpitations  ? Penicillins Rash  ?  Has patient had a PCN reaction causing immediate rash, facial/tongue/throat swelling, SOB or lightheadedness with hypotension: Yes ?Has patient had a PCN reaction causing severe rash involving mucus membranes or skin necrosis: No ?Has patient had a PCN reaction that required hospitalization: No ?Has patient had a PCN reaction occurring within the last 10 years: Yes ?If all of the above answers are "NO", then may proceed with Cephalosporin use. ?  ? Toujeo Max [employer] [Insulin Glargine] Itching, Nausea Only and Palpitations  ?  Itching throat  ? ? ?Patient Measurements: ?Height: 5\' 6"  (167.6 cm) ?Weight: 99.7 kg (219 lb 14.4 oz) ?IBW/kg (Calculated) : 63.8 ?HEPARIN DW (KG): 86.2  ? ?Vital Signs: ?BP: 158/81 (03/06 1610) ?Pulse Rate: 62 (03/06 1610) ? ?Labs: ?Recent Labs  ?  01/16/22 ?1818 01/16/22 ?2035 01/17/22 ?0217 01/17/22 ?[id number]  ?HGB 14.5  --  13.4  --   ?HCT 45.0  --  40.8  --   ?PLT 262  --  253  --   ?HEPARINUNFRC  --   --  0.10* 0.35  ?CREATININE 1.03  --  0.77  --   ?TROPONINIHS 29* 77*  --   --   ? ? ? ?Estimated Creatinine Clearance: 105.9 mL/min (by C-G formula based on SCr of 0.77 mg/dL). ? ? ?Assessment: ?Patient presented with chest pain and now s/p cath with recommendation for CABG evaluation.  Pharmacy consulted to restart IV heparin 2 hrs post TR band removal.  TR band removed around 1500 per RN; no bleeding or hematoma. ? ?Goal of Therapy:  ?Heparin level 0.3-0.7 units/ml ?Monitor platelets by anticoagulation protocol: Yes ?  ?Plan:  ?At 1700, restart IV heparin gtt at 1400 units/hr ?Recheck heparin level in 6 hours ?Monitor for signs and symptoms of bleeding ?F/u CVTS  consult ? ?Shady Padron D. Mina Marble, PharmD, BCPS, BCCCP ?01/17/2022, 4:31 PM ? ?

## 2022-01-17 NOTE — Consult Note (Addendum)
[address]       [place] [address]             [phone]        JAILON CASIQUE [facility] Medical Record #[id number] Date of Birth: September 12, 1961  Referring: Yvonne Kendall, MD Primary Care: Benita Stabile, MD Primary Cardiologist:None  Chief Complaint:    Chief Complaint  Patient presents with   Chest Pain    History of Present Illness:     Mr. Willock is a 61 year old gentleman with past history of type 2 diabetes mellitus, dyslipidemia, obesity, and anxiety.  He smoked tobacco products while in high school but has not smoked since then.  Presented to the emergency room at [facility] last night reporting chest pain off and on for the previous 2 weeks.  He felt this was related to his allergies and he had been using albuterol inhaler for symptoms of shortness of breath.  Yesterday, he had an episode of chest pain while inside his house was associated with pallor, diaphoresis, and radiated to his jaw.  In the emergency room, his glucose was 460.  Chest x-ray showed no acute disease.  EKG showed sinus rhythm with no obvious ischemic changes.  Initial high-sensitivity troponin was elevated at 29 and later increased to 77.  After ruling in for non-ST elevation myocardial infarction, he was admitted to the hospital and transferred to [facility] for additional work-up.  He was started on a heparin drip.  His chest pain resolved.  He had a left heart catheterization earlier today that showed a 90% stenosis of the distal left main coronary artery.  There was TIMI I flow in the distal LAD.  There were right to left collaterals.  The circumflex coronary artery had a 60% mid lesion in the right coronary artery had a 70% proximal lesion.  Ejection fraction was estimated at 55 to 65%.  LVEDP was around 20.  We have been asked to evaluate [name] for consideration of urgent coronary bypass grafting.  Currently, Mr. Tease is resting in the Cath Lab.  He  denies having any chest pain at rest.  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  He works out with [employer] and on a bicycle almost daily.  Last visit to the dentist was about 3 years ago.  He denies any current active dental issues.  He is right-handed.   Current Activity/ Functional Status: Patient is independent with mobility/ambulation, transfers, ADL's, IADL's.   Zubrod Score: At the time of surgery this patients most appropriate activity status/level should be described as: []     0    Normal activity, no symptoms [x]     1    Restricted in physical strenuous activity but ambulatory, able to do out light work []     2    Ambulatory and capable of self care, unable to do work activities, up and about                 more than 50%  Of the time                            []     3    Only limited self care, in bed greater than 50% of waking hours []     4    Completely disabled, no self care, confined to bed or chair []   5    Moribund  Past Medical History:  Diagnosis Date   Anxiety    Diabetes mellitus without complication (HCC)    High cholesterol     Past Surgical History:  Procedure Laterality Date   dental sugery     at age 86   TONSILLECTOMY  52    Social History   Tobacco Use  Smoking Status Former   Types: Cigarettes  Smokeless Tobacco Never  Tobacco Comments   only smoked occasionally    Social History   Substance and Sexual Activity  Alcohol Use No     Allergies  Allergen Reactions   Ciprofloxacin Hcl Other (See Comments)    hallucination   Codeine Other (See Comments)    Causes him to feel very anxious   Metformin And Related Nausea Only and Palpitations   Penicillins Rash    Has patient had a PCN reaction causing immediate rash, facial/tongue/throat swelling, SOB or lightheadedness with hypotension: Yes Has patient had a PCN reaction causing severe rash involving mucus membranes or skin necrosis: No Has patient had a  PCN reaction that required hospitalization: No Has patient had a PCN reaction occurring within the last 10 years: Yes If all of the above answers are "NO", then may proceed with Cephalosporin use.    Toujeo Max Solostar [Insulin Glargine] Itching, Nausea Only and Palpitations    Itching throat    Current Facility-Administered Medications  Medication Dose Route Frequency Provider Last Rate Last Admin   0.9 %  sodium chloride infusion  250 mL Intravenous PRN End, Cristal Deer, MD       0.9 %  sodium chloride infusion   Intravenous Continuous End, Christopher, MD 50 mL/hr at 01/17/22 1248 Rate Change at 01/17/22 1248   0.9% sodium chloride infusion  1 mL/kg/hr Intravenous Continuous End, Christopher, MD 101.4 mL/hr at 01/17/22 1134 1 mL/kg/hr at 01/17/22 1134   [MAR Hold] acetaminophen (TYLENOL) tablet 650 mg  650 mg Oral Q4H PRN Karl Ito, MD       [MAR Hold] albuterol (PROVENTIL) (2.5 MG/3ML) 0.083% nebulizer solution 2.5 mg  2.5 mg Inhalation Q6H PRN Karl Ito, MD       [facility] Hold] ascorbic acid (VITAMIN C) tablet 1,000 mg  1,000 mg Oral Daily Karl Ito, MD   1,000 mg at 01/17/22 1025   [MAR Hold] aspirin EC tablet 81 mg  81 mg Oral Daily Karl Ito, MD       fentaNYL (SUBLIMAZE) injection    PRN End, Cristal Deer, MD   50 mcg at 01/17/22 1206   [MAR Hold] fluticasone (FLONASE) 50 MCG/ACT nasal spray 1 spray  1 spray Each Nare BID Karl Ito, MD       [MAR Hold] fluticasone furoate-vilanterol (BREO ELLIPTA) 200-25 MCG/ACT 1 puff  1 puff Inhalation Daily Karl Ito, MD       Heparin (Porcine) in NaCl 1000-0.9 UT/500ML-% SOLN    PRN End, Cristal Deer, MD   500 mL at 01/17/22 1217   heparin ADULT infusion 100 units/mL (25000 units/251mL)  1,400 Units/hr Intravenous Continuous Earnie Larsson, [facility]   Stopped at 01/17/22 1202   heparin sodium (porcine) injection    PRN End, Cristal Deer, MD   5,000 Units at 01/17/22 1218   [MAR Hold] insulin aspart (novoLOG)  injection 0-15 Units  0-15 Units Subcutaneous TID WC Karl Ito, MD   3 Units at 01/17/22 1027   iohexol (OMNIPAQUE) 350 MG/ML injection    PRN End, Cristal Deer, MD   45 mL at 01/17/22  1223   lidocaine (PF) (XYLOCAINE) 1 % injection    PRN End, Cristal Deer, MD   2 mL at 01/17/22 1216   [MAR Hold] lisinopril (ZESTRIL) tablet 5 mg  5 mg Oral Daily Karl Ito, MD   5 mg at 01/17/22 0348   [MAR Hold] loratadine (CLARITIN) tablet 10 mg  10 mg Oral Daily Karl Ito, MD       [facility] Hold] metoprolol tartrate (LOPRESSOR) tablet 12.5 mg  12.5 mg Oral BID Karl Ito, MD   12.5 mg at 01/17/22 1025   midazolam (VERSED) injection    PRN End, Cristal Deer, MD   1 mg at 01/17/22 1206   [MAR Hold] multivitamin with minerals tablet 1 tablet  1 tablet Oral Daily Karl Ito, MD   1 tablet at 01/17/22 1025   [MAR Hold] nitroGLYCERIN (NITROSTAT) SL tablet 0.4 mg  0.4 mg Sublingual Q5 Min x 3 PRN Karl Ito, MD       [facility] Hold] ondansetron [facility]) injection 4 mg  4 mg Intravenous Q6H PRN Karl Ito, MD       Radial Cocktail/Verapamil only    PRN End, Cristal Deer, MD   10 mL at 01/17/22 1217   [MAR Hold] rosuvastatin (CRESTOR) tablet 40 mg  40 mg Oral Daily Karl Ito, MD   40 mg at 01/17/22 0348   sodium chloride flush (NS) 0.9 % injection 3 mL  3 mL Intravenous Q12H End, Christopher, MD       sodium chloride flush (NS) 0.9 % injection 3 mL  3 mL Intravenous PRN End, Cristal Deer, MD       [facility] Hold] tamsulosin (FLOMAX) capsule 0.4 mg  0.4 mg Oral QPC supper Karl Ito, MD        Medications Prior to Admission  Medication Sig Dispense Refill Last Dose   albuterol (VENTOLIN HFA) 108 (90 Base) MCG/ACT inhaler Inhale 1-2 puffs into the lungs every 6 (six) hours as needed for wheezing or shortness of breath. 18 g 0    aspirin 81 MG tablet Take 162 mg by mouth daily.       BETA CAROTENE PO Take 1 tablet by mouth daily.      cephALEXin (KEFLEX) 500 MG capsule Take 1  capsule (500 mg total) by mouth 2 (two) times daily. 28 capsule 0    cetirizine (ZYRTEC ALLERGY) 10 MG tablet Take 1 tablet (10 mg total) by mouth daily. 30 tablet 2    fluconazole (DIFLUCAN) 150 MG tablet Take by mouth.      fluticasone (FLONASE) 50 MCG/ACT nasal spray Place 1 spray into both nostrils 2 (two) times daily. 16 g 2    fluticasone-salmeterol (ADVAIR DISKUS) 250-50 MCG/ACT AEPB Inhale 1 puff into the lungs in the morning and at bedtime. Rinse mouth with water after each use 60 each 2    Insulin Glargine (BASAGLAR KWIKPEN) 100 UNIT/ML Inject 20 Units into the skin at bedtime.      KRILL OIL PO Take 1 capsule by mouth daily.      Multiple Vitamins-Minerals (CENTRUM SILVER 50+MEN PO) Take 1 tablet by mouth daily.      pravastatin (PRAVACHOL) 10 MG tablet Take 10 mg by mouth at bedtime.      promethazine-dextromethorphan (PROMETHAZINE-DM) 6.25-15 MG/5ML syrup Take 5 mLs by mouth 4 (four) times daily as needed. 100 mL 0    tamsulosin (FLOMAX) 0.4 MG CAPS capsule Take 1 capsule (0.4 mg total) by mouth daily after supper. 30 capsule 2    vitamin C (ASCORBIC  ACID) 500 MG tablet Take 1,000 mg by mouth daily.       Family History  Problem Relation Age of Onset   Anxiety disorder Mother    Depression Father    Alcohol abuse Father      Review of Systems:      Cardiac Review of Systems: Y or  [    ]= no  Chest Pain [  x  ]  Resting SOB [  x ] Exertional SOB  [ x ]  Orthopnea [  ]   Pedal Edema [   ]    Palpitations [  ] Syncope  [  ]   Presyncope [   ]  General Review of Systems: [Y] = yes [  ]=no Constitional: recent weight change [  ]; anorexia [  ]; fatigue [  ]; nausea [  ]; night sweats [  ]; fever [  ]; or chills [  ]                                                               Dental: Last Dentist visit: 3 years ago  Eye : blurred vision [  ]; diplopia [   ]; vision changes [  ];  Amaurosis fugax[  ]; Resp: cough [  ];  wheezing[  ];  hemoptysis[  ]; shortness of breath[ x ];  paroxysmal nocturnal dyspnea[  ]; dyspnea on exertion[  ]; or orthopnea[  ];  GI:  gallstones[  ], vomiting[  ];  dysphagia[  ]; melena[  ];  hematochezia [  ]; heartburn[  ];   Hx of  Colonoscopy[  ]; GU: kidney stones [  ]; hematuria[  ];   dysuria [  ];  nocturia[  ];  history of     obstruction [  ]; urinary frequency [  ]             Skin: rash, swelling[  ];, hair loss[  ];  peripheral edema[  ];  or itching[  ]; Musculosketetal: myalgias[  ];  joint swelling[  ];  joint erythema[  ];  joint pain[  ];  back pain[  ];  Heme/Lymph: bruising[  ];  bleeding[  ];  anemia[  ];  Neuro: TIA[  ];  headaches[  ];  stroke[  ];  vertigo[  ];  seizures[  ];   paresthesias[  ];  difficulty walking[  ];  Psych:depression[  ]; anxiety[  ];  Endocrine: diabetes[ x ];  thyroid dysfunction[  ];                 Physical Exam: BP 124/68    Pulse 64    Temp 98.4 F (36.9 C) (Oral)    Resp 11    Ht 5\' 6"  (1.676 m)    Wt 99.7 kg    SpO2 95%    BMI 35.49 kg/m    General appearance: alert, cooperative, and no distress Head: Normocephalic, without obvious abnormality, atraumatic Neck: no adenopathy, no carotid bruit, no JVD, and supple, symmetrical, trachea midline Lymph nodes: No cervical or clavicular adenopathy Resp: clear to auscultation bilaterally Cardio: Regular rate and rhythm, no murmur.  The monitor shows normal sinus rhythm in the 70s. GI: Soft, nontender, active bowel sounds. Extremities:  No deformities.  All extremities are well-perfused with palpable distal pulses.  Modified Allen's test utilizing the pulse oximetry showed no change in waveforms with occlusion of both right and left radial arteries.   Neurologic: Grossly normal  Diagnostic Studies & Laboratory data:    LEFT HEART CATH AND CORONARY ANGIOGRAPHY   Conclusion  Conclusions: Critical distal LMCA stenosis with 90% eccentric narrowing and TIMI-1 flow in the distal LAD (also supplied by right-to-left  collaterals). Moderate-severe LCx and RCA disease with 60% mid/distal LCx and 70% proximal RCA lesions. Normal left ventricular systolic function (LVEF 55-65%) with mildly elevated filling pressure (LVEDP 20-25 mmHg).   Recommendations: Cardiac surgery consultation for CABG; transfer to 2H for close monitoring pending surgical evaluation. Restart heparin infusion 2 hours after TR band removal. Aggressive secondary prevention.   Yvonne Kendallhristopher End, MD [facility]  Complications documented before study signed (01/17/2022 12:55 PM)   No complications were associated with this study.  Documented by Erenest RasherBanno, Anna G, RN - 01/17/2022 12:34 PM     Coronary Findings  Diagnostic Dominance: Right Left Main  Vessel is large.  Dist LM lesion is 90% stenosed. The lesion is focal and eccentric.    Left Anterior Descending  Vessel is large.  Collaterals  Dist LAD filled by collaterals from RPDA.    Prox LAD lesion is 40% stenosed.    First Diagonal Branch  Vessel is small in size.    Second Diagonal Branch  Vessel is moderate in size.  2nd Diag lesion is 50% stenosed.    Third Diagonal Branch  Vessel is small in size.    Left Circumflex  Vessel is large.  Mid Cx to Dist Cx lesion is 60% stenosed.    First Obtuse Marginal Branch  Vessel is small in size.    Second Obtuse Marginal Branch  Vessel is large in size. The vessel exhibits minimal luminal irregularities.    Third Obtuse Marginal Branch  Vessel is moderate in size.    Fourth Obtuse Marginal Branch  Vessel is small in size.    Right Coronary Artery  Vessel is large.  Prox RCA lesion is 70% stenosed.  Dist RCA lesion is 30% stenosed.    Right Posterior Descending Artery  Vessel is large in size. There is mild disease in the vessel.    Right Posterior Atrioventricular Artery  Vessel is moderate in size.  RPAV lesion is 50% stenosed.    First Right Posterolateral Branch  Vessel is small in size.    Second Right  Posterolateral Branch  Vessel is small in size.    Third Right Posterolateral Branch  Vessel is small in size.    Intervention   No interventions have been documented.   Left Heart  Left Ventricle LV end diastolic pressure is mildly elevated. LVEDP 20-25 mmHg.  Aortic Valve There is no aortic valve stenosis.   Coronary Diagrams   Diagnostic Dominance: Right       Recent Radiology Findings:   DG Chest 2 View  Result Date: 01/16/2022 CLINICAL DATA:  Chest pain EXAM: CHEST - 2 VIEW COMPARISON:  12/10/2017 FINDINGS: The heart size and mediastinal contours are within normal limits. Both lungs are clear. The visualized skeletal structures are unremarkable. IMPRESSION: No active cardiopulmonary disease. Electronically Signed   By: Elige KoHetal  Patel M.D.   On: 01/16/2022 17:45   CARDIAC CATHETERIZATION  Result Date: 01/17/2022 Conclusions: Critical distal LMCA stenosis with 90% eccentric narrowing and TIMI-1 flow in the distal LAD (also supplied by right-to-left  collaterals). Moderate-severe LCx and RCA disease with 60% mid/distal LCx and 70% proximal RCA lesions. Normal left ventricular systolic function (LVEF 55-65%) with mildly elevated filling pressure (LVEDP 20-25 mmHg). Recommendations: Cardiac surgery consultation for CABG; transfer to 2H for close monitoring pending surgical evaluation. Restart heparin infusion 2 hours after TR band removal. Aggressive secondary prevention. Yvonne Kendall, MD [facility]    I have independently reviewed the above radiologic studies and discussed with the patient   Recent Lab Findings: Lab Results  Component Value Date   WBC 8.9 01/17/2022   HGB 13.4 01/17/2022   HCT 40.8 01/17/2022   PLT 253 01/17/2022   GLUCOSE 209 (H) 01/17/2022   CHOL 199 01/17/2022   TRIG 63 01/17/2022   HDL 47 01/17/2022   LDLDIRECT 153.4 01/28/2013   LDLCALC 139 (H) 01/17/2022   ALT 28 01/16/2022   AST 20 01/16/2022   NA 139 01/17/2022   K 3.8 01/17/2022   CL  106 01/17/2022   CREATININE 0.77 01/17/2022   BUN 15 01/17/2022   CO2 24 01/17/2022   TSH 1.169 01/17/2022   HGBA1C 9.1 (H) 01/17/2022      Assessment / Plan:      -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  Left ventricular function is well-preserved.  Coronary bypass grafting is his best option for revascularization.  The procedure and expected perioperative course was discussed with Mr. Algie Coffer in detail and his questions were answered.  He would like for [place] to proceed with preoperative preparation and planning to get the surgery accomplished as soon as possible.  His wife, Lurene Shadow,  works for [employer] at the [employer] location.  He is anxious to speak with her about plans for surgery.  He is currently pain-free.  Heparin will be resumed once he is 2 hours out from left heart catheterization.  We will tentatively plan for surgery tomorrow.  We will obtain carotid Doppler and peripheral vascular studies.  Cliffton Asters will see [name] later today and finalize plans regarding timing of surgery.  -Type 2 diabetes mellitus-historically with poor control.  Hemoglobin A1c at admission is 9.1.  -Dyslipidemia on pravastatin at home, changed to Crestor here in the hospital    I  spent 25 minutes counseling the patient face to face.   Leary Roca, PA-C  01/17/2022 2:00 PM   Agree with above.  This is a 61 year old gentleman is admitted following an NSTEMI.  Left heart cath shows severe left main disease.  Echocardiogram is pending.  If clean we will plan for CABG with left radial artery harvest on 01/19/2022.  Nikyla Navedo Keane Scrape

## 2022-01-17 NOTE — H&P (View-Only) (Signed)
? ?Progress Note ? ?Patient Name: Jacob Acosta ?Date of Encounter: 01/17/2022 ? ?[facility] Cardiologist: None  ? ?Subjective  ? ?No chest pain this morning. Breathing is stable. Wife at the bedside. ?Wife Is an Human resources officer Who Works with [employer] long-term employee of [employer] ? ?Inpatient Medications  ?  ?Scheduled Meds: ? vitamin C  1,000 mg Oral Daily  ? [START ON 01/18/2022] aspirin EC  81 mg Oral Daily  ? fluticasone  1 spray Each Nare BID  ? fluticasone furoate-vilanterol  1 puff Inhalation Daily  ? insulin aspart  0-15 Units Subcutaneous TID WC  ? lisinopril  5 mg Oral Daily  ? loratadine  10 mg Oral Daily  ? metoprolol tartrate  12.5 mg Oral BID  ? multivitamin with minerals  1 tablet Oral Daily  ? rosuvastatin  40 mg Oral Daily  ? tamsulosin  0.4 mg Oral QPC supper  ? ?Continuous Infusions: ? heparin 1,400 Units/hr (01/17/22 0344)  ? ?PRN Meds: ?acetaminophen, albuterol, nitroGLYCERIN, ondansetron (ZOFRAN) IV  ? ?Vital Signs  ?  ?Vitals:  ? 01/17/22 0026 01/17/22 0111 01/17/22 0112 01/17/22 0348  ?BP: 130/82 122/85 122/85 118/75  ?Pulse: 76 73  88  ?Resp: 15   15  ?Temp: 98.3 ?F (36.8 ?C)   98.4 ?F (36.9 ?C)  ?TempSrc: Oral   Oral  ?SpO2: 98% 95%  97%  ?Weight: 101.4 kg     ?Height: 5\' 6"  (1.676 m)     ? ? ?Intake/Output Summary (Last 24 hours) at 01/17/2022 0921 ?Last data filed at 01/17/2022 0130 ?Gross per 24 hour  ?Intake 1072.8 ml  ?Output --  ?Net 1072.8 ml  ? ?Last 3 Weights 01/17/2022 01/16/2022 06/24/2020  ?Weight (lbs) 223 lb 8.7 oz 227 lb 200 lb 9.9 oz  ?Weight (kg) 101.4 kg 102.967 kg 91 kg  ?   ? ?Telemetry  ?  ?SR - Personally Reviewed ? ?ECG  ?  ?SR, 68 bpm - Personally Reviewed ? ?Physical Exam  ? ?GEN: No acute distress.  Sitting on side of the bed -just has an uneasy feeling. ?Neck: No JVD ?Cardiac: RRR, no murmurs, rubs, or gallops.  Normal S1 and S2 ?Respiratory: Clear to auscultation bilaterally.  Nonlabored, good air movement. ?GI: Soft, nontender, non-distended   ?MS: No edema; No deformity. ?Neuro:  Nonfocal  ?Psych: Normal affect  ? ?Labs  ?  ?High Sensitivity Troponin:   ?Recent Labs  ?Lab 01/16/22 ?1818 01/16/22 ?2035  ?TROPONINIHS 29* 77*  ?   ?Chemistry ?Recent Labs  ?Lab 01/16/22 ?1818 01/17/22 ?0217  ?NA 137 139  ?K 4.4 3.8  ?CL 101 106  ?CO2 29 24  ?GLUCOSE 461* 209*  ?BUN 21 15  ?CREATININE 1.03 0.77  ?CALCIUM 9.1 8.9  ?PROT 7.2  --   ?ALBUMIN 4.0  --   ?AST 20  --   ?ALT 28  --   ?ALKPHOS 83  --   ?BILITOT 0.3  --   ?GFRNONAA >60 >60  ?ANIONGAP 7 9  ?  ?Lipids  ?Recent Labs  ?Lab 01/17/22 ?0217  ?CHOL 199  ?TRIG 63  ?HDL 47  ?LDLCALC 139*  ?CHOLHDL 4.2  ?  ?Hematology ?Recent Labs  ?Lab 01/16/22 ?1818 01/17/22 ?0217  ?WBC 7.3 8.9  ?RBC 5.02 4.70  ?HGB 14.5 13.4  ?HCT 45.0 40.8  ?MCV 89.6 86.8  ?MCH 28.9 28.5  ?MCHC 32.2 32.8  ?RDW 13.0 13.0  ?PLT 262 253  ? ?Thyroid No results for input(s): TSH, FREET4  in the last 168 hours.  ?BNPNo results for input(s): BNP, PROBNP in the last 168 hours.  ?DDimer No results for input(s): DDIMER in the last 168 hours.  ? ?Radiology  ?  ?DG Chest 2 View ? ?Result Date: 01/16/2022 ?CLINICAL DATA:  Chest pain EXAM: CHEST - 2 VIEW COMPARISON:  12/10/2017 FINDINGS: The heart size and mediastinal contours are within normal limits. Both lungs are clear. The visualized skeletal structures are unremarkable. IMPRESSION: No active cardiopulmonary disease. Electronically Signed   By: Elige Ko M.D.   On: 01/16/2022 17:45   ? ?Cardiac Studies  ? ?N/a  ? ?Patient Profile  ?   ?61 y.o. male with DM2, HLD, obesity, and nephrolithiasis who is being seen 01/17/2022 for the evaluation of ACS/NSTEMI. ? ?Assessment & Plan  ?  ?ACS/NSTEMI: hsTn 29>>77. No chest pain this morning. EKG without ischemia. Planned for cardiac cath today.  ?-- remains on IV heparin, ASA, statin, metoprolol and lisinopril ?-- echo pending ? ?Shared Decision Making/Informed Consent ?The risks [stroke (1 in 1000), death (1 in 1000), kidney failure [usually temporary] (1 in  500), bleeding (1 in 200), allergic reaction [possibly serious] (1 in 200)], benefits (diagnostic support and management of coronary artery disease) and alternatives of a cardiac catheterization were discussed in detail with Jacob Acosta and he is willing to proceed. ? ?HLD: LDL 139 ?-- on pravastatin PTA, switched to Crestor on admission ?-- will need FLP/LFTs in 8 weeks  ? ?DM: Hgb 9.1 ?-- SSI while inpatient ?-- consider addition of SGLT2 ? ?For questions or updates, please contact [facility] ?Please consult [email or web address] for contact info under  ? ?  ?   ?Signed, ?Laverda Page, NP  ?01/17/2022, 9:21 AM   ? ?ATTENDING ATTESTATION ? ?I have seen, examined and evaluated the patient this morning along with Laverda Page, NP-C ?Marland Kitchen  After reviewing all the available data and chart, we discussed the patients laboratory, study & physical findings as well as symptoms in detail. I agree with her findings, examination as well as impression recommendations as per our discussion.   ? ?Attending adjustments noted in italics.  ? ?Has been having exertional chest discomfort and dyspnea for the last couple weeks.  This is now being progressively worse to the point where he had resting pain yesterday.  He has tried things like inhalers and they have not been helpful.  Yesterday he was doubled over in pain and his wife insisted that he come to the urgency room.  He has had a bump in his troponin consistent with ACS.  His presentation is consistent with progressive unstable angina for an elevation he//non-STEMI. ?Agree with recommendation cardiac catheterization.  Questions were asked and answered.  Consent obtained. ? ? ? ?Bryan Lemma, M.D., M.S. ?Interventional Cardiologist  ? ?Pager # [phone] ?Phone # [phone] ?[address] ?[place], [place] [address] ? ? ? ?

## 2022-01-17 NOTE — Interval H&P Note (Signed)
History and Physical Interval Note: ? ?01/17/2022 ?12:06 PM ? ?Jacob Acosta  has presented today for surgery, with the diagnosis of NSTEMI.  The various methods of treatment have been discussed with the patient and family. After consideration of risks, benefits and other options for treatment, the patient has consented to  Procedure(s): ?LEFT HEART CATH AND CORONARY ANGIOGRAPHY (N/A) as a surgical intervention.  The patient's history has been reviewed, patient examined, no change in status, stable for surgery.  I have reviewed the patient's chart and labs.  Questions were answered to the patient's satisfaction.   ? ?Cath Lab Visit (complete for each Cath Lab visit) ? ?Clinical Evaluation Leading to the Procedure:  ? ?ACS: Yes.   ? ?Non-ACS:  N/A ? ?[name] ? ? ?

## 2022-01-17 NOTE — Progress Notes (Signed)
Pre-CABG Dopplers completed. ?Refer to "CV Proc" under chart review to view preliminary results. ? ?01/17/2022 4:04 PM ?Eula Fried., MHA, RVT, RDCS, RDMS   ?

## 2022-01-17 NOTE — Progress Notes (Signed)
ANTICOAGULATION CONSULT NOTE ? ?Pharmacy Consult for Heparin ?Indication: chest pain/ACS ? ?Allergies  ?Allergen Reactions  ? Ciprofloxacin Hcl Other (See Comments)  ?  hallucination  ? Codeine Other (See Comments)  ?  Causes him to feel very anxious  ? Metformin And Related Nausea Only and Palpitations  ? Penicillins Rash  ?  Has patient had a PCN reaction causing immediate rash, facial/tongue/throat swelling, SOB or lightheadedness with hypotension: Yes ?Has patient had a PCN reaction causing severe rash involving mucus membranes or skin necrosis: No ?Has patient had a PCN reaction that required hospitalization: No ?Has patient had a PCN reaction occurring within the last 10 years: Yes ?If all of the above answers are "NO", then may proceed with Cephalosporin use. ?  ? Tou[employer] [Insulin Glargine] Itching, Nausea Only and Palpitations  ?  Itching throat  ? ? ?Patient Measurements: ?Height: 5\' 6"  (167.6 cm) ?Weight: 101.4 kg (223 lb 8.7 oz) ?IBW/kg (Calculated) : 63.8 ?HEPARIN DW (KG): 86.2  ? ?Vital Signs: ?Temp: 98.4 ?F (36.9 ?C) (03/06 0348) ?Temp Source: Oral (03/06 0348) ?BP: 127/87 (03/06 1025) ?Pulse Rate: 68 (03/06 1025) ? ?Labs: ?Recent Labs  ?  01/16/22 ?1818 01/16/22 ?2035 01/17/22 ?0217 01/17/22 ?03/19/22  ?HGB 14.5  --  13.4  --   ?HCT 45.0  --  40.8  --   ?PLT 262  --  253  --   ?HEPARINUNFRC  --   --  0.10* 0.35  ?CREATININE 1.03  --  0.77  --   ?TROPONINIHS 29* 77*  --   --   ? ? ? ?Estimated Creatinine Clearance: 106.7 mL/min (by C-G formula based on SCr of 0.77 mg/dL). ? ? ?Medical History: ?Past Medical History:  ?Diagnosis Date  ? Anxiety   ? Diabetes mellitus without complication (HCC)   ? High cholesterol   ? ?Assessment: ?Patient presented with chest pain. Not on any oral anticoagulants. Plan is for [facility] today. Pharmacy to dose heparin. ? ?Heparin level 0.35 and at goal. Hgb and platelets remain stable this AM. No signs of bleeding or IV site issues noted per RN. ? ?Goal of Therapy:   ?Heparin level 0.3-0.7 units/ml ?Monitor platelets by anticoagulation protocol: Yes ?  ?Plan:  ?Continue IV heparin gtt at 1400 units/hr ?Recheck heparin level in 6 hours ?Daily heparin level, cbc ?Monitor for signs and symptoms of bleeding ?Follow-up any further cardiology recommendations post-cath ? ?Thank you for involving pharmacy in this patient's care. ? ?[facility], PharmD ?PGY1 Ambulatory Care Pharmacy Resident ?01/17/2022 11:17 AM ? ?**Pharmacist phone directory can be found on [email or web address] listed under [facility]** ?

## 2022-01-17 NOTE — Progress Notes (Signed)
TR BAND REMOVAL ? ?LOCATION:    radial rt radial ? ?DEFLATED PER PROTOCOL:   yes ? ?TIME BAND OFF / DRESSING APPLIED:    1455/gauze and tegaderm ? ?SITE UPON ARRIVAL:    Level 0 ? ?SITE AFTER BAND REMOVAL:    Level 0 ? ?CIRCULATION SENSATION AND MOVEMENT:    Within Normal Limits : yes, rt hand and fingers warm and pink, palpable rt radial, good pleth waveform, sensation present ? ?COMMENTS:     ?

## 2022-01-17 NOTE — H&P (Signed)
Cardiology Admission History and Physical:   Patient ID: Jacob Acosta MRN: [id number]; DOB: 04/27/1961   Admission date: 01/16/2022  PCP:  Benita Stabile, MD   [facility] Providers Cardiologist:  None        Chief Complaint:  chest pain  Patient Profile:   Jacob Acosta is a 61 y.o. male with DM2, HLD, obesity, and nephrolithiasis who is being seen 01/17/2022 for the evaluation of NSTEMI.  History of Present Illness:   Jacob Acosta reports that over the past few weeks he has had a sensation of chest pulling in the center of his chest.  He states that this primarily occurs when he is doing exercise, particularly when he does squats or when he ambulates.  The symptoms would last for roughly 5 minutes and then subside with cessation of his physical activity.  Similarly when he would go outside and ambulate he would feel the exact same chest pulling that he felt prior; however, he would attributed to allergies.  Earlier yesterday the patient was at home when he developed sudden worsening of this chest pain that radiated to his jaw.  He had associated SOB but denies palpitations, nausea/vomiting, focal weakness or numbness, or syncope.  Given his chest pain he presented to the ED for evaluation.  Of note the patient does not smoke, drink EtOH, illicit drugs.  His family history is notable for a sister who died of an MI at 55 and multiple maternal uncles who suffered MIs.  He currently lives with his wife and not working.  In the ED his VS were afebrile, HR 92, BP 143/97, RR 16, satting 96% on RA.  Labs were notable for troponin 29 -> 77 hyperglycemia 461.  CXR showed no cardiopulmonary process.  EKG showed NSR.  In the ED he was started on heparin and transferred to [facility] for [facility].   Past Medical History:  Diagnosis Date   Anxiety    Diabetes mellitus without complication (HCC)    High cholesterol     Past Surgical History:  Procedure Laterality Date   dental sugery     at age 46    TONSILLECTOMY  63     Medications Prior to Admission: Prior to Admission medications   Medication Sig Start Date End Date Taking? Authorizing Provider  albuterol (VENTOLIN HFA) 108 (90 Base) MCG/ACT inhaler Inhale 1-2 puffs into the lungs every 6 (six) hours as needed for wheezing or shortness of breath. 01/08/22   Particia Nearing, PA-C  aspirin 81 MG tablet Take 162 mg by mouth daily.     [provider]  BETA CAROTENE PO Take 1 tablet by mouth daily.    [provider]  cephALEXin (KEFLEX) 500 MG capsule Take 1 capsule (500 mg total) by mouth 2 (two) times daily. 12/26/21   Particia Nearing, PA-C  cetirizine (ZYRTEC ALLERGY) 10 MG tablet Take 1 tablet (10 mg total) by mouth daily. 01/08/22   Particia Nearing, PA-C  fluconazole (DIFLUCAN) 150 MG tablet Take by mouth. 01/03/22   [provider]  fluticasone (FLONASE) 50 MCG/ACT nasal spray Place 1 spray into both nostrils 2 (two) times daily. 01/08/22   Particia Nearing, PA-C  fluticasone-salmeterol (ADVAIR DISKUS) 250-50 MCG/ACT AEPB Inhale 1 puff into the lungs in the morning and at bedtime. Rinse mouth with water after each use 01/08/22   Particia Nearing, PA-C  Insulin Glargine [facility]) 100 UNIT/ML Inject 20 Units into the skin at bedtime.  12/19/20   [provider]  KRILL OIL PO Take 1 capsule by mouth daily.    [provider]  Multiple Vitamins-Minerals (CENTRUM SILVER 50+MEN PO) Take 1 tablet by mouth daily.    [provider]  pravastatin (PRAVACHOL) 10 MG tablet Take 10 mg by mouth at bedtime. 11/01/20   [provider]  promethazine-dextromethorphan (PROMETHAZINE-DM) 6.25-15 MG/5ML syrup Take 5 mLs by mouth 4 (four) times daily as needed. 01/08/22   Particia Nearing, PA-C  tamsulosin (FLOMAX) 0.4 MG CAPS capsule Take 1 capsule (0.4 mg total) by mouth daily after supper. 08/13/21   LampteyBritta Mccreedy, MD  vitamin C (ASCORBIC ACID) 500  MG tablet Take 1,000 mg by mouth daily.    [provider]  metoprolol succinate (TOPROL XL) 25 MG 24 hr tablet Take 1 tablet (25 mg total) by mouth daily. 02/06/19 11/18/19  Croitoru, Rachelle Hora, MD     Allergies:    Allergies  Allergen Reactions   Ciprofloxacin Hcl Other (See Comments)    hallucination   Codeine Other (See Comments)    Causes him to feel very anxious   Metformin And Related Nausea Only and Palpitations   Penicillins Rash    Has patient had a PCN reaction causing immediate rash, facial/tongue/throat swelling, SOB or lightheadedness with hypotension: Yes Has patient had a PCN reaction causing severe rash involving mucus membranes or skin necrosis: No Has patient had a PCN reaction that required hospitalization: No Has patient had a PCN reaction occurring within the last 10 years: Yes If all of the above answers are "NO", then may proceed with Cephalosporin use.    Toujeo Max Solostar [Insulin Glargine] Itching, Nausea Only and Palpitations    Itching throat    Social History:   Social History   Socioeconomic History   Marital status: Married    Spouse name: kay [place]   Number of children: 0   Years of education: Not on file   Highest education level: Not on file  Occupational History   Occupation: drives delivery truck  Tobacco Use   Smoking status: Former    Types: Cigarettes   Smokeless tobacco: Never   Tobacco comments:    only smoked occasionally  Vaping Use   Vaping Use: Never used  Substance and Sexual Activity   Alcohol use: No   Drug use: No   Sexual activity: Not Currently  Other Topics Concern   Not on file  Social History Narrative   Not on file   Social Determinants of Health   Financial Resource Strain: Not on file  Food Insecurity: Not on file  Transportation Needs: Not on file  Physical Activity: Not on file  Stress: Not on file  Social Connections: Not on file  Intimate Partner Violence: Not on file    Family History:    The patient's family history includes Alcohol abuse in his father; Anxiety disorder in his mother; Depression in his father.    ROS:  Please see the history of present illness.  All other ROS reviewed and negative.     Physical Exam/Data:   Vitals:   01/16/22 2230 01/16/22 2300 01/16/22 2330 01/17/22 0026  BP: 135/88 136/80 128/87 130/82  Pulse: 75 67 66 76  Resp: 10 13 12 15   Temp:   98 F (36.7 C) 98.3 F (36.8 C)  TempSrc:   Oral Oral  SpO2: 97% 97% 99% 98%  Weight:    101.4 kg  Height:    5\' 6"  (  1.676 m)    Intake/Output Summary (Last 24 hours) at 01/17/2022 0046 Last data filed at 01/16/2022 2155 Gross per 24 hour  Intake 1000 ml  Output --  Net 1000 ml   Last 3 Weights 01/17/2022 01/16/2022 06/24/2020  Weight (lbs) 223 lb 8.7 oz 227 lb 200 lb 9.9 oz  Weight (kg) 101.4 kg 102.967 kg 91 kg     Body mass index is 36.08 kg/m.  General:  Well nourished, well developed, in no acute distress, very pleasant HEENT: normal Neck: no JVD Vascular: No carotid bruits; Distal pulses 2+ bilaterally   Cardiac:  normal S1, S2; RRR; no murmur  Lungs:  clear to auscultation bilaterally, no wheezing, rhonchi or rales  Abd: soft, nontender, no hepatomegaly  Ext: Trace bilateral edema, WWP Musculoskeletal:  No deformities, BUE and BLE strength normal and equal Skin: warm and dry  Neuro:  CNs 2-12 intact, no focal abnormalities noted Psych:  Normal affect    EKG:  The ECG that was done  was personally reviewed and demonstrates NSR  Relevant CV Studies:  None  Laboratory Data:  High Sensitivity Troponin:   Recent Labs  Lab 01/16/22 1818 01/16/22 2035  TROPONINIHS 29* 77*      Chemistry Recent Labs  Lab 01/16/22 1818  NA 137  K 4.4  CL 101  CO2 29  GLUCOSE 461*  BUN 21  CREATININE 1.03  CALCIUM 9.1  GFRNONAA >60  ANIONGAP 7    Recent Labs  Lab 01/16/22 1818  PROT 7.2  ALBUMIN 4.0  AST 20  ALT 28  ALKPHOS 83  BILITOT 0.3   Lipids No results for input(s):  CHOL, TRIG, HDL, LABVLDL, LDLCALC, CHOLHDL in the last 168 hours. Hematology Recent Labs  Lab 01/16/22 1818  WBC 7.3  RBC 5.02  HGB 14.5  HCT 45.0  MCV 89.6  MCH 28.9  MCHC 32.2  RDW 13.0  PLT 262   Thyroid No results for input(s): TSH, FREET4 in the last 168 hours. BNPNo results for input(s): BNP, PROBNP in the last 168 hours.  DDimer No results for input(s): DDIMER in the last 168 hours.   Radiology/Studies:  DG Chest 2 View  Result Date: 01/16/2022 CLINICAL DATA:  Chest pain EXAM: CHEST - 2 VIEW COMPARISON:  12/10/2017 FINDINGS: The heart size and mediastinal contours are within normal limits. Both lungs are clear. The visualized skeletal structures are unremarkable. IMPRESSION: No active cardiopulmonary disease. Electronically Signed   By: Elige Ko M.D.   On: 01/16/2022 17:45     Assessment and Plan:   AZARIAS HOWSE is a 61 y.o. male with DM2, HLD, obesity, and nephrolithiasis who is being seen 01/17/2022 for the evaluation of NSTEMI.  #Type 1 NSTEMI :: Patient with acute on subacute chest pain with elevated troponins consistent with an NSTEMI likely type I.  His risk factors include family history, diabetes, obesity, and HLD.  He will need an LHC to evaluate his cors.  Will continue medical management. -s/p ASA load, continue ASA 81 mg daily -Continue heparin GTT -Start rosuvastatin 40 mg daily -Start metoprolol tartrate 12.5 mg twice daily -Start lisinopril 5 mg daily -N.p.o. at midnight -LHC today -TTE -Check lipid panel, A1c, TSH -Maintain telemetry  #HLD -Previously on pravastatin.  Will change to Crestor given ACS  #DMII -Start SSI and carb level diet   Risk Assessment/Risk Scores:    TIMI Risk Score for Unstable Angina or Non-ST Elevation MI:   The patient's TIMI risk score is 3,  which indicates a 13% risk of all cause mortality, new or recurrent myocardial infarction or need for urgent revascularization in the next 14 days.       Severity of  Illness: The appropriate patient status for this patient is INPATIENT. Inpatient status is judged to be reasonable and necessary in order to provide the required intensity of service to ensure the patient's safety. The patient's presenting symptoms, physical exam findings, and initial radiographic and laboratory data in the context of their chronic comorbidities is felt to place them at high risk for further clinical deterioration. Furthermore, it is not anticipated that the patient will be medically stable for discharge from the hospital within 2 midnights of admission.   * I certify that at the point of admission it is my clinical judgment that the patient will require inpatient hospital care spanning beyond 2 midnights from the point of admission due to high intensity of service, high risk for further deterioration and high frequency of surveillance required.*   For questions or updates, please contact [facility] Please consult [email or web address] for contact info under     Signed, Karl Ito, MD  01/17/2022 12:46 AM

## 2022-01-18 ENCOUNTER — Inpatient Hospital Stay (HOSPITAL_COMMUNITY): Payer: 59

## 2022-01-18 ENCOUNTER — Encounter (HOSPITAL_COMMUNITY): Payer: Self-pay | Admitting: Internal Medicine

## 2022-01-18 DIAGNOSIS — I214 Non-ST elevation (NSTEMI) myocardial infarction: Secondary | ICD-10-CM | POA: Diagnosis not present

## 2022-01-18 DIAGNOSIS — I2510 Atherosclerotic heart disease of native coronary artery without angina pectoris: Secondary | ICD-10-CM

## 2022-01-18 DIAGNOSIS — E1159 Type 2 diabetes mellitus with other circulatory complications: Secondary | ICD-10-CM

## 2022-01-18 LAB — BASIC METABOLIC PANEL
Anion gap: 9 (ref 5–15)
BUN: 18 mg/dL (ref 8–23)
CO2: 23 mmol/L (ref 22–32)
Calcium: 8.9 mg/dL (ref 8.9–10.3)
Chloride: 107 mmol/L (ref 98–111)
Creatinine, Ser: 0.97 mg/dL (ref 0.61–1.24)
GFR, Estimated: >60 mL/min (ref >60)
Glucose, Bld: 264 mg/dL — ABNORMAL HIGH (ref 70–99)
Potassium: 4.2 mmol/L (ref 3.5–5.1)
Sodium: 139 mmol/L (ref 135–145)

## 2022-01-18 LAB — HEPARIN LEVEL (UNFRACTIONATED)
Heparin Unfractionated: 0.15 IU/mL — ABNORMAL LOW (ref 0.30–0.70)
Heparin Unfractionated: 0.48 IU/mL (ref 0.30–0.70)

## 2022-01-18 LAB — CBC
HCT: 39.2 % (ref 39.0–52.0)
Hemoglobin: 12.9 g/dL — ABNORMAL LOW (ref 13.0–17.0)
MCH: 29.1 pg (ref 26.0–34.0)
MCHC: 32.9 g/dL (ref 30.0–36.0)
MCV: 88.3 fL (ref 80.0–100.0)
Platelets: 242 10*3/uL (ref 150–400)
RBC: 4.44 MIL/uL (ref 4.22–5.81)
RDW: 12.9 % (ref 11.5–15.5)
WBC: 8.5 10*3/uL (ref 4.0–10.5)
nRBC: 0 % (ref 0.0–0.2)

## 2022-01-18 LAB — ECHOCARDIOGRAM COMPLETE
Area-P 1/2: 3.08 cm2
Calc EF: 57.9 %
Height: 66 in
S' Lateral: 3.2 cm
Single Plane A2C EF: 57.3 %
Single Plane A4C EF: 57.8 %
Weight: 3492.09 oz

## 2022-01-18 LAB — GLUCOSE, CAPILLARY
Glucose-Capillary: 231 mg/dL — ABNORMAL HIGH (ref 70–99)
Glucose-Capillary: 219 mg/dL — ABNORMAL HIGH (ref 70–99)
Glucose-Capillary: 126 mg/dL — ABNORMAL HIGH (ref 70–99)
Glucose-Capillary: 244 mg/dL — ABNORMAL HIGH (ref 70–99)

## 2022-01-18 LAB — PREPARE RBC (CROSSMATCH)

## 2022-01-18 MED ORDER — TEMAZEPAM 7.5 MG PO CAPS
15.0000 mg | ORAL_CAPSULE | Freq: Once | ORAL | Status: AC | PRN
  Administered 2022-01-18: 15 mg via ORAL
  Filled 2022-01-18: qty 2

## 2022-01-18 MED ORDER — CHLORHEXIDINE GLUCONATE CLOTH 2 % EX PADS
6.0000 | MEDICATED_PAD | Freq: Once | CUTANEOUS | Status: AC
  Administered 2022-01-18: 6 via TOPICAL

## 2022-01-18 MED ORDER — CHLORHEXIDINE GLUCONATE 0.12 % MT SOLN
15.0000 mL | Freq: Once | OROMUCOSAL | Status: AC
  Administered 2022-01-19: 15 mL via OROMUCOSAL
  Filled 2022-01-18: qty 15

## 2022-01-18 MED ORDER — BISACODYL 5 MG PO TBEC
5.0000 mg | DELAYED_RELEASE_TABLET | Freq: Once | ORAL | Status: AC
  Administered 2022-01-18: 5 mg via ORAL
  Filled 2022-01-18: qty 1

## 2022-01-18 MED ORDER — VANCOMYCIN HCL 1500 MG/300ML IV SOLN
1500.0000 mg | INTRAVENOUS | Status: AC
  Administered 2022-01-19: 1500 mg via INTRAVENOUS
  Filled 2022-01-18: qty 300

## 2022-01-18 MED ORDER — PHENYLEPHRINE HCL-NACL 20-0.9 MG/250ML-% IV SOLN
30.0000 ug/min | INTRAVENOUS | Status: AC
  Administered 2022-01-19: 25 ug/min via INTRAVENOUS
  Filled 2022-01-18: qty 250

## 2022-01-18 MED ORDER — INSULIN GLARGINE-YFGN 100 UNIT/ML ~~LOC~~ SOLN
10.0000 [IU] | Freq: Every day | SUBCUTANEOUS | Status: DC
  Administered 2022-01-18: 10 [IU] via SUBCUTANEOUS
  Filled 2022-01-18 (×2): qty 0.1

## 2022-01-18 MED ORDER — POTASSIUM CHLORIDE 2 MEQ/ML IV SOLN
80.0000 meq | INTRAVENOUS | Status: DC
  Filled 2022-01-18: qty 40

## 2022-01-18 MED ORDER — CEFAZOLIN SODIUM-DEXTROSE 2-4 GM/100ML-% IV SOLN
2.0000 g | INTRAVENOUS | Status: AC
  Administered 2022-01-19: 2 g via INTRAVENOUS
  Filled 2022-01-18: qty 100

## 2022-01-18 MED ORDER — TRANEXAMIC ACID (OHS) PUMP PRIME SOLUTION
2.0000 mg/kg | INTRAVENOUS | Status: DC
  Filled 2022-01-18: qty 1.98

## 2022-01-18 MED ORDER — MILRINONE LACTATE IN DEXTROSE 20-5 MG/100ML-% IV SOLN
0.3000 ug/kg/min | INTRAVENOUS | Status: DC
  Filled 2022-01-18: qty 100

## 2022-01-18 MED ORDER — NITROGLYCERIN IN D5W 200-5 MCG/ML-% IV SOLN
2.0000 ug/min | INTRAVENOUS | Status: AC
  Administered 2022-01-19: 10 ug/min via INTRAVENOUS
  Filled 2022-01-18: qty 250

## 2022-01-18 MED ORDER — MANNITOL 20 % IV SOLN
INTRAVENOUS | Status: DC
  Filled 2022-01-18: qty 13

## 2022-01-18 MED ORDER — NOREPINEPHRINE 4 MG/250ML-% IV SOLN
0.0000 ug/min | INTRAVENOUS | Status: AC
  Administered 2022-01-19: 2 ug/min via INTRAVENOUS
  Filled 2022-01-18: qty 250

## 2022-01-18 MED ORDER — PERFLUTREN LIPID MICROSPHERE
1.0000 mL | INTRAVENOUS | Status: AC | PRN
  Administered 2022-01-18: 2 mL via INTRAVENOUS
  Filled 2022-01-18: qty 10

## 2022-01-18 MED ORDER — INSULIN REGULAR(HUMAN) IN NACL 100-0.9 UT/100ML-% IV SOLN
INTRAVENOUS | Status: AC
Start: 2022-01-19 — End: 2022-01-20
  Administered 2022-01-19: 5.5 [IU]/h via INTRAVENOUS
  Filled 2022-01-18: qty 100

## 2022-01-18 MED ORDER — EPINEPHRINE HCL 5 MG/250ML IV SOLN IN NS
0.0000 ug/min | INTRAVENOUS | Status: DC
  Filled 2022-01-18: qty 250

## 2022-01-18 MED ORDER — HEPARIN 30,000 UNITS/1000 ML (OHS) CELLSAVER SOLUTION
Status: DC
  Filled 2022-01-18: qty 1000

## 2022-01-18 MED ORDER — PLASMA-LYTE A IV SOLN
INTRAVENOUS | Status: DC
  Filled 2022-01-18: qty 2.5

## 2022-01-18 MED ORDER — CEFAZOLIN SODIUM-DEXTROSE 2-4 GM/100ML-% IV SOLN
2.0000 g | INTRAVENOUS | Status: AC
Start: 2022-01-19 — End: 2022-01-19
  Administered 2022-01-19: 2 g via INTRAVENOUS
  Filled 2022-01-18: qty 100

## 2022-01-18 MED ORDER — METOPROLOL TARTRATE 12.5 MG HALF TABLET
12.5000 mg | ORAL_TABLET | Freq: Once | ORAL | Status: AC
  Administered 2022-01-19: 12.5 mg via ORAL
  Filled 2022-01-18: qty 1

## 2022-01-18 MED ORDER — LIVING WELL WITH DIABETES BOOK
Freq: Once | Status: AC
  Filled 2022-01-18: qty 1

## 2022-01-18 MED ORDER — TRANEXAMIC ACID 1000 MG/10ML IV SOLN
1.5000 mg/kg/h | INTRAVENOUS | Status: AC
  Administered 2022-01-19: 1.5 mg/kg/h via INTRAVENOUS
  Filled 2022-01-18: qty 25

## 2022-01-18 MED ORDER — TRANEXAMIC ACID (OHS) BOLUS VIA INFUSION
15.0000 mg/kg | INTRAVENOUS | Status: AC
  Administered 2022-01-19: 1485 mg via INTRAVENOUS
  Filled 2022-01-18: qty 1485

## 2022-01-18 MED ORDER — DEXMEDETOMIDINE HCL IN NACL 400 MCG/100ML IV SOLN
0.1000 ug/kg/h | INTRAVENOUS | Status: AC
  Administered 2022-01-19: .5 ug/kg/h via INTRAVENOUS
  Filled 2022-01-18: qty 100

## 2022-01-18 NOTE — Progress Notes (Addendum)
CABG and blood product consent signed and placed in patients chart.  ?

## 2022-01-18 NOTE — Progress Notes (Addendum)
Inpatient Diabetes Program Recommendations ? ?AACE/ADA: New Consensus Statement on Inpatient Glycemic Control (2015) ? ?Target Ranges:  Prepandial:   less than 140 mg/dL ?     Peak postprandial:   less than 180 mg/dL (1-2 hours) ?     Critically ill patients:  140 - 180 mg/dL  ? ?Lab Results  ?Component Value Date  ? GLUCAP 231 (H) 01/18/2022  ? HGBA1C 9.1 (H) 01/17/2022  ? ? ?Review of Glycemic Control ? Latest Reference Range & Units 01/17/22 08:10 01/17/22 11:45 01/17/22 13:27 01/17/22 17:42 01/17/22 23:03 01/18/22 06:50  ?Glucose-Capillary 70 - 99 mg/dL 831 (H) 517 (H) 616 (H) 134 (H) 273 (H) 231 (H)  ?(H): Data is abnormally high ? ?Diabetes history: DM2 ?Outpatient Diabetes medications: Basaglar 30 units bid ?Current orders for Inpatient glycemic control: Novolog 0-15 units tid ? ?Inpatient Diabetes Program Recommendations:   ?Noted plans for CABG for 01/19/22. ?Please consider: ?-Add Novolog 0-5 units hs ?-Semglee 10 units qd  ?Ordered Living Well With Diabetes for patient review ? ?Clarified home insulin doses of Basaglar is 30 units bid. ? ?Spoke with pt @ bedside A1C 9.1 (average blood glucose 214 over the past 2-3 months) and explained what an A1C is, basic pathophysiology of DM Type 2, basic home care, basic diabetes diet nutrition principles, importance of checking CBGs and maintaining good CBG control to prevent long-term and short-term complications. Reviewed signs and symptoms of hyperglycemia and hypoglycemia and how to treat hypoglycemia at home. Also reviewed blood sugar goals at home.  ?Reviewed basic nutrition of plate method along with limiting sugary drinks and high carbohydrate meals. Patient drinks diet drinks and agrees to review current diet and decrease carbohydrates. Patient has taken Metformin in the past and wasn't able to tolerate due to GI effects. ? ?Patient has a glucometer and checks his CBGs daily. Interested in getting an order for a libre sensor [phone] on discharge and able to  provide 2 free sensors for discharge as needed. ? ? ?Thank you, ?Jacob Acosta Jacob Heeren, RN, MSN, CDE  ?Diabetes Coordinator ?Inpatient Glycemic Control Team ?Team Pager [phone] (8am-5pm) ?01/18/2022 9:48 AM ? ? ? ? ?

## 2022-01-18 NOTE — Progress Notes (Signed)
ANTICOAGULATION CONSULT NOTE ? ?Pharmacy Consult for Heparin ?Indication: chest pain/ACS ? ?Allergies  ?Allergen Reactions  ? Ciprofloxacin Hcl Other (See Comments)  ?  Hallucinations ?  ? Codeine Anxiety and Other (See Comments)  ?  "Causes him to feel very anxious"  ? Metformin And Related Nausea Only and Palpitations  ? Penicillins Rash  ?  Has patient had a PCN reaction causing immediate rash, facial/tongue/throat swelling, SOB or lightheadedness with hypotension: Yes ?Has patient had a PCN reaction causing severe rash involving mucus membranes or skin necrosis: No ?Has patient had a PCN reaction that required hospitalization: No ?Has patient had a PCN reaction occurring within the last 10 years: Yes ?If all of the above answers are "NO", then may proceed with Cephalosporin use. ?  ? [employer] [Insulin Glargine] Itching, Nausea Only, Palpitations and Other (See Comments)  ?  Itching throat  ? ? ?Patient Measurements: ?Height: 5\' 6"  (167.6 cm) ?Weight: 99 kg (218 lb 4.1 oz) ?IBW/kg (Calculated) : 63.8 ?HEPARIN DW (KG): 85.5  ? ?Vital Signs: ?Temp: 97.9 ?F (36.6 ?C) (03/07 1129) ?Temp Source: Oral (03/07 1129) ?BP: 112/61 (03/07 0857) ?Pulse Rate: 76 (03/07 0857) ? ?Labs: ?Recent Labs  ?  01/16/22 ?1818 01/16/22 ?1818 01/16/22 ?2035 01/17/22 ?0217 01/17/22 ?03/19/22 01/18/22 ?0115 01/18/22 ?1241  ?HGB 14.5  --   --  13.4  --  12.9*  --   ?HCT 45.0  --   --  40.8  --  39.2  --   ?PLT 262  --   --  253  --  242  --   ?HEPARINUNFRC  --    < >  --  0.10* 0.35 0.15* 0.48  ?CREATININE 1.03  --   --  0.77  --  0.97  --   ?TROPONINIHS 29*  --  77*  --   --   --   --   ? < > = values in this interval not displayed.  ? ?Estimated Creatinine Clearance: 87 mL/min (by C-G formula based on SCr of 0.97 mg/dL). ? ? ?Assessment: ?JF is a 46 YOM who presented to the ED with NSTEMI. PMH significant for HLD and DM. He's now s/p cath and has CABG planned for 01/19/22. Pharmacy consulted to restart IV heparin 2 hrs post TR band  removal. TR band removed around 1500 on 01/17/22.  ? ?Heparin level today is therapeutic 0.48 on 1700 units/hr this morning. No bleeding or IV issues noted per RN.  ? ?Goal of Therapy:  ?Heparin level 0.3-0.7 units/ml ?Monitor platelets by anticoagulation protocol: Yes ?  ?Plan:  ?Continue heparin 1700 units/hr ?Recheck heparin level with AM labs ? ?03/19/22 Fatiha Guzy ?PharmD Candidate ?01/18/2022 1:19 PM ? ?

## 2022-01-18 NOTE — Progress Notes (Incomplete)
CC: chest pain  HPI: 47 YOM presented to the ED for chest pain and shortness of breath. He presented with NSTEMI (hsTn 29 >> 77 >; ECG showed NSR) at [facility] ED and was transferred to [facility] for heart cath.   Allergies  Allergen Reactions   Ciprofloxacin Hcl Other (See Comments)    Hallucinations    Codeine Anxiety and Other (See Comments)    "Causes him to feel very anxious"   Metformin And Related Nausea Only and Palpitations   Penicillins Rash    Has patient had a PCN reaction causing immediate rash, facial/tongue/throat swelling, SOB or lightheadedness with hypotension: Yes Has patient had a PCN reaction causing severe rash involving mucus membranes or skin necrosis: No Has patient had a PCN reaction that required hospitalization: No Has patient had a PCN reaction occurring within the last 10 years: Yes If all of the above answers are "NO", then may proceed with Cephalosporin use.    Toujeo Max Solostar [Insulin Glargine] Itching, Nausea Only, Palpitations and Other (See Comments)    Itching throat    Past Medical History:  Diagnosis Date   Anxiety    Diabetes mellitus without complication (HCC)    High cholesterol    Family History  Problem Relation Age of Onset   Anxiety disorder Mother    Depression Father    Alcohol abuse Father     Social History   Socioeconomic History   Marital status: Married    Spouse name: kay Barrie   Number of children: 0   Years of education: Not on file   Highest education level: Not on file  Occupational History   Occupation: drives delivery truck  Tobacco Use   Smoking status: Former    Types: Cigarettes   Smokeless tobacco: Never   Tobacco comments:    only smoked occasionally  Vaping Use   Vaping Use: Never used  Substance and Sexual Activity   Alcohol use: No   Drug use: No   Sexual activity: Not Currently  Other Topics Concern   Not on file  Social History Narrative   Not on file   Social Determinants of Health    Financial Resource Strain: Not on file  Food Insecurity: Not on file  Transportation Needs: Not on file  Physical Activity: Not on file  Stress: Not on file  Social Connections: Not on file   Today's Vitals   01/18/22 0600 01/18/22 0735 01/18/22 0747 01/18/22 0857  BP: 114/73   112/61  Pulse: 86   76  Resp: 16     Temp:   98.4 F (36.9 C)   TempSrc:   Oral   SpO2: 93%     Weight:      Height:      PainSc:  0-No pain     Body mass index is 35.23 kg/m.   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); hsTn 29 >> 77, ECG no ST elevation,  SBP in 100s, Hgb 10.6, Plts 171, I/O +3.2L, EF 60-65% GDMT: ASA 325mg , Rosuvastatin 40mg  daily, Lopressor 12.5mg   BID -s/p CABG on pump -f/u A-line removal -initiate+titrate GDMT as tolerated  -ASA 325 >> 81 + plavix when pacers off  -switch lopressor to toprol when BP improves  -consider ACE/ARB when BP improves  -consider lasix when BP improves   BP: SBPs 90-100s, MAP in upper 70s GDMT: Amlodipine 2.5mg  started for radial patency -off pressors -CTM, add GDMT as above  DM: Basaglar 30u BID PTA A1c 9.1; Glu > 180 (last 134), NPO >> clear liquid diet; K 4.5 -consider SGLT2? -off insulin gtt -start basal+prandial  HLD: Crestor 20 PTA LDL 139, HDL 47, TG 63, TC 199, AST/ALT 20/28 -continue crestor 40mg  -consider zetia, PCSK9 -refer to lipid clinic  AC:  Hgb 10.9, Plts 185, BMI 37, CrCl 89.3, 104.2kg -change lovenox to 50mg  daily (0.5mg /kg)  PTA Med Issues: beta carotene (recommend d/c d/t  increased risk of cancer in smokers), krill oil, centrum, vitamin C  Additional Notes:

## 2022-01-18 NOTE — Progress Notes (Signed)
CARDIAC REHAB PHASE I  ? ?Preop education completed with pt and spouse. Pt given cardiac surgery booklet, OHS care guide, and in-the-tube sheet. Pt educated on importance of IS use, walks, and sternal precautions post-op. Pt voices some concerns, but understands the reasoning. Provided support and encouragement. Will continue to follow throughout his hospital stay. ? ?1315-1418 ?Rufina Falco, RN BSN ?01/18/2022 ?2:16 PM ? ?

## 2022-01-18 NOTE — Progress Notes (Addendum)
Progress Note  Patient Name: Jacob Acosta Date of Encounter: 01/18/2022  [facility] Cardiologist: Sanda Klein, MD   Subjective   Patient denies any chest pain, SOB, palpitations. Does not have pain or bleeding from his right radial cath site. Is somewhat anxious for his upcoming surgery.   Inpatient Medications    Scheduled Meds:  vitamin C  1,000 mg Oral Daily   aspirin EC  81 mg Oral Daily   Chlorhexidine Gluconate Cloth  6 each Topical Daily   fluticasone  1 spray Each Nare BID   fluticasone furoate-vilanterol  1 puff Inhalation Daily   insulin aspart  0-15 Units Subcutaneous TID WC   lisinopril  5 mg Oral Daily   loratadine  10 mg Oral Daily   metoprolol tartrate  12.5 mg Oral BID   multivitamin with minerals  1 tablet Oral Daily   mupirocin ointment  1 application. Nasal BID   rosuvastatin  40 mg Oral Daily   sodium chloride flush  3 mL Intravenous Q12H   tamsulosin  0.4 mg Oral QPC supper   Continuous Infusions:  sodium chloride     heparin 1,700 Units/hr (01/18/22 0411)   PRN Meds: sodium chloride, acetaminophen, albuterol, nitroGLYCERIN, ondansetron (ZOFRAN) IV, sodium chloride flush   Vital Signs    Vitals:   01/18/22 0400 01/18/22 0500 01/18/22 0600 01/18/22 0747  BP: (!) 94/59  114/73   Pulse: 61 71 86   Resp: 17 (!) 21 16   Temp: 98.3 F (36.8 C)   98.4 F (36.9 C)  TempSrc: Oral   Oral  SpO2: 91% 92% 93%   Weight:      Height:        Intake/Output Summary (Last 24 hours) at 01/18/2022 0838 Last data filed at 01/18/2022 0800 Gross per 24 hour  Intake 1544.45 ml  Output 1000 ml  Net 544.45 ml   Last 3 Weights 01/17/2022 01/17/2022 01/17/2022  Weight (lbs) 218 lb 4.1 oz 219 lb 14.4 oz 223 lb 8.7 oz  Weight (kg) 99 kg 99.746 kg 101.4 kg      Telemetry    Sinus rhythm, HR in the 70s-80s - Personally Reviewed  ECG    No new tracings - Personally Reviewed  Physical Exam   GEN: No acute distress, sitting comfortably in the bed.  Neck:  No JVD Cardiac: RRR, no murmurs, rubs, or gallops. Right radial cath site nontender, with good hemostasis. Right radial pulse 2+  Respiratory: Clear to auscultation bilaterally. No increased WOB GI: Soft, nontender, non-distended  MS: No edema; No deformity. Neuro:  Nonfocal  Psych: Normal affect   Labs    High Sensitivity Troponin:   Recent Labs  Lab 01/16/22 1818 01/16/22 2035  TROPONINIHS 29* 77*     Chemistry Recent Labs  Lab 01/16/22 1818 01/17/22 0217  NA 137 139  K 4.4 3.8  CL 101 106  CO2 29 24  GLUCOSE 461* 209*  BUN 21 15  CREATININE 1.03 0.77  CALCIUM 9.1 8.9  PROT 7.2  --   ALBUMIN 4.0  --   AST 20  --   ALT 28  --   ALKPHOS 83  --   BILITOT 0.3  --   GFRNONAA >60 >60  ANIONGAP 7 9    Lipids  Recent Labs  Lab 01/17/22 0217  CHOL 199  TRIG 63  HDL 47  LDLCALC 139*  CHOLHDL 4.2    Hematology Recent Labs  Lab 01/16/22 1818 01/17/22 0217 01/18/22  0115  WBC 7.3 8.9 8.5  RBC 5.02 4.70 4.44  HGB 14.5 13.4 12.9*  HCT 45.0 40.8 39.2  MCV 89.6 86.8 88.3  MCH 28.9 28.5 29.1  MCHC 32.2 32.8 32.9  RDW 13.0 13.0 12.9  PLT 262 253 242   Thyroid  Recent Labs  Lab 01/17/22 0217  TSH 1.169    BNPNo results for input(s): BNP, PROBNP in the last 168 hours.  DDimer No results for input(s): DDIMER in the last 168 hours.   Radiology    DG Chest 2 View  Result Date: 01/16/2022 CLINICAL DATA:  Chest pain EXAM: CHEST - 2 VIEW COMPARISON:  12/10/2017 FINDINGS: The heart size and mediastinal contours are within normal limits. Both lungs are clear. The visualized skeletal structures are unremarkable. IMPRESSION: No active cardiopulmonary disease. Electronically Signed   By: Kathreen Devoid M.D.   On: 01/16/2022 17:45   CARDIAC CATHETERIZATION  Result Date: 01/17/2022 Conclusions: Critical distal LMCA stenosis with 90% eccentric narrowing and TIMI-1 flow in the distal LAD (also supplied by right-to-left collaterals). Moderate-severe LCx and RCA disease with  60% mid/distal LCx and 70% proximal RCA lesions. Normal left ventricular systolic function (LVEF 29-52%) with mildly elevated filling pressure (LVEDP 20-25 mmHg). Recommendations: Cardiac surgery consultation for CABG; transfer to [place] for close monitoring pending surgical evaluation. Restart heparin infusion 2 hours after TR band removal. Aggressive secondary prevention. Nelva Bush, MD [facility]  VAS US DOPPLER PRE CABG  Result Date: 01/17/2022 PREOPERATIVE VASCULAR EVALUATION Patient Name:  Jacob Acosta [facility]  Date of Exam:   01/17/2022  Summary: Right Carotid: The extracranial vessels were near-normal with only minimal wall                thickening or plaque. Left Carotid: The extracranial vessels were near-normal with only minimal wall               thickening or plaque. Vertebrals:  Bilateral vertebral arteries demonstrate antegrade flow. Subclavians: Normal flow hemodynamics were seen in bilateral subclavian              arteries. Right Upper Extremity: No significant arterial obstruction detected in the right upper extremity. Left Upper Extremity: No significant arterial obstruction detected in the left upper extremity. Doppler waveforms decrease >50% with left radial compression. Doppler waveforms remain within normal limits with left ulnar compression.  Pedal arteries are within normal limits at rest, bilaterally. Electronically signed by Servando Snare MD on 01/17/2022 at 5:11:46 PM.    Final     Cardiac Studies   Left Heart Cath 01/17/22 Conclusions: Critical distal LMCA stenosis with 90% eccentric narrowing and TIMI-1 flow in the distal LAD (also supplied by right-to-left collaterals). Moderate-severe LCx and RCA disease with 60% mid/distal LCx and 70% proximal RCA lesions. Normal left ventricular systolic function (LVEF 84-13%) with mildly elevated filling pressure (LVEDP 20-25 mmHg).   Recommendations: Cardiac surgery consultation for CABG; transfer to [place] for close monitoring pending  surgical evaluation. Restart heparin infusion 2 hours after TR band removal. Aggressive secondary prevention.    Patient Profile     61 y.o. male  with DM2, HLD, obesity, and nephrolithiasis who is being seen 01/17/2022 for the evaluation of ACS/NSTEMI.  Assessment & Plan    NSTEMI : hsTn 26>>77, initial EKG without ischemia - Patient underwent LHC on 01/17/22 that showed distal LMCA stenosis with 90% eccentric narrowing, moderate-severe LCx and RCA disease with 60% mid/distal LCx and 70% proximal RCA lesions - Cardiothoracic surgery consulted, planned for CABG  on 01/19/22  - Echocardiogram pending today  - Patient on IV heparin, dosed per pharmacy in anticipation of CABG  - Continue daily ASA, metoprolol 12.5 mg BID, crestor 40 mg  Type 2 DM  - Continue SSI while inpatient  We will start low-dose long-acting nighttime insulin baseline and obtain diabetes care management consultation to assist with outpatient regimen.  HLD  - LDL (calc) 139, HDL 47, triglycerides 63 this admission - Continue crestor 40 mg daily (increased from home dose of 20 mg daily)  - Discussed diet modifications  - Patient may benefit from lipid clinic referral    For questions or updates, please contact [place] Please consult [email or web address] for contact info under        Signed, Margie Billet, PA-C  01/18/2022, 8:38 AM     ATTENDING ATTESTATION  I have seen, examined and evaluated the patient this morning along with Vikki Ports, PA.  After reviewing all the available data and chart, we discussed the patients laboratory, study & physical findings as well as symptoms in detail. I agree with her findings, examination as well as impression recommendations as per our discussion.    Doing well post cath.  No further angina symptoms.  Has met with cardiac surgery.  Waiting to hear timing of planned CABG. With left main disease, he is on aspirin and IV heparin.  Not having active angina, therefore  not on IV NTG.  On high-dose Crestor.  Tolerating low-dose metoprolol.  Blood pressure will not tolerate much more titration..  We will monitor in ICU pending CABG. With diabetes, need to place him on   Glenetta Hew, M.D., M.S. Interventional Cardiologist   Pager # [phone] Phone # [phone] [address] [place], [place] [address]

## 2022-01-18 NOTE — Progress Notes (Signed)
ANTICOAGULATION CONSULT NOTE ? ?Pharmacy Consult for Heparin ?Indication: chest pain/ACS ? ?Allergies  ?Allergen Reactions  ? Ciprofloxacin Hcl Other (See Comments)  ?  Hallucinations ?  ? Codeine Anxiety and Other (See Comments)  ?  "Causes him to feel very anxious"  ? Metformin And Related Nausea Only and Palpitations  ? Penicillins Rash  ?  Has patient had a PCN reaction causing immediate rash, facial/tongue/throat swelling, SOB or lightheadedness with hypotension: Yes ?Has patient had a PCN reaction causing severe rash involving mucus membranes or skin necrosis: No ?Has patient had a PCN reaction that required hospitalization: No ?Has patient had a PCN reaction occurring within the last 10 years: Yes ?If all of the above answers are "NO", then may proceed with Cephalosporin use. ?  ? Toujeo Max [employer] [Insulin Glargine] Itching, Nausea Only, Palpitations and Other (See Comments)  ?  Itching throat  ? ? ?Patient Measurements: ?Height: 5\' 6"  (167.6 cm) ?Weight: 99 kg (218 lb 4.1 oz) ?IBW/kg (Calculated) : 63.8 ?HEPARIN DW (KG): 85.5  ? ?Vital Signs: ?Temp: 98.4 ?F (36.9 ?C) (03/06 2300) ?Temp Source: Oral (03/06 2300) ?BP: 92/57 (03/07 0300) ?Pulse Rate: 66 (03/07 0300) ? ?Labs: ?Recent Labs  ?  01/16/22 ?1818 01/16/22 ?2035 01/17/22 ?0217 01/17/22 ?[id number] 01/18/22 ?0115  ?HGB 14.5  --  13.4  --  12.9*  ?HCT 45.0  --  40.8  --  39.2  ?PLT 262  --  253  --  242  ?HEPARINUNFRC  --   --  0.10* 0.35 0.15*  ?CREATININE 1.03  --  0.77  --   --   ?TROPONINIHS 29* 77*  --   --   --   ? ? ? ?Estimated Creatinine Clearance: 105.5 mL/min (by C-G formula based on SCr of 0.77 mg/dL). ? ? ?Assessment: ?Patient presented with chest pain and now s/p cath with recommendation for CABG evaluation.  Pharmacy consulted to restart IV heparin 2 hrs post TR band removal.  TR band removed around 1500 per RN; no bleeding or hematoma. ? ?Heparin level this morning is below goal at 0.15 on 1400 units/hr this morning. No bleeding or IV issues  noted per nursing.  ? ?Goal of Therapy:  ?Heparin level 0.3-0.7 units/ml ?Monitor platelets by anticoagulation protocol: Yes ?  ?Plan:  ?Increase heparin to 1700 units/hr ?Recheck heparin level in 6-8 hours ? ?Erin Hearing PharmD., BCPS ?Clinical Pharmacist ?01/18/2022 4:00 AM ? ?

## 2022-01-18 NOTE — Progress Notes (Signed)
?  Echocardiogram ?2D Echocardiogram has been performed. ? ?Jacob Acosta ?01/18/2022, 10:23 AM ?

## 2022-01-19 ENCOUNTER — Encounter (HOSPITAL_COMMUNITY): Payer: Self-pay | Admitting: Student in an Organized Health Care Education/Training Program

## 2022-01-19 ENCOUNTER — Telehealth: Payer: Self-pay | Admitting: Cardiovascular Disease

## 2022-01-19 ENCOUNTER — Inpatient Hospital Stay (HOSPITAL_COMMUNITY): Payer: 59

## 2022-01-19 ENCOUNTER — Inpatient Hospital Stay (HOSPITAL_COMMUNITY): Payer: 59 | Admitting: Certified Registered"

## 2022-01-19 ENCOUNTER — Inpatient Hospital Stay (HOSPITAL_COMMUNITY)
Admission: EM | Disposition: A | Discharge status: Home / Self Care | Payer: Self-pay | Source: Home / Self Care | Attending: Thoracic Surgery (Cardiothoracic Vascular Surgery)

## 2022-01-19 ENCOUNTER — Other Ambulatory Visit: Payer: Self-pay

## 2022-01-19 DIAGNOSIS — I2510 Atherosclerotic heart disease of native coronary artery without angina pectoris: Secondary | ICD-10-CM | POA: Diagnosis present

## 2022-01-19 HISTORY — PX: CORONARY ARTERY BYPASS GRAFT: SHX141

## 2022-01-19 HISTORY — PX: RADIAL ARTERY HARVEST: SHX5067

## 2022-01-19 HISTORY — PX: TEE WITHOUT CARDIOVERSION: SHX5443

## 2022-01-19 LAB — BASIC METABOLIC PANEL
Anion gap: 10 (ref 5–15)
Anion gap: 5 (ref 5–15)
BUN: 12 mg/dL (ref 8–23)
BUN: 11 mg/dL (ref 8–23)
CO2: 24 mmol/L (ref 22–32)
CO2: 22 mmol/L (ref 22–32)
Calcium: 9.3 mg/dL (ref 8.9–10.3)
Calcium: 8.2 mg/dL — ABNORMAL LOW (ref 8.9–10.3)
Chloride: 104 mmol/L (ref 98–111)
Chloride: 111 mmol/L (ref 98–111)
Creatinine, Ser: 0.98 mg/dL (ref 0.61–1.24)
Creatinine, Ser: 0.89 mg/dL (ref 0.61–1.24)
GFR, Estimated: >60 mL/min (ref >60)
GFR, Estimated: >60 mL/min (ref >60)
Glucose, Bld: 248 mg/dL — ABNORMAL HIGH (ref 70–99)
Glucose, Bld: 152 mg/dL — ABNORMAL HIGH (ref 70–99)
Potassium: 4 mmol/L (ref 3.5–5.1)
Potassium: 4.5 mmol/L (ref 3.5–5.1)
Sodium: 138 mmol/L (ref 135–145)
Sodium: 138 mmol/L (ref 135–145)

## 2022-01-19 LAB — POCT I-STAT 7, (LYTES, BLD GAS, ICA,H+H)
Acid-Base Excess: 4 mmol/L — ABNORMAL HIGH (ref 0.0–2.0)
Acid-Base Excess: 0 mmol/L (ref 0.0–2.0)
Acid-Base Excess: 1 mmol/L (ref 0.0–2.0)
Acid-base deficit: 1 mmol/L (ref 0.0–2.0)
Acid-base deficit: 2 mmol/L (ref 0.0–2.0)
Acid-base deficit: 5 mmol/L — ABNORMAL HIGH (ref 0.0–2.0)
Acid-base deficit: 6 mmol/L — ABNORMAL HIGH (ref 0.0–2.0)
Bicarbonate: 28.8 mmol/L — ABNORMAL HIGH (ref 20.0–28.0)
Bicarbonate: 23.6 mmol/L (ref 20.0–28.0)
Bicarbonate: 24.5 mmol/L (ref 20.0–28.0)
Bicarbonate: 25.4 mmol/L (ref 20.0–28.0)
Bicarbonate: 23.4 mmol/L (ref 20.0–28.0)
Bicarbonate: 19.4 mmol/L — ABNORMAL LOW (ref 20.0–28.0)
Bicarbonate: 20.5 mmol/L (ref 20.0–28.0)
Calcium, Ion: 1.26 mmol/L (ref 1.15–1.40)
Calcium, Ion: 1.05 mmol/L — ABNORMAL LOW (ref 1.15–1.40)
Calcium, Ion: 1.1 mmol/L — ABNORMAL LOW (ref 1.15–1.40)
Calcium, Ion: 1.11 mmol/L — ABNORMAL LOW (ref 1.15–1.40)
Calcium, Ion: 1.33 mmol/L (ref 1.15–1.40)
Calcium, Ion: 1.23 mmol/L (ref 1.15–1.40)
Calcium, Ion: 1.24 mmol/L (ref 1.15–1.40)
HCT: 36 % — ABNORMAL LOW (ref 39.0–52.0)
HCT: 26 % — ABNORMAL LOW (ref 39.0–52.0)
HCT: 27 % — ABNORMAL LOW (ref 39.0–52.0)
HCT: 26 % — ABNORMAL LOW (ref 39.0–52.0)
HCT: 33 % — ABNORMAL LOW (ref 39.0–52.0)
HCT: 33 % — ABNORMAL LOW (ref 39.0–52.0)
HCT: 31 % — ABNORMAL LOW (ref 39.0–52.0)
Hemoglobin: 12.2 g/dL — ABNORMAL LOW (ref 13.0–17.0)
Hemoglobin: 8.8 g/dL — ABNORMAL LOW (ref 13.0–17.0)
Hemoglobin: 9.2 g/dL — ABNORMAL LOW (ref 13.0–17.0)
Hemoglobin: 8.8 g/dL — ABNORMAL LOW (ref 13.0–17.0)
Hemoglobin: 11.2 g/dL — ABNORMAL LOW (ref 13.0–17.0)
Hemoglobin: 11.2 g/dL — ABNORMAL LOW (ref 13.0–17.0)
Hemoglobin: 10.5 g/dL — ABNORMAL LOW (ref 13.0–17.0)
O2 Saturation: 100 %
O2 Saturation: 100 %
O2 Saturation: 100 %
O2 Saturation: 100 %
O2 Saturation: 97 %
O2 Saturation: 99 %
O2 Saturation: 99 %
Patient temperature: 36.7
Patient temperature: 37.6
Patient temperature: 38
Potassium: 4 mmol/L (ref 3.5–5.1)
Potassium: 3.7 mmol/L (ref 3.5–5.1)
Potassium: 3.8 mmol/L (ref 3.5–5.1)
Potassium: 4 mmol/L (ref 3.5–5.1)
Potassium: 3.5 mmol/L (ref 3.5–5.1)
Potassium: 4.4 mmol/L (ref 3.5–5.1)
Potassium: 4.3 mmol/L (ref 3.5–5.1)
Sodium: 140 mmol/L (ref 135–145)
Sodium: 139 mmol/L (ref 135–145)
Sodium: 141 mmol/L (ref 135–145)
Sodium: 140 mmol/L (ref 135–145)
Sodium: 141 mmol/L (ref 135–145)
Sodium: 140 mmol/L (ref 135–145)
Sodium: 142 mmol/L (ref 135–145)
TCO2: 30 mmol/L (ref 22–32)
TCO2: 25 mmol/L (ref 22–32)
TCO2: 26 mmol/L (ref 22–32)
TCO2: 27 mmol/L (ref 22–32)
TCO2: 25 mmol/L (ref 22–32)
TCO2: 20 mmol/L — ABNORMAL LOW (ref 22–32)
TCO2: 22 mmol/L (ref 22–32)
pCO2 arterial: 44.9 mmHg (ref 32–48)
pCO2 arterial: 37.4 mmHg (ref 32–48)
pCO2 arterial: 37.5 mmHg (ref 32–48)
pCO2 arterial: 39.8 mmHg (ref 32–48)
pCO2 arterial: 41.7 mmHg (ref 32–48)
pCO2 arterial: 34.2 mmHg (ref 32–48)
pCO2 arterial: 44.6 mmHg (ref 32–48)
pH, Arterial: 7.416 (ref 7.35–7.45)
pH, Arterial: 7.408 (ref 7.35–7.45)
pH, Arterial: 7.424 (ref 7.35–7.45)
pH, Arterial: 7.412 (ref 7.35–7.45)
pH, Arterial: 7.357 (ref 7.35–7.45)
pH, Arterial: 7.364 (ref 7.35–7.45)
pH, Arterial: 7.275 — ABNORMAL LOW (ref 7.35–7.45)
pO2, Arterial: 407 mmHg — ABNORMAL HIGH (ref 83–108)
pO2, Arterial: 269 mmHg — ABNORMAL HIGH (ref 83–108)
pO2, Arterial: 361 mmHg — ABNORMAL HIGH (ref 83–108)
pO2, Arterial: 330 mmHg — ABNORMAL HIGH (ref 83–108)
pO2, Arterial: 92 mmHg (ref 83–108)
pO2, Arterial: 128 mmHg — ABNORMAL HIGH (ref 83–108)
pO2, Arterial: 137 mmHg — ABNORMAL HIGH (ref 83–108)

## 2022-01-19 LAB — POCT I-STAT, CHEM 8
BUN: 13 mg/dL (ref 8–23)
BUN: 12 mg/dL (ref 8–23)
BUN: 12 mg/dL (ref 8–23)
BUN: 11 mg/dL (ref 8–23)
Calcium, Ion: 1.25 mmol/L (ref 1.15–1.40)
Calcium, Ion: 1.21 mmol/L (ref 1.15–1.40)
Calcium, Ion: 1.09 mmol/L — ABNORMAL LOW (ref 1.15–1.40)
Calcium, Ion: 1.36 mmol/L (ref 1.15–1.40)
Chloride: 104 mmol/L (ref 98–111)
Chloride: 106 mmol/L (ref 98–111)
Chloride: 103 mmol/L (ref 98–111)
Chloride: 105 mmol/L (ref 98–111)
Creatinine, Ser: 0.7 mg/dL (ref 0.61–1.24)
Creatinine, Ser: 0.7 mg/dL (ref 0.61–1.24)
Creatinine, Ser: 0.6 mg/dL — ABNORMAL LOW (ref 0.61–1.24)
Creatinine, Ser: 0.6 mg/dL — ABNORMAL LOW (ref 0.61–1.24)
Glucose, Bld: 129 mg/dL — ABNORMAL HIGH (ref 70–99)
Glucose, Bld: 162 mg/dL — ABNORMAL HIGH (ref 70–99)
Glucose, Bld: 162 mg/dL — ABNORMAL HIGH (ref 70–99)
Glucose, Bld: 171 mg/dL — ABNORMAL HIGH (ref 70–99)
HCT: 39 % (ref 39.0–52.0)
HCT: 32 % — ABNORMAL LOW (ref 39.0–52.0)
HCT: 26 % — ABNORMAL LOW (ref 39.0–52.0)
HCT: 27 % — ABNORMAL LOW (ref 39.0–52.0)
Hemoglobin: 13.3 g/dL (ref 13.0–17.0)
Hemoglobin: 10.9 g/dL — ABNORMAL LOW (ref 13.0–17.0)
Hemoglobin: 8.8 g/dL — ABNORMAL LOW (ref 13.0–17.0)
Hemoglobin: 9.2 g/dL — ABNORMAL LOW (ref 13.0–17.0)
Potassium: 4.1 mmol/L (ref 3.5–5.1)
Potassium: 3.6 mmol/L (ref 3.5–5.1)
Potassium: 3.9 mmol/L (ref 3.5–5.1)
Potassium: 3.7 mmol/L (ref 3.5–5.1)
Sodium: 140 mmol/L (ref 135–145)
Sodium: 139 mmol/L (ref 135–145)
Sodium: 138 mmol/L (ref 135–145)
Sodium: 139 mmol/L (ref 135–145)
TCO2: 28 mmol/L (ref 22–32)
TCO2: 26 mmol/L (ref 22–32)
TCO2: 25 mmol/L (ref 22–32)
TCO2: 26 mmol/L (ref 22–32)

## 2022-01-19 LAB — CBC
HCT: 41.9 % (ref 39.0–52.0)
HCT: 32.9 % — ABNORMAL LOW (ref 39.0–52.0)
HCT: 35.7 % — ABNORMAL LOW (ref 39.0–52.0)
Hemoglobin: 14 g/dL (ref 13.0–17.0)
Hemoglobin: 11.1 g/dL — ABNORMAL LOW (ref 13.0–17.0)
Hemoglobin: 12 g/dL — ABNORMAL LOW (ref 13.0–17.0)
MCH: 28.7 pg (ref 26.0–34.0)
MCH: 29.3 pg (ref 26.0–34.0)
MCH: 29.3 pg (ref 26.0–34.0)
MCHC: 33.4 g/dL (ref 30.0–36.0)
MCHC: 33.7 g/dL (ref 30.0–36.0)
MCHC: 33.6 g/dL (ref 30.0–36.0)
MCV: 85.9 fL (ref 80.0–100.0)
MCV: 86.8 fL (ref 80.0–100.0)
MCV: 87.3 fL (ref 80.0–100.0)
Platelets: 241 10*3/uL (ref 150–400)
Platelets: 157 K/uL (ref 150–400)
Platelets: 186 10*3/uL (ref 150–400)
RBC: 4.88 MIL/uL (ref 4.22–5.81)
RBC: 3.79 MIL/uL — ABNORMAL LOW (ref 4.22–5.81)
RBC: 4.09 MIL/uL — ABNORMAL LOW (ref 4.22–5.81)
RDW: 12.8 % (ref 11.5–15.5)
RDW: 12.4 % (ref 11.5–15.5)
RDW: 12.7 % (ref 11.5–15.5)
WBC: 7.5 10*3/uL (ref 4.0–10.5)
WBC: 10.5 K/uL (ref 4.0–10.5)
WBC: 10.5 10*3/uL (ref 4.0–10.5)
nRBC: 0 % (ref 0.0–0.2)
nRBC: 0 % (ref 0.0–0.2)
nRBC: 0 % (ref 0.0–0.2)

## 2022-01-19 LAB — POCT I-STAT EG7
Acid-Base Excess: 0 mmol/L (ref 0.0–2.0)
Bicarbonate: 24.9 mmol/L (ref 20.0–28.0)
Calcium, Ion: 1.11 mmol/L — ABNORMAL LOW (ref 1.15–1.40)
HCT: 28 % — ABNORMAL LOW (ref 39.0–52.0)
Hemoglobin: 9.5 g/dL — ABNORMAL LOW (ref 13.0–17.0)
O2 Saturation: 75 %
Potassium: 3.5 mmol/L (ref 3.5–5.1)
Sodium: 143 mmol/L (ref 135–145)
TCO2: 26 mmol/L (ref 22–32)
pCO2, Ven: 39.6 mmHg — ABNORMAL LOW (ref 44–60)
pH, Ven: 7.406 (ref 7.25–7.43)
pO2, Ven: 40 mmHg (ref 32–45)

## 2022-01-19 LAB — GLUCOSE, CAPILLARY
Glucose-Capillary: 109 mg/dL — ABNORMAL HIGH (ref 70–99)
Glucose-Capillary: 116 mg/dL — ABNORMAL HIGH (ref 70–99)
Glucose-Capillary: 130 mg/dL — ABNORMAL HIGH (ref 70–99)
Glucose-Capillary: 137 mg/dL — ABNORMAL HIGH (ref 70–99)
Glucose-Capillary: 139 mg/dL — ABNORMAL HIGH (ref 70–99)
Glucose-Capillary: 142 mg/dL — ABNORMAL HIGH (ref 70–99)
Glucose-Capillary: 174 mg/dL — ABNORMAL HIGH (ref 70–99)
Glucose-Capillary: 181 mg/dL — ABNORMAL HIGH (ref 70–99)
Glucose-Capillary: 183 mg/dL — ABNORMAL HIGH (ref 70–99)
Glucose-Capillary: 259 mg/dL — ABNORMAL HIGH (ref 70–99)

## 2022-01-19 LAB — PROTIME-INR
INR: 1.3 — ABNORMAL HIGH (ref 0.8–1.2)
Prothrombin Time: 16.3 s — ABNORMAL HIGH (ref 11.4–15.2)

## 2022-01-19 LAB — HEMOGLOBIN AND HEMATOCRIT, BLOOD
HCT: 28 % — ABNORMAL LOW (ref 39.0–52.0)
Hemoglobin: 9.6 g/dL — ABNORMAL LOW (ref 13.0–17.0)

## 2022-01-19 LAB — HEPARIN LEVEL (UNFRACTIONATED): Heparin Unfractionated: 0.63 IU/mL (ref 0.30–0.70)

## 2022-01-19 LAB — APTT: aPTT: 25 seconds (ref 24–36)

## 2022-01-19 LAB — MAGNESIUM: Magnesium: 2.7 mg/dL — ABNORMAL HIGH (ref 1.7–2.4)

## 2022-01-19 LAB — PLATELET COUNT: Platelets: 196 10*3/uL (ref 150–400)

## 2022-01-19 SURGERY — CORONARY ARTERY BYPASS GRAFTING (CABG)
Anesthesia: General | Site: Chest

## 2022-01-19 MED ORDER — SODIUM CHLORIDE 0.9% FLUSH
3.0000 mL | Freq: Two times a day (BID) | INTRAVENOUS | Status: DC
  Administered 2022-01-20 – 2022-01-21 (×3): 3 mL via INTRAVENOUS

## 2022-01-19 MED ORDER — BISACODYL 10 MG RE SUPP: 10.0000 mg | Freq: Every day | RECTAL | Status: DC

## 2022-01-19 MED ORDER — MIDAZOLAM HCL 5 MG/5ML IJ SOLN
INTRAMUSCULAR | Status: DC | PRN
  Administered 2022-01-19: 3 mg via INTRAVENOUS
  Administered 2022-01-19: 2 mg via INTRAVENOUS
  Administered 2022-01-19: 3 mg via INTRAVENOUS
  Administered 2022-01-19: 2 mg via INTRAVENOUS

## 2022-01-19 MED ORDER — MIDAZOLAM HCL (PF) 10 MG/2ML IJ SOLN
INTRAMUSCULAR | Status: AC
  Filled 2022-01-19: qty 2

## 2022-01-19 MED ORDER — MIDAZOLAM HCL 2 MG/2ML IJ SOLN: 2.0000 mg | INTRAMUSCULAR | Status: DC | PRN

## 2022-01-19 MED ORDER — PROPOFOL 10 MG/ML IV BOLUS
INTRAVENOUS | Status: AC
  Filled 2022-01-19: qty 20

## 2022-01-19 MED ORDER — VASOPRESSIN 20 UNITS/100 ML INFUSION FOR SHOCK
INTRAVENOUS | Status: DC | PRN
Start: 2022-01-19 — End: 2022-01-19
  Administered 2022-01-19: .03 [IU]/min via INTRAVENOUS

## 2022-01-19 MED ORDER — POTASSIUM CHLORIDE 10 MEQ/50ML IV SOLN
10.0000 meq | INTRAVENOUS | Status: AC
  Administered 2022-01-19 (×2): 10 meq via INTRAVENOUS

## 2022-01-19 MED ORDER — ONDANSETRON HCL 4 MG/2ML IJ SOLN
4.0000 mg | Freq: Four times a day (QID) | INTRAMUSCULAR | Status: DC | PRN
  Administered 2022-01-20 – 2022-01-21 (×2): 4 mg via INTRAVENOUS
  Filled 2022-01-19 (×2): qty 2

## 2022-01-19 MED ORDER — FENTANYL CITRATE (PF) 250 MCG/5ML IJ SOLN
INTRAMUSCULAR | Status: AC
  Filled 2022-01-19: qty 5

## 2022-01-19 MED ORDER — LIDOCAINE 2% (20 MG/ML) 5 ML SYRINGE
INTRAMUSCULAR | Status: DC | PRN
  Administered 2022-01-19: 40 mg via INTRAVENOUS

## 2022-01-19 MED ORDER — MORPHINE SULFATE (PF) 2 MG/ML IV SOLN
1.0000 mg | INTRAVENOUS | Status: DC | PRN
  Administered 2022-01-19 – 2022-01-20 (×5): 4 mg via INTRAVENOUS
  Filled 2022-01-19 (×5): qty 2

## 2022-01-19 MED ORDER — HEPARIN SODIUM (PORCINE) 1000 UNIT/ML IJ SOLN
INTRAMUSCULAR | Status: DC | PRN
  Administered 2022-01-19: 30000 [IU] via INTRAVENOUS

## 2022-01-19 MED ORDER — LACTATED RINGERS IV SOLN
INTRAVENOUS | Status: DC | PRN
Start: 2022-01-19 — End: 2022-01-19

## 2022-01-19 MED ORDER — GLYCOPYRROLATE PF 0.2 MG/ML IJ SOSY
PREFILLED_SYRINGE | INTRAMUSCULAR | Status: DC | PRN
  Administered 2022-01-19: .2 mg via INTRAVENOUS

## 2022-01-19 MED ORDER — BISACODYL 5 MG PO TBEC
10.0000 mg | DELAYED_RELEASE_TABLET | Freq: Every day | ORAL | Status: DC
  Administered 2022-01-20 – 2022-01-23 (×4): 10 mg via ORAL
  Filled 2022-01-19 (×4): qty 2

## 2022-01-19 MED ORDER — EPHEDRINE SULFATE (PRESSORS) 50 MG/ML IJ SOLN
INTRAMUSCULAR | Status: DC | PRN
  Administered 2022-01-19: 10 mg via INTRAVENOUS

## 2022-01-19 MED ORDER — LACTATED RINGERS IV SOLN: INTRAVENOUS | Status: DC | PRN

## 2022-01-19 MED ORDER — MIDAZOLAM HCL 2 MG/2ML IJ SOLN: 2.0000 mg | Freq: Once | INTRAMUSCULAR | Status: AC

## 2022-01-19 MED ORDER — SODIUM CHLORIDE 0.9 % IV SOLN: INTRAVENOUS | Status: DC

## 2022-01-19 MED ORDER — CHLORHEXIDINE GLUCONATE CLOTH 2 % EX PADS
6.0000 | MEDICATED_PAD | Freq: Every day | CUTANEOUS | Status: DC
  Administered 2022-01-19 – 2022-01-23 (×4): 6 via TOPICAL

## 2022-01-19 MED ORDER — CEFAZOLIN SODIUM-DEXTROSE 2-4 GM/100ML-% IV SOLN
2.0000 g | Freq: Three times a day (TID) | INTRAVENOUS | Status: AC
  Administered 2022-01-19 – 2022-01-21 (×6): 2 g via INTRAVENOUS
  Filled 2022-01-19 (×5): qty 100

## 2022-01-19 MED ORDER — PAPAVERINE HCL 30 MG/ML IJ SOLN: INTRAMUSCULAR | Status: DC | PRN

## 2022-01-19 MED ORDER — MAGNESIUM SULFATE 4 GM/100ML IV SOLN
4.0000 g | Freq: Once | INTRAVENOUS | Status: AC
  Administered 2022-01-19: 4 g via INTRAVENOUS
  Filled 2022-01-19: qty 100

## 2022-01-19 MED ORDER — PROTAMINE SULFATE 10 MG/ML IV SOLN
INTRAVENOUS | Status: DC | PRN
  Administered 2022-01-19: 290 mg via INTRAVENOUS
  Administered 2022-01-19: 10 mg via INTRAVENOUS

## 2022-01-19 MED ORDER — SODIUM CHLORIDE 0.9 % IV SOLN: 250.0000 mL | INTRAVENOUS | Status: DC

## 2022-01-19 MED ORDER — FENTANYL CITRATE (PF) 100 MCG/2ML IJ SOLN
INTRAMUSCULAR | Status: AC
  Administered 2022-01-19: 25 ug via INTRAVENOUS
  Filled 2022-01-19: qty 2

## 2022-01-19 MED ORDER — LACTATED RINGERS IV SOLN: 500.0000 mL | Freq: Once | INTRAVENOUS | Status: DC | PRN

## 2022-01-19 MED ORDER — SODIUM CHLORIDE 0.45 % IV SOLN: INTRAVENOUS | Status: DC | PRN

## 2022-01-19 MED ORDER — INSULIN REGULAR(HUMAN) IN NACL 100-0.9 UT/100ML-% IV SOLN
INTRAVENOUS | Status: DC
  Administered 2022-01-19: 1.7 [IU]/h via INTRAVENOUS

## 2022-01-19 MED ORDER — NOREPINEPHRINE 4 MG/250ML-% IV SOLN
0.0000 ug/min | INTRAVENOUS | Status: DC
  Administered 2022-01-20: 07:00:00 5 ug/min via INTRAVENOUS
  Filled 2022-01-19: qty 250

## 2022-01-19 MED ORDER — LACTATED RINGERS IV SOLN: INTRAVENOUS | Status: DC

## 2022-01-19 MED ORDER — FENTANYL CITRATE (PF) 250 MCG/5ML IJ SOLN
INTRAMUSCULAR | Status: DC | PRN
  Administered 2022-01-19: 250 ug via INTRAVENOUS
  Administered 2022-01-19: 150 ug via INTRAVENOUS
  Administered 2022-01-19: 250 ug via INTRAVENOUS
  Administered 2022-01-19: 100 ug via INTRAVENOUS
  Administered 2022-01-19 (×2): 250 ug via INTRAVENOUS

## 2022-01-19 MED ORDER — ALBUMIN HUMAN 5 % IV SOLN
250.0000 mL | INTRAVENOUS | Status: AC | PRN
  Administered 2022-01-19 (×3): 12.5 g via INTRAVENOUS

## 2022-01-19 MED ORDER — PROPOFOL 10 MG/ML IV BOLUS
INTRAVENOUS | Status: DC | PRN
Start: 2022-01-19 — End: 2022-01-19
  Administered 2022-01-19: 100 mg via INTRAVENOUS

## 2022-01-19 MED ORDER — VASOPRESSIN 20 UNIT/ML IV SOLN
INTRAVENOUS | Status: DC | PRN
  Administered 2022-01-19 (×4): 1 [IU] via INTRAVENOUS

## 2022-01-19 MED ORDER — VANCOMYCIN HCL IN DEXTROSE 1-5 GM/200ML-% IV SOLN
1000.0000 mg | Freq: Once | INTRAVENOUS | Status: AC
  Administered 2022-01-19: 1000 mg via INTRAVENOUS
  Filled 2022-01-19: qty 200

## 2022-01-19 MED ORDER — SUCCINYLCHOLINE CHLORIDE 200 MG/10ML IV SOSY
PREFILLED_SYRINGE | INTRAVENOUS | Status: DC | PRN
  Administered 2022-01-19: 140 mg via INTRAVENOUS

## 2022-01-19 MED ORDER — SODIUM CHLORIDE 0.9% FLUSH: 3.0000 mL | INTRAVENOUS | Status: DC | PRN

## 2022-01-19 MED ORDER — ACETAMINOPHEN 500 MG PO TABS
1000.0000 mg | ORAL_TABLET | Freq: Four times a day (QID) | ORAL | Status: DC
  Administered 2022-01-19 – 2022-01-21 (×7): 1000 mg via ORAL
  Filled 2022-01-19 (×8): qty 2

## 2022-01-19 MED ORDER — CHLORHEXIDINE GLUCONATE 0.12 % MT SOLN
15.0000 mL | OROMUCOSAL | Status: AC
  Administered 2022-01-19: 15 mL via OROMUCOSAL
  Filled 2022-01-19: qty 15

## 2022-01-19 MED ORDER — ACETAMINOPHEN 650 MG RE SUPP
650.0000 mg | Freq: Once | RECTAL | Status: AC
  Administered 2022-01-19: 650 mg via RECTAL

## 2022-01-19 MED ORDER — DEXMEDETOMIDINE HCL IN NACL 400 MCG/100ML IV SOLN
0.0000 ug/kg/h | INTRAVENOUS | Status: DC
  Administered 2022-01-19: 0.2 ug/kg/h via INTRAVENOUS
  Filled 2022-01-19: qty 100

## 2022-01-19 MED ORDER — DOCUSATE SODIUM 100 MG PO CAPS
200.0000 mg | ORAL_CAPSULE | Freq: Every day | ORAL | Status: DC
  Administered 2022-01-20 – 2022-01-23 (×4): 200 mg via ORAL
  Filled 2022-01-19 (×4): qty 2

## 2022-01-19 MED ORDER — ASPIRIN EC 325 MG PO TBEC
325.0000 mg | DELAYED_RELEASE_TABLET | Freq: Every day | ORAL | Status: DC
  Administered 2022-01-20 – 2022-01-22 (×3): 325 mg via ORAL
  Filled 2022-01-19 (×4): qty 1

## 2022-01-19 MED ORDER — ACETAMINOPHEN 160 MG/5ML PO SOLN: 1000.0000 mg | Freq: Four times a day (QID) | ORAL | Status: DC

## 2022-01-19 MED ORDER — 0.9 % SODIUM CHLORIDE (POUR BTL) OPTIME
TOPICAL | Status: DC | PRN
  Administered 2022-01-19: 5000 mL

## 2022-01-19 MED ORDER — FAMOTIDINE IN NACL 20-0.9 MG/50ML-% IV SOLN
20.0000 mg | Freq: Two times a day (BID) | INTRAVENOUS | Status: DC
  Administered 2022-01-19: 20 mg via INTRAVENOUS
  Filled 2022-01-19: qty 50

## 2022-01-19 MED ORDER — ASPIRIN 81 MG PO CHEW
324.0000 mg | CHEWABLE_TABLET | Freq: Every day | ORAL | Status: DC
  Administered 2022-01-23: 324 mg
  Filled 2022-01-19: qty 4

## 2022-01-19 MED ORDER — PANTOPRAZOLE SODIUM 40 MG PO TBEC
40.0000 mg | DELAYED_RELEASE_TABLET | Freq: Every day | ORAL | Status: DC
  Administered 2022-01-21 – 2022-01-23 (×3): 40 mg via ORAL
  Filled 2022-01-19 (×3): qty 1

## 2022-01-19 MED ORDER — PHENYLEPHRINE 40 MCG/ML (10ML) SYRINGE FOR IV PUSH (FOR BLOOD PRESSURE SUPPORT)
PREFILLED_SYRINGE | INTRAVENOUS | Status: DC | PRN
  Administered 2022-01-19: 160 ug via INTRAVENOUS
  Administered 2022-01-19: 80 ug via INTRAVENOUS

## 2022-01-19 MED ORDER — SODIUM CHLORIDE (PF) 0.9 % IJ SOLN
OROMUCOSAL | Status: DC | PRN
  Administered 2022-01-19: 8 mL via TOPICAL

## 2022-01-19 MED ORDER — PHENYLEPHRINE HCL-NACL 20-0.9 MG/250ML-% IV SOLN: 0.0000 ug/min | INTRAVENOUS | Status: DC

## 2022-01-19 MED ORDER — ACETAMINOPHEN 160 MG/5ML PO SOLN: 650.0000 mg | Freq: Once | ORAL | Status: AC

## 2022-01-19 MED ORDER — DEXTROSE 50 % IV SOLN: 0.0000 mL | INTRAVENOUS | Status: DC | PRN

## 2022-01-19 MED ORDER — METOPROLOL TARTRATE 5 MG/5ML IV SOLN: 2.5000 mg | INTRAVENOUS | Status: DC | PRN

## 2022-01-19 MED ORDER — MIDAZOLAM HCL 2 MG/2ML IJ SOLN
INTRAMUSCULAR | Status: AC
  Administered 2022-01-19: 2 mg via INTRAVENOUS
  Filled 2022-01-19: qty 2

## 2022-01-19 MED ORDER — TRAMADOL HCL 50 MG PO TABS
50.0000 mg | ORAL_TABLET | ORAL | Status: DC | PRN
  Administered 2022-01-20 (×2): 100 mg via ORAL
  Filled 2022-01-19: qty 2
  Filled 2022-01-19: qty 1
  Filled 2022-01-19: qty 2

## 2022-01-19 MED ORDER — NITROGLYCERIN IN D5W 200-5 MCG/ML-% IV SOLN: 7.0000 ug/min | INTRAVENOUS | Status: DC

## 2022-01-19 MED ORDER — METOPROLOL TARTRATE 25 MG/10 ML ORAL SUSPENSION: 12.5000 mg | Freq: Two times a day (BID) | ORAL | Status: DC

## 2022-01-19 MED ORDER — VASOPRESSIN 20 UNITS/100 ML INFUSION FOR SHOCK
0.0000 [IU]/min | INTRAVENOUS | Status: DC
  Administered 2022-01-19 – 2022-01-20 (×2): 0.03 [IU]/min via INTRAVENOUS
  Filled 2022-01-19 (×2): qty 100

## 2022-01-19 MED ORDER — ROCURONIUM BROMIDE 10 MG/ML (PF) SYRINGE
PREFILLED_SYRINGE | INTRAVENOUS | Status: DC | PRN
  Administered 2022-01-19: 30 mg via INTRAVENOUS
  Administered 2022-01-19 (×2): 100 mg via INTRAVENOUS

## 2022-01-19 MED ORDER — METOPROLOL TARTRATE 12.5 MG HALF TABLET
12.5000 mg | ORAL_TABLET | Freq: Two times a day (BID) | ORAL | Status: DC
  Administered 2022-01-20 – 2022-01-21 (×3): 12.5 mg via ORAL
  Filled 2022-01-19 (×4): qty 1

## 2022-01-19 MED ORDER — FENTANYL CITRATE (PF) 100 MCG/2ML IJ SOLN: 25.0000 ug | Freq: Once | INTRAMUSCULAR | Status: AC

## 2022-01-19 SURGICAL SUPPLY — 93 items
ADH SKN CLS APL DERMABOND .7 (GAUZE/BANDAGES/DRESSINGS) ×3
BAG DECANTER FOR FLEXI CONT (MISCELLANEOUS) ×4 IMPLANT
BLADE CLIPPER SURG (BLADE) ×8 IMPLANT
BLADE STERNUM SYSTEM 6 (BLADE) ×4 IMPLANT
BLADE SURG 11 STRL SS (BLADE) ×1 IMPLANT
BLADE SURG 15 STRL LF DISP TIS (BLADE) ×3 IMPLANT
BLADE SURG 15 STRL SS (BLADE) ×4
BNDG ELASTIC 4X5.8 VLCR STR LF (GAUZE/BANDAGES/DRESSINGS) ×8 IMPLANT
BNDG ELASTIC 6X5.8 VLCR STR LF (GAUZE/BANDAGES/DRESSINGS) ×4 IMPLANT
BNDG GAUZE ELAST 4 BULKY (GAUZE/BANDAGES/DRESSINGS) ×5 IMPLANT
CANISTER SUCT 3000ML PPV (MISCELLANEOUS) ×4 IMPLANT
CANNULA MC2 2 STG 29/37 NON-V (CANNULA) ×3 IMPLANT
CANNULA MC2 TWO STAGE (CANNULA) ×4
CANNULA NON VENT 20FR 12 (CANNULA) ×4 IMPLANT
CATH ROBINSON RED A/P 18FR (CATHETERS) ×8 IMPLANT
CLIP FOGARTY SPRING 6M (CLIP) ×1 IMPLANT
CLIP TI MEDIUM 24 (CLIP) ×1 IMPLANT
CLIP TI WIDE RED SMALL 24 (CLIP) ×1 IMPLANT
CONNECTOR BLAKE 2:1 CARIO BLK (MISCELLANEOUS) ×4 IMPLANT
CONTAINER PROTECT SURGISLUSH (MISCELLANEOUS) ×8 IMPLANT
COVER MAYO STAND STRL (DRAPES) ×4 IMPLANT
DERMABOND ADVANCED (GAUZE/BANDAGES/DRESSINGS) ×1
DERMABOND ADVANCED .7 DNX12 (GAUZE/BANDAGES/DRESSINGS) IMPLANT
DRAIN CHANNEL 19F RND (DRAIN) ×11 IMPLANT
DRAPE CARDIOVASCULAR INCISE (DRAPES) ×4
DRAPE EXTREMITY T 121X128X90 (DISPOSABLE) ×4 IMPLANT
DRAPE HALF SHEET 40X57 (DRAPES) ×4 IMPLANT
DRAPE SRG 135X102X78XABS (DRAPES) ×3 IMPLANT
DRAPE WARM FLUID 44X44 (DRAPES) ×4 IMPLANT
DRSG AQUACEL AG ADV 3.5X10 (GAUZE/BANDAGES/DRESSINGS) ×4 IMPLANT
DRSG COVADERM 4X14 (GAUZE/BANDAGES/DRESSINGS) ×4 IMPLANT
ELECT BLADE 4.0 EZ CLEAN MEGAD (MISCELLANEOUS) ×4
ELECT REM PT RETURN 9FT ADLT (ELECTROSURGICAL) ×8
ELECTRODE BLDE 4.0 EZ CLN MEGD (MISCELLANEOUS) ×3 IMPLANT
ELECTRODE REM PT RTRN 9FT ADLT (ELECTROSURGICAL) ×6 IMPLANT
FELT TEFLON 1X6 (MISCELLANEOUS) ×7 IMPLANT
GAUZE 4X4 16PLY ~~LOC~~+RFID DBL (SPONGE) ×7 IMPLANT
GAUZE SPONGE 4X4 12PLY STRL (GAUZE/BANDAGES/DRESSINGS) ×9 IMPLANT
GEL ULTRASOUND 20GR AQUASONIC (MISCELLANEOUS) ×3 IMPLANT
GLOVE SURG ENC MOIS LTX SZ7 (GLOVE) ×8 IMPLANT
GLOVE SURG ENC TEXT LTX SZ7.5 (GLOVE) ×8 IMPLANT
GLOVE SURG UNDER POLY LF SZ6 (GLOVE) ×1 IMPLANT
GOWN STRL REUS W/ TWL LRG LVL3 (GOWN DISPOSABLE) ×12 IMPLANT
GOWN STRL REUS W/ TWL XL LVL3 (GOWN DISPOSABLE) ×6 IMPLANT
GOWN STRL REUS W/TWL LRG LVL3 (GOWN DISPOSABLE) ×24
GOWN STRL REUS W/TWL XL LVL3 (GOWN DISPOSABLE) ×8
HEMOSTAT POWDER SURGIFOAM 1G (HEMOSTASIS) ×11 IMPLANT
INSERT SUTURE HOLDER (MISCELLANEOUS) ×4 IMPLANT
KIT BASIN OR (CUSTOM PROCEDURE TRAY) ×4 IMPLANT
KIT TURNOVER KIT B (KITS) ×8 IMPLANT
KIT VASOVIEW HEMOPRO 2 VH 4000 (KITS) ×4 IMPLANT
LEAD PACING MYOCARDI (MISCELLANEOUS) ×4 IMPLANT
MARKER GRAFT CORONARY BYPASS (MISCELLANEOUS) ×12 IMPLANT
NS IRRIG 1000ML POUR BTL (IV SOLUTION) ×20 IMPLANT
PACK ACCESSORY CANNULA KIT (KITS) ×4 IMPLANT
PACK E OPEN HEART (SUTURE) ×4 IMPLANT
PACK OPEN HEART (CUSTOM PROCEDURE TRAY) ×4 IMPLANT
PAD ARMBOARD 7.5X6 YLW CONV (MISCELLANEOUS) ×14 IMPLANT
PAD ELECT DEFIB RADIOL ZOLL (MISCELLANEOUS) ×4 IMPLANT
PENCIL BUTTON HOLSTER BLD 10FT (ELECTRODE) ×4 IMPLANT
POSITIONER HEAD DONUT 9IN (MISCELLANEOUS) ×4 IMPLANT
PUNCH AORTIC ROTATE 4.0MM (MISCELLANEOUS) ×4 IMPLANT
SET MPS 3-ND DEL (MISCELLANEOUS) ×1 IMPLANT
SHEARS HARMONIC 9CM CVD (BLADE) ×4 IMPLANT
SPONGE T-LAP 18X18 ~~LOC~~+RFID (SPONGE) ×29 IMPLANT
SUPPORT HEART JANKE-BARRON (MISCELLANEOUS) ×4 IMPLANT
SUT BONE WAX W31G (SUTURE) ×4 IMPLANT
SUT ETHIBOND X763 2 0 SH 1 (SUTURE) ×8 IMPLANT
SUT MNCRL AB 3-0 PS2 18 (SUTURE) ×8 IMPLANT
SUT PDS AB 1 CTX 36 (SUTURE) ×8 IMPLANT
SUT PROLENE 3 0 SH DA (SUTURE) ×1 IMPLANT
SUT PROLENE 4 0 SH DA (SUTURE) ×4 IMPLANT
SUT PROLENE 5 0 C 1 36 (SUTURE) ×12 IMPLANT
SUT PROLENE 7 0 BV 1 (SUTURE) ×1 IMPLANT
SUT PROLENE 7 0 BV1 MDA (SUTURE) ×4 IMPLANT
SUT SILK 2 0 TIES 10X30 (SUTURE) ×1 IMPLANT
SUT SILK 4 0 TIE 10X30 (SUTURE) ×1 IMPLANT
SUT STEEL STERNAL CCS#1 18IN (SUTURE) ×3 IMPLANT
SUT VIC AB 1 CTX 36 (SUTURE) ×4
SUT VIC AB 1 CTX36XBRD ANBCTR (SUTURE) IMPLANT
SUT VIC AB 2-0 CT1 27 (SUTURE) ×8
SUT VIC AB 2-0 CT1 TAPERPNT 27 (SUTURE) IMPLANT
SUT VICRYL 4-0 PS2 18IN ABS (SUTURE) ×2 IMPLANT
SYR 50ML SLIP (SYRINGE) IMPLANT
SYSTEM SAHARA CHEST DRAIN ATS (WOUND CARE) ×4 IMPLANT
TAPE CLOTH SURG 4X10 WHT LF (GAUZE/BANDAGES/DRESSINGS) ×1 IMPLANT
TAPE PAPER 2X10 WHT MICROPORE (GAUZE/BANDAGES/DRESSINGS) ×1 IMPLANT
TOWEL GREEN STERILE (TOWEL DISPOSABLE) ×8 IMPLANT
TOWEL GREEN STERILE FF (TOWEL DISPOSABLE) ×8 IMPLANT
TRAY FOLEY SLVR 16FR TEMP STAT (SET/KITS/TRAYS/PACK) ×4 IMPLANT
TUBING LAP HI FLOW INSUFFLATIO (TUBING) ×4 IMPLANT
UNDERPAD 30X36 HEAVY ABSORB (UNDERPADS AND DIAPERS) ×8 IMPLANT
WATER STERILE IRR 1000ML POUR (IV SOLUTION) ×8 IMPLANT

## 2022-01-19 NOTE — Procedures (Signed)
Extubation Procedure Note ? ?Patient Details:   ?Name: Jacob Acosta ?DOB: 23-Jul-1961 ?MRN: [id number] ?  ?Airway Documentation:  ?  ?Vent end date: 01/19/22 Vent end time: 2020  ? ?Evaluation ? O2 sats: stable throughout ?Complications: No apparent complications ?Patient did tolerate procedure well. ?Bilateral Breath Sounds: Clear, Diminished ?  ?Yes ? ?NIF: -30 ?FVC: 1L ?4L [place] ?Positive Cuff Leak ? ?Therron Sells V ?01/19/2022, 8:36 PM ? ?

## 2022-01-19 NOTE — Progress Notes (Signed)
?   ?  [address] ?      [name] [address] ?            [phone]      ? ?No events ? ?Vitals:  ? 01/19/22 0700 01/19/22 0800  ?BP: 107/71 117/79  ?Pulse: 71 65  ?Resp: 15 13  ?Temp:  98 ?F (36.7 ?C)  ?SpO2: 94% 92%  ? ?Alert NAD ?Sinus EWOB ? ?OR today for CABG with L radial artery harvest ? ?Corliss Skains ? ?

## 2022-01-19 NOTE — Telephone Encounter (Signed)
01/18/2022 ? ?FMLA forms faxed into our office, and put in Dr.Croitoru box for completion.  ?

## 2022-01-19 NOTE — Progress Notes (Signed)
ANTICOAGULATION CONSULT NOTE ? ?Pharmacy Consult for Heparin ?Indication: chest pain/ACS ? ?Allergies  ?Allergen Reactions  ? Ciprofloxacin Hcl Other (See Comments)  ?  Hallucinations ?  ? Codeine Anxiety and Other (See Comments)  ?  "Causes him to feel very anxious"  ? Metformin And Related Nausea Only and Palpitations  ? Penicillins Rash  ?  Has patient had a PCN reaction causing immediate rash, facial/tongue/throat swelling, SOB or lightheadedness with hypotension: Yes ?Has patient had a PCN reaction causing severe rash involving mucus membranes or skin necrosis: No ?Has patient had a PCN reaction that required hospitalization: No ?Has patient had a PCN reaction occurring within the last 10 years: Yes ?If all of the above answers are "NO", then may proceed with Cephalosporin use. ?  ? Toujeo Max [employer] [Insulin Glargine] Itching, Nausea Only, Palpitations and Other (See Comments)  ?  Itching throat  ? ? ?Patient Measurements: ?Height: 5\' 6"  (167.6 cm) ?Weight: 99.1 kg (218 lb 7.6 oz) ?IBW/kg (Calculated) : 63.8 ?HEPARIN DW (KG): 85.5  ? ?Vital Signs: ?Temp: 98 ?F (36.7 ?C) (03/08 0800) ?Temp Source: Oral (03/08 0800) ?BP: 117/79 (03/08 0800) ?Pulse Rate: 65 (03/08 0800) ? ?Labs: ?Recent Labs  ?  01/16/22 ?1818 01/16/22 ?2035 01/17/22 ?0217 01/17/22 ?03/19/22 01/18/22 ?0115 01/18/22 ?1241 01/19/22 ?0540  ?HGB 14.5  --  13.4  --  12.9*  --  14.0  ?HCT 45.0  --  40.8  --  39.2  --  41.9  ?PLT 262  --  253  --  242  --  241  ?HEPARINUNFRC  --   --  0.10*   < > 0.15* 0.48 0.63  ?CREATININE 1.03  --  0.77  --  0.97  --  0.98  ?TROPONINIHS 29* 77*  --   --   --   --   --   ? < > = values in this interval not displayed.  ? ?Estimated Creatinine Clearance: 86.1 mL/min (by C-G formula based on SCr of 0.98 mg/dL). ? ? ?Assessment: ?JF is a 32 YOM who presented to the ED with NSTEMI. PMH significant for HLD and DM. He's now s/p cath and has CABG planned for 01/19/22. Pharmacy consulted to restart IV heparin 2 hrs post TR band  removal. TR band removed around 1500 on 01/17/22.  ? ?Heparin level today is 0.63 (therapeutic x 2) on 1700 units/hr this morning. No bleeding or IV issues noted per RN. RN will hold heparin upon transport to surgery. ? ?Goal of Therapy:  ?Heparin level 0.3-0.7 units/ml ?Monitor platelets by anticoagulation protocol: Yes ?  ?Plan:  ?Continue heparin 1700 units/hr ?Plan to d/c heparin+level check before CABG ? ?03/19/22 Berline Semrad ?PharmD Candidate ?01/19/2022 8:42 AM ? ?

## 2022-01-19 NOTE — Op Note (Signed)
? ?   ?[address] ?      [place] [address] ?            [id number]      ?                                 ?  ? ?01/19/2022 ?Patient:  Jacob Acosta ?Pre-Op Dx: LM CAD ?NSTEMI ?HTN ?HLP   ?Post-op Dx:  same ?Procedure: ?CABG X 3.  LIMA LAD.  Left radial artery to OM1, RSVG PDA   ?Endoscopic greater saphenous vein harvest on the right ?Open left radial artery harvest ? ? ?Surgeon and Role:   ?   * Lajuana Matte, MD - Primary ?   Josie Saunders, PA-C - assisting ?An experienced assistant was required given the complexity of this surgery and the standard of surgical care. The assistant was needed for exposure, dissection, suctioning, retraction of delicate tissues and sutures, instrument exchange and for overall help during this procedure.   ? ?Anesthesia  general ?EBL:  520ml ?Blood Administration: none ?Xclamp Time:  43 min ?Pump Time:  75min ? ?Drains: 50 F blake drain: L, mediastinal  ?Wires: ventricular ?Counts: correct ? ? ?Indications: ?This is a 61 year old gentleman is admitted following an NSTEMI.  Left heart cath shows severe left main disease.  Echocardiogram is clean.  We will plan for CABG with left radial artery harvest on 01/19/2022. ? ?Findings: ?Good LIMA, radial and vein.  PDA stenosis was bridged.  Intramyocardial OM, descent sized LAD ? ?Operative Technique: ?All invasive lines were placed in pre-op holding.  After the risks, benefits and alternatives were thoroughly discussed, the patient was brought to the operative theatre.  Anesthesia was induced, and the patient was prepped and draped in normal sterile fashion.  An appropriate surgical pause was performed, and pre-operative antibiotics were dosed accordingly. ? ?We began with simultaneous incisions along the right leg for harvesting of the greater saphenous vein, the left arm for the radial artery and the chest for the sternotomy.  In regards to the sternotomy, this was carried down with bovie cautery, and the sternum  was divided with a reciprocating saw.  Meticulous hemostasis was obtained.  The left internal thoracic artery was exposed and harvested in in pedicled fashion.  The patient was systemically heparinized, and the artery was divided distally, and placed in a papaverine sponge.   ? ?The sternal elevator was removed, and a retractor was placed.  The pericardium was divided in the midline and fashioned into a cradle with pericardial stitches.   After we confirmed an appropriate ACT, the ascending aorta was cannulated in standard fashion.  The right atrial appendage was used for venous cannulation site.  Cardiopulmonary bypass was initiated, and the heart retractor was placed. The cross clamp was applied, and a dose of anterograde cardioplegia was given with good arrest of the heart.  We moved to the posterior wall of the heart, and found a good target on the PDA.  An arteriotomy was made, and the vein graft was anastomosed to it in an end to side fashion.  Next we exposed the lateral wall, and found a good target on the OM.  An end to side anastomosis with the radial artery graft was then created.  Finally, we exposed a good target on the  LAD, and fashioned an end to side anastomosis between it and the LITA.  We began to re-warm, and a re-animation dose of cardioplegia was given.  The heart was de-aired, and the cross clamp was removed.  Meticulous hemostasis was obtained.   ? ?A partial occludding clamp was then placed on the ascending aorta, and we created an end to side anastomosis between it and the proximal vein graft.  Rings were placed on the proximal anastomosis.  The radial artery was jumped off the hood of the vein graft.  Hemostasis was obtained, and we separated from cardiopulmonary bypass without event.  The heparin was reversed with protamine.  Chest tubes and wires were placed, and the sternum was re-approximated with sternal wires.  The soft tissue and skin were re-approximated wth absorbable suture.    ? ?The patient tolerated the procedure without any immediate complications, and was transferred to the ICU in guarded condition. ? ?Lajuana Matte ? ?

## 2022-01-19 NOTE — Anesthesia Procedure Notes (Addendum)
?  Central Venous Catheter Insertion ?Performed by: Dorris Singh, MD, anesthesiologist ?Start/End3/06/2022 12:20 PM, 01/19/2022 12:35 PM ?Patient location: Pre-op. ?Preanesthetic checklist: patient identified, IV checked, site marked, risks and benefits discussed, surgical consent, monitors and equipment checked, pre-op evaluation, timeout performed and anesthesia consent ?Position: Trendelenburg ?Lidocaine 1% used for infiltration and patient sedated ?Hand hygiene performed  and maximum sterile barriers used  ?Catheter size: 8.5 Fr ?Sheath introducer ?Procedure performed using ultrasound guided technique. ?Ultrasound Notes:anatomy identified, needle tip was noted to be adjacent to the nerve/plexus identified, no ultrasound evidence of intravascular and/or intraneural injection and image(s) printed for medical record ?Attempts: 1 ?Following insertion, line sutured and dressing applied. ?Post procedure assessment: blood return through all ports, free fluid flow and no air ? ?Patient tolerated the procedure well with no immediate complications. ? ? ? ?

## 2022-01-19 NOTE — Hospital Course (Addendum)
HPI: ? This is a 61 year old gentleman with past history of type 2 diabetes mellitus, dyslipidemia, obesity, and anxiety.  He smoked tobacco products while in high school but has not smoked since then.  Presented to the emergency room at [facility] last night reporting chest pain off and on for the previous 2 weeks.  He felt this was related to his allergies and he had been using albuterol inhaler for symptoms of shortness of breath.  Yesterday, he had an episode of chest pain while inside his house was associated with pallor, diaphoresis, and radiated to his jaw.  In the emergency room, his glucose was 460.  Chest x-ray showed no acute disease.  EKG showed sinus rhythm with no obvious ischemic changes.  Initial high-sensitivity troponin was elevated at 29 and later increased to 77.  After ruling in for non-ST elevation myocardial infarction, he was admitted to the hospital and transferred to [facility] for additional work-up.  He was started on a heparin drip.  His chest pain resolved.  He had a left heart catheterization earlier today that showed a 90% stenosis of the distal left main coronary artery.  There was TIMI I flow in the distal LAD.  There were right to left collaterals.  The circumflex coronary artery had a 60% mid lesion in the right coronary artery had a 70% proximal lesion.  Ejection fraction was estimated at 55 to 65%.  LVEDP was around 20.  We have been asked to evaluate [name] for consideration of urgent coronary bypass grafting. ?  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  He works out with [employer] and on a bicycle almost daily.  Last visit to the dentist was about 3 years ago.  He denies any current active dental issues.  He is right-handed.  ? ?Dr. Cliffton Asters discussed the need for coronary artery bypass grafting surgery. Potential risks, benefits, and complications of the surgery were discussed with the patient and he agreed to proceed  with surgery. Pre operative carotid duplex US showed no significant internal carotid artery stenosis bilaterally. ? ?Hospital Course: ?Patient underwent a CABG x 3. He was transported from the OR to [facility] ICU in stable condition. He was extubated early the evening of surgery. He was weaned of Levophed, Vasopressin, and Nitro drips. Theone Murdoch, a line, and foley were removed early in his post operative course. Once chest tube output decreased, they and epicardial pacing wires were removed on 03/10. He was transitioned off the Insulin drip. His pre op HGA1C was 9.1. He will need close medical follow up after discharge. He was volume overloaded and once off pressors, was diuresed. He was started on Lopressor and Amlodipine (for radial artery). He was felt surgically stable for transfer from the ICU to 4E for further convalescence on 03/10.  The patient was tachycardic and his Lopressor dose was titrated accordingly.  His blood sugars were elevated and his insulin regimen was adjusted to his home regimen. He was started on Plavix at discharge due to ACS on presentation. He is ambulating independently.  His surgical incisions are healing without evidence of infection.  He is medically stable for discharge home today. ? ?

## 2022-01-19 NOTE — Anesthesia Preprocedure Evaluation (Addendum)
Anesthesia Evaluation  ?Patient identified by MRN, date of birth, ID band ?Patient awake ? ? ? ?Reviewed: ?Allergy & Precautions, NPO status , Patient's Chart, lab work & pertinent test results ? ?Airway ?Mallampati: II ? ?TM Distance: >3 FB ? ? ? ? Dental ?  ?Pulmonary ?former smoker,  ?  ?breath sounds clear to auscultation ? ? ? ? ? ? Cardiovascular ?+ Past MI  ? ?Rhythm:Regular Rate:Normal ? ? ?  ?Neuro/Psych ?  ? GI/Hepatic ?negative GI ROS, Neg liver ROS,   ?Endo/Other  ?diabetes ? Renal/GU ?negative Renal ROS  ? ?  ?Musculoskeletal ? ? Abdominal ?  ?Peds ? Hematology ?  ?Anesthesia Other Findings ? ? Reproductive/Obstetrics ? ?  ? ? ? ? ? ? ? ? ? ? ? ? ? ?  ?  ? ? ? ? ? ? ? ? ?Anesthesia Physical ?Anesthesia Plan ? ?ASA: 3 ? ?Anesthesia Plan: General  ? ?Post-op Pain Management:   ? ?Induction: Intravenous ? ?PONV Risk Score and Plan: 3 and Ondansetron, Dexamethasone and Midazolam ? ?Airway Management Planned: Oral ETT ? ?Additional Equipment: Arterial line, Ultrasound Guidance Line Placement, TEE and CVP ? ?Intra-op Plan:  ? ?Post-operative Plan: Post-operative intubation/ventilation ? ?Informed Consent: I have reviewed the patients History and Physical, chart, labs and discussed the procedure including the risks, benefits and alternatives for the proposed anesthesia with the patient or authorized representative who has indicated his/her understanding and acceptance.  ? ? ? ?Dental advisory given ? ?Plan Discussed with: CRNA and Anesthesiologist ? ?Anesthesia Plan Comments:   ? ? ? ? ? ?Anesthesia Quick Evaluation ? ?

## 2022-01-19 NOTE — Progress Notes (Signed)
? ?   ?  [address] ?      [name] [address] ?            [phone]   ? ?S/p CABG x 3 ? ?Intubated, sedated ? ?BP (!) 91/48   Pulse 85   Temp 98 ?F (36.7 ?C) (Oral)   Resp 12   Ht 5\' 6"  (1.676 m)   Wt 99.1 kg   SpO2 96%   BMI 35.26 kg/m?  ?  ?CVp = 4 ? ? ?Intake/Output Summary (Last 24 hours) at 01/19/2022 1731 ?Last data filed at 01/19/2022 1700 ?Gross per 24 hour  ?Intake 3210.12 ml  ?Output 1085 ml  ?Net 2125.12 ml  ? ?Hct 33 ?Minimal CT output ? ?Doing well early postop ? ?03/21/2022 Salvatore Decent, MD ?[facility] ?[phone] ? ?

## 2022-01-19 NOTE — Anesthesia Postprocedure Evaluation (Signed)
Anesthesia Post Note ? ?Patient: Jacob Acosta ? ?Procedure(s) Performed: CORONARY ARTERY BYPASS GRAFTING (CABG), ON PUMP, TIMES THREE USING LEFT INTERNAL MAMMARY ARTERY AND RIGHT ENDOSCOPICALLY HARVESTED GREATER SAPHENOUS VEIN (Chest) ?RADIAL ARTERY HARVEST (Left: Arm Lower) ?TRANSESOPHAGEAL ECHOCARDIOGRAM (TEE) ? ?  ? ?Patient location during evaluation: SICU ?Anesthesia Type: General ?Level of consciousness: patient remains intubated per anesthesia plan ?Pain management: satisfactory to patient ?Vital Signs Assessment: post-procedure vital signs reviewed and stable ?Respiratory status: patient remains intubated per anesthesia plan ?Cardiovascular status: stable ?Postop Assessment: no apparent nausea or vomiting ?Anesthetic complications: no ? ? ?No notable events documented. ? ?Last Vitals:  ?Vitals:  ? 01/19/22 1147 01/19/22 1600  ?BP: (!) 142/78 (!) 91/48  ?Pulse: 75 85  ?Resp: 18 12  ?Temp:    ?SpO2: 97% 96%  ?  ?Last Pain:  ?Vitals:  ? 01/19/22 0800  ?TempSrc: Oral  ?PainSc: 0-No pain  ? ? ?  ?  ?  ?  ?  ?  ? ?Jacob Acosta ? ? ? ? ?

## 2022-01-19 NOTE — Anesthesia Procedure Notes (Signed)
Procedure Name: Intubation ?Date/Time: 01/19/2022 12:07 PM ?Performed by: Tillman Abide, CRNA ?Pre-anesthesia Checklist: Patient identified, Emergency Drugs available, Suction available and Patient being monitored ?Patient Re-evaluated:Patient Re-evaluated prior to induction ?Oxygen Delivery Method: Circle System Utilized ?Preoxygenation: Pre-oxygenation with 100% oxygen ?Induction Type: IV induction ?Laryngoscope Size: Glidescope and 3 ?Grade View: Grade I ?Tube type: Oral ?Tube size: 8.0 mm ?Number of attempts: 1 ?Airway Equipment and Method: Stylet ?Placement Confirmation: ETT inserted through vocal cords under direct vision, positive ETCO2 and breath sounds checked- equal and bilateral ?Secured at: 23 cm ?Tube secured with: Tape ?Dental Injury: Teeth and Oropharynx as per pre-operative assessment  ? ? ? ? ?

## 2022-01-19 NOTE — Plan of Care (Signed)

## 2022-01-19 NOTE — Progress Notes (Signed)
Dr. Chilton Si notified of pts cbg, surgery >4hrs, insulin received within 4 hours. No orders to continue with protocol ?

## 2022-01-19 NOTE — Progress Notes (Signed)
?  Echocardiogram ?Echocardiogram Transesophageal has been performed. ? ?Jacob Acosta ?01/19/2022, 1:15 PM ?

## 2022-01-19 NOTE — Brief Op Note (Signed)
01/16/2022 - 01/19/2022 ? ?2:46 PM ? ?PATIENT:  Jacob Acosta  61 y.o. male ? ?PRE-OPERATIVE DIAGNOSIS:  Coronary artery disease ? ?POST-OPERATIVE DIAGNOSIS:  Coronary artery disease ? ?PROCEDURE: TRANSESOPHAGEAL ECHOCARDIOGRAM (TEE), CORONARY ARTERY BYPASS GRAFTING (CABG) x 3 (LIMA to LAD, LEFT RADIAL ARTERY to OM, SVG to PDA) with EVH RIGHT GREATER SAPHENOUS VEIN from RIGHT THIGH, and LEFT RADIAL ARTERY HARVEST  ? ?SURGEON:  Surgeon(s) and Role: ?   Lightfoot, Eliezer Lofts, MD - Primary ? ?PHYSICIAN ASSISTANT: Doree Fudge PA-C ? ?ANESTHESIA:   general ? ?EBL:  Per anesthesia and perfusion record ? ?DRAINS:  Chest tubes placed in the mediastinal and pleural spaces   ? ?COUNTS CORRECT:  YES ? ?DICTATION: .Dragon Dictation ? ?PLAN OF CARE: Admit to inpatient  ? ?PATIENT DISPOSITION:  ICU - intubated and hemodynamically stable. ?  ?Delay start of Pharmacological VTE agent (>24hrs) due to surgical blood loss or risk of bleeding: no ? ?BASELINE WEIGHT: 99 kg ?

## 2022-01-19 NOTE — Transfer of Care (Signed)
Immediate Anesthesia Transfer of Care Note ? ?Patient: Jacob Acosta ? ?Procedure(s) Performed: CORONARY ARTERY BYPASS GRAFTING (CABG), ON PUMP, TIMES THREE USING LEFT INTERNAL MAMMARY ARTERY AND RIGHT ENDOSCOPICALLY HARVESTED GREATER SAPHENOUS VEIN (Chest) ?RADIAL ARTERY HARVEST (Left: Arm Lower) ?TRANSESOPHAGEAL ECHOCARDIOGRAM (TEE) ? ?Patient Location: ICU ? ?Anesthesia Type:General ? ?Level of Consciousness: sedated and Patient remains intubated per anesthesia plan ? ?Airway & Oxygen Therapy: Patient remains intubated per anesthesia plan and Patient placed on Ventilator (see vital sign flow sheet for setting) ? ?Post-op Assessment: Report given to RN and Post -op Vital signs reviewed and stable ? ?Post vital signs: Reviewed and stable ? ?Last Vitals:  ?Vitals Value Taken Time  ?BP 91/48 01/19/22 1600  ?Temp    ?Pulse 83 01/19/22 1616  ?Resp 12 01/19/22 1616  ?SpO2 96 % 01/19/22 1616  ?Vitals shown include unvalidated device data. ? ?Last Pain:  ?Vitals:  ? 01/19/22 0800  ?TempSrc: Oral  ?PainSc: 0-No pain  ?   ? ?  ? ?Complications: No notable events documented. ?

## 2022-01-20 ENCOUNTER — Inpatient Hospital Stay (HOSPITAL_COMMUNITY): Payer: 59

## 2022-01-20 ENCOUNTER — Encounter (HOSPITAL_COMMUNITY): Payer: Self-pay | Admitting: Thoracic Surgery (Cardiothoracic Vascular Surgery)

## 2022-01-20 LAB — BASIC METABOLIC PANEL
Anion gap: 7 (ref 5–15)
Anion gap: 9 (ref 5–15)
BUN: 11 mg/dL (ref 8–23)
BUN: 11 mg/dL (ref 8–23)
CO2: 22 mmol/L (ref 22–32)
CO2: 23 mmol/L (ref 22–32)
Calcium: 8.1 mg/dL — ABNORMAL LOW (ref 8.9–10.3)
Calcium: 8.5 mg/dL — ABNORMAL LOW (ref 8.9–10.3)
Chloride: 109 mmol/L (ref 98–111)
Chloride: 103 mmol/L (ref 98–111)
Creatinine, Ser: 0.97 mg/dL (ref 0.61–1.24)
Creatinine, Ser: 0.88 mg/dL (ref 0.61–1.24)
GFR, Estimated: >60 mL/min (ref >60)
GFR, Estimated: >60 mL/min (ref >60)
Glucose, Bld: 207 mg/dL — ABNORMAL HIGH (ref 70–99)
Glucose, Bld: 195 mg/dL — ABNORMAL HIGH (ref 70–99)
Potassium: 4.5 mmol/L (ref 3.5–5.1)
Potassium: 4.1 mmol/L (ref 3.5–5.1)
Sodium: 138 mmol/L (ref 135–145)
Sodium: 135 mmol/L (ref 135–145)

## 2022-01-20 LAB — CBC
HCT: 33 % — ABNORMAL LOW (ref 39.0–52.0)
HCT: 33.2 % — ABNORMAL LOW (ref 39.0–52.0)
Hemoglobin: 10.9 g/dL — ABNORMAL LOW (ref 13.0–17.0)
Hemoglobin: 11.1 g/dL — ABNORMAL LOW (ref 13.0–17.0)
MCH: 29.1 pg (ref 26.0–34.0)
MCH: 29.4 pg (ref 26.0–34.0)
MCHC: 33 g/dL (ref 30.0–36.0)
MCHC: 33.4 g/dL (ref 30.0–36.0)
MCV: 88 fL (ref 80.0–100.0)
MCV: 88.1 fL (ref 80.0–100.0)
Platelets: 185 10*3/uL (ref 150–400)
Platelets: 176 10*3/uL (ref 150–400)
RBC: 3.75 MIL/uL — ABNORMAL LOW (ref 4.22–5.81)
RBC: 3.77 MIL/uL — ABNORMAL LOW (ref 4.22–5.81)
RDW: 12.9 % (ref 11.5–15.5)
RDW: 13.1 % (ref 11.5–15.5)
WBC: 11 10*3/uL — ABNORMAL HIGH (ref 4.0–10.5)
WBC: 12.7 10*3/uL — ABNORMAL HIGH (ref 4.0–10.5)
nRBC: 0 % (ref 0.0–0.2)
nRBC: 0 % (ref 0.0–0.2)

## 2022-01-20 LAB — GLUCOSE, CAPILLARY
Glucose-Capillary: 133 mg/dL — ABNORMAL HIGH (ref 70–99)
Glucose-Capillary: 134 mg/dL — ABNORMAL HIGH (ref 70–99)
Glucose-Capillary: 152 mg/dL — ABNORMAL HIGH (ref 70–99)
Glucose-Capillary: 190 mg/dL — ABNORMAL HIGH (ref 70–99)
Glucose-Capillary: 190 mg/dL — ABNORMAL HIGH (ref 70–99)
Glucose-Capillary: 192 mg/dL — ABNORMAL HIGH (ref 70–99)
Glucose-Capillary: 194 mg/dL — ABNORMAL HIGH (ref 70–99)
Glucose-Capillary: 195 mg/dL — ABNORMAL HIGH (ref 70–99)
Glucose-Capillary: 195 mg/dL — ABNORMAL HIGH (ref 70–99)
Glucose-Capillary: 199 mg/dL — ABNORMAL HIGH (ref 70–99)
Glucose-Capillary: 202 mg/dL — ABNORMAL HIGH (ref 70–99)
Glucose-Capillary: 205 mg/dL — ABNORMAL HIGH (ref 70–99)
Glucose-Capillary: 209 mg/dL — ABNORMAL HIGH (ref 70–99)
Glucose-Capillary: 219 mg/dL — ABNORMAL HIGH (ref 70–99)
Glucose-Capillary: 230 mg/dL — ABNORMAL HIGH (ref 70–99)

## 2022-01-20 LAB — MAGNESIUM
Magnesium: 2.2 mg/dL (ref 1.7–2.4)
Magnesium: 1.8 mg/dL (ref 1.7–2.4)

## 2022-01-20 MED ORDER — AMLODIPINE BESYLATE 5 MG PO TABS
2.5000 mg | ORAL_TABLET | Freq: Every day | ORAL | Status: DC
Start: 2022-01-20 — End: 2022-01-23
  Administered 2022-01-20 – 2022-01-23 (×4): 2.5 mg via ORAL
  Filled 2022-01-20 (×4): qty 1

## 2022-01-20 MED ORDER — INSULIN ASPART 100 UNIT/ML IJ SOLN
0.0000 [IU] | INTRAMUSCULAR | Status: DC
  Administered 2022-01-20: 17:00:00 2 [IU] via SUBCUTANEOUS
  Administered 2022-01-20: 21:00:00 4 [IU] via SUBCUTANEOUS
  Administered 2022-01-20: 14:00:00 2 [IU] via SUBCUTANEOUS
  Administered 2022-01-21: 4 [IU] via SUBCUTANEOUS
  Administered 2022-01-21: 8 [IU] via SUBCUTANEOUS
  Administered 2022-01-21: 4 [IU] via SUBCUTANEOUS
  Administered 2022-01-21: 16 [IU] via SUBCUTANEOUS
  Administered 2022-01-21: 8 [IU] via SUBCUTANEOUS
  Administered 2022-01-21: 4 [IU] via SUBCUTANEOUS
  Administered 2022-01-22: 8 [IU] via SUBCUTANEOUS
  Administered 2022-01-22: 2 [IU] via SUBCUTANEOUS
  Administered 2022-01-22: 8 [IU] via SUBCUTANEOUS

## 2022-01-20 MED ORDER — ENOXAPARIN SODIUM 40 MG/0.4ML IJ SOSY
40.0000 mg | PREFILLED_SYRINGE | Freq: Every day | INTRAMUSCULAR | Status: DC
  Administered 2022-01-20 – 2022-01-21 (×2): 40 mg via SUBCUTANEOUS
  Filled 2022-01-20 (×2): qty 0.4

## 2022-01-20 MED FILL — Heparin Sodium (Porcine) Inj 1000 Unit/ML: Qty: 1000 | Status: AC

## 2022-01-20 MED FILL — Potassium Chloride Inj 2 mEq/ML: INTRAVENOUS | Qty: 40 | Status: AC

## 2022-01-20 MED FILL — Lidocaine HCl Local Preservative Free (PF) Inj 2%: INTRAMUSCULAR | Qty: 15 | Status: AC

## 2022-01-20 NOTE — Discharge Instructions (Signed)
Discharge Instructions: ? ?1. You may shower, please wash incisions daily with soap and water and keep dry.  If you wish to cover wounds with dressing you may do so but please keep clean and change daily.  No tub baths or swimming until incisions have completely healed.  If your incisions become red or develop any drainage please call our office at [phone] ? ?2. No Driving until cleared by Dr. Romilda Garret office and you are no longer using narcotic pain medications ? ?3. Monitor your weight daily.. Please use the same scale and weigh at same time... If you gain 5-10 lbs in 48 hours with associated lower extremity swelling, please contact our office at [phone] ? ?4. Fever of 101.5 for at least 24 hours with no source, please contact our office at [phone] ? ?5. Activity- up as tolerated, please walk at least 3 times per day.  Avoid strenuous activity, no lifting, pushing, or pulling with your arms over 8-10 lbs for a minimum of 6 weeks ? ?6. If any questions or concerns arise, please do not hesitate to contact our office at [phone]  ?

## 2022-01-20 NOTE — Progress Notes (Signed)
? ?   ?  [address] ?      [place] [address] ?            [phone]   ?              ?1 Day Post-Op ?Procedure(s) (LRB): ?CORONARY ARTERY BYPASS GRAFTING (CABG), ON PUMP, TIMES THREE USING LEFT INTERNAL MAMMARY ARTERY AND RIGHT ENDOSCOPICALLY HARVESTED GREATER SAPHENOUS VEIN (N/A) ?RADIAL ARTERY HARVEST (Left) ?TRANSESOPHAGEAL ECHOCARDIOGRAM (TEE) (N/A) ? ? ?Events: ?No events ?_______________________________________________________________ ?Vitals: ?BP 114/80   Pulse 85   Temp 100 ?F (37.8 ?C)   Resp 12   Ht 5\' 6"  (1.676 m)   Wt 104.2 kg   SpO2 98%   BMI 37.08 kg/m?  ?Filed Weights  ? 01/17/22 1730 01/19/22 0542 01/20/22 0530  ?Weight: 99 kg 99.1 kg 104.2 kg  ? ? ? ?- Neuro: alert NAD ? ?- Cardiovascular: sinus ? Drips: levo 5, vaso 0.03, nitro.   ?CVP:  [3 mmHg-13 mmHg] 6 mmHg ? ?- Pulm: EWOB ? ? ?ABG ?   ?Component Value Date/Time  ? PHART 7.275 (L) 01/19/2022 2122  ? PCO2ART 44.6 01/19/2022 2122  ? PO2ART 137 (H) 01/19/2022 2122  ? HCO3 20.5 01/19/2022 2122  ? TCO2 22 01/19/2022 2122  ? ACIDBASEDEF 6.0 (H) 01/19/2022 2122  ? O2SAT 99 01/19/2022 2122  ? ? ?- Abd: ND ?- Extremity: warm ? ?Marland KitchenIntake/Output   ?   03/08 0701 ?03/09 0700 03/09 0701 ?03/10 0700  ? P.O.    ? I.V. (mL/kg) 3502.6 (33.6)   ? IV Piggyback [id number]   ? Total Intake(mL/kg) 4634.6 (44.5)   ? Urine (mL/kg/hr) 1985 (0.8) 120 (0.9)  ? Stool    ? Chest Tube 170 20  ? Total Output 2155 140  ? Net +2479.6 -140  ?     ?  ? ? ?_______________________________________________________________ ?Labs: ?CBC Latest Ref Rng & Units 01/20/2022 01/19/2022 01/19/2022  ?WBC 4.0 - 10.5 K/uL 11.0(H) 10.5 -  ?Hemoglobin 13.0 - 17.0 g/dL 10.9(L) 12.0(L) 10.5(L)  ?Hematocrit 39.0 - 52.0 % 33.0(L) 35.7(L) 31.0(L)  ?Platelets 150 - 400 K/uL 185 186 -  ? ?CMP Latest Ref Rng & Units 01/20/2022 01/19/2022 01/19/2022  ?Glucose 70 - 99 mg/dL 207(H) 152(H) -  ?BUN 8 - 23 mg/dL 11 11 -  ?Creatinine 0.61 - 1.24 mg/dL 0.97 0.89 -  ?Sodium 135 - 145 mmol/L 138 138 142   ?Potassium 3.5 - 5.1 mmol/L 4.5 4.5 4.3  ?Chloride 98 - 111 mmol/L 109 111 -  ?CO2 22 - 32 mmol/L 22 22 -  ?Calcium 8.9 - 10.3 mg/dL 8.1(L) 8.2(L) -  ?Total Protein 6.5 - 8.1 g/dL - - -  ?Total Bilirubin 0.3 - 1.2 mg/dL - - -  ?Alkaline Phos 38 - 126 U/L - - -  ?AST 15 - 41 U/L - - -  ?ALT 0 - 44 U/L - - -  ? ? ?CXR: ?PV congestion ? ?_______________________________________________________________ ? ?Assessment and Plan: ?POD 1 s/p CABG ? ?Neuro: pain controlled ?CV: on A/S.  Weaning levo.  Adding amlodipine, and stopping nitro.  Will remove A line once off vaso ?Pulm: pulm hyg ?Renal: creat stable. ?GI: diest ?Heme: stable ?ID: afebrile ?Endo: SSI ?Dispo: continue ICU care ? ? ?Lucile Crater Ellaree Gear ?01/20/2022 8:20 AM ? ? ?

## 2022-01-20 NOTE — Progress Notes (Signed)
Patient ID: Jacob Acosta, male   DOB: July 06, 1961, 61 y.o.   MRN: [id number] ?TCTS Evening Rounds: ? ?Hemodynamically stable in sinus rhythm. Off all vasopressors. ? ?Urine output ok ? ?CT output low. ? ?BMET ?   ?Component Value Date/Time  ? NA 135 01/20/2022 1733  ? K 4.1 01/20/2022 1733  ? CL 103 01/20/2022 1733  ? CO2 23 01/20/2022 1733  ? GLUCOSE 195 (H) 01/20/2022 1733  ? BUN 11 01/20/2022 1733  ? CREATININE 0.88 01/20/2022 1733  ? CREATININE 0.74 11/30/2017 1241  ? CALCIUM 8.5 (L) 01/20/2022 1733  ? GFRNONAA >60 01/20/2022 1733  ? GFRNONAA 102 11/30/2017 1241  ? ?CBC ?   ?Component Value Date/Time  ? WBC 12.7 (H) 01/20/2022 1733  ? RBC 3.77 (L) 01/20/2022 1733  ? HGB 11.1 (L) 01/20/2022 1733  ? HCT 33.2 (L) 01/20/2022 1733  ? PLT 176 01/20/2022 1733  ? MCV 88.1 01/20/2022 1733  ? MCH 29.4 01/20/2022 1733  ? MCHC 33.4 01/20/2022 1733  ? RDW 13.1 01/20/2022 1733  ? LYMPHSABS 2.6 01/28/2013 1243  ? MONOABS 0.7 01/28/2013 1243  ? EOSABS 0.2 01/28/2013 1243  ? BASOSABS 0.0 01/28/2013 1243  ? ?Ambulated a little today. ?

## 2022-01-20 NOTE — Plan of Care (Signed)
?  Problem: Respiratory: Goal: Respiratory status will improve Outcome: Progressing   Problem: Cardiac: Goal: Will achieve and/or maintain hemodynamic stability Outcome: Progressing   Problem: Urinary Elimination: Goal: Ability to achieve and maintain adequate renal perfusion and functioning will improve Outcome: Progressing   Problem: Activity: Goal: Risk for activity intolerance will decrease Outcome: Progressing   Problem: Education: Goal: Will demonstrate proper wound care and an understanding of methods to prevent future damage Outcome: Progressing   

## 2022-01-20 NOTE — Discharge Summary (Cosign Needed)
Physician Discharge Summary       [address]       [name] [address]             [phone]    Patient ID: Jacob Acosta MRN: [id number] DOB/AGE: 08/11/1961 61 y.o.  Admit date: 01/16/2022 Discharge date: 01/23/2022  Admission Diagnoses: NSTEMI (non-ST elevated myocardial infarction) (HCC) 2. Coronary artery disease Discharge Diagnoses:  S/p CABG x 3 Expected post op blood loss anemia History of diabetes mellitus without complication (HCC) 4. History of high cholesterol 5. History of anxiety   Consults: None  Procedure (s):  CABG X 3.  LIMA LAD.  Left radial artery to OM1, RSVG PDA   Endoscopic greater saphenous vein harvest on the right Open left radial artery harvest by Dr. Cliffton Asters on 01/19/2022.  HPI:  This is a 61 year old gentleman with past history of type 2 diabetes mellitus, dyslipidemia, obesity, and anxiety.  He smoked tobacco products while in high school but has not smoked since then.  Presented to the emergency room at [facility] last night reporting chest pain off and on for the previous 2 weeks.  He felt this was related to his allergies and he had been using albuterol inhaler for symptoms of shortness of breath.  Yesterday, he had an episode of chest pain while inside his house was associated with pallor, diaphoresis, and radiated to his jaw.  In the emergency room, his glucose was 460.  Chest x-ray showed no acute disease.  EKG showed sinus rhythm with no obvious ischemic changes.  Initial high-sensitivity troponin was elevated at 29 and later increased to 77.  After ruling in for non-ST elevation myocardial infarction, he was admitted to the hospital and transferred to [facility] for additional work-up.  He was started on a heparin drip.  His chest pain resolved.  He had a left heart catheterization earlier today that showed a 90% stenosis of the distal left main coronary artery.  There was TIMI I flow in the distal LAD.   There were right to left collaterals.  The circumflex coronary artery had a 60% mid lesion in the right coronary artery had a 70% proximal lesion.  Ejection fraction was estimated at 55 to 65%.  LVEDP was around 20.  We have been asked to evaluate Jacob Acosta for consideration of urgent coronary bypass grafting.   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  He works out with [employer] and on a bicycle almost daily.  Last visit to the dentist was about 3 years ago.  He denies any current active dental issues.  He is right-handed.   Dr. Cliffton Asters discussed the need for coronary artery bypass grafting surgery. Potential risks, benefits, and complications of the surgery were discussed with the patient and he agreed to proceed with surgery. Pre operative carotid duplex US showed no significant internal carotid artery stenosis bilaterally.  Hospital Course: Patient underwent a CABG x 3. He was transported from the OR to [facility] ICU in stable condition. He was extubated early the evening of surgery. He was weaned of Levophed, Vasopressin, and Nitro drips. Theone Murdoch, a line, and foley were removed early in his post operative course. Once chest tube output decreased, they and epicardial pacing wires were removed on 03/10. He was transitioned off the Insulin drip. His pre op HGA1C was 9.1. He will need close medical follow up after discharge. He was volume overloaded and once off pressors, was diuresed.  He was started on Lopressor and Amlodipine (for radial artery). He was felt surgically stable for transfer from the ICU to 4E for further convalescence on 03/10.  The patient was tachycardic and his Lopressor dose was titrated accordingly.  His blood sugars were elevated and his insulin regimen was adjusted to his home regimen. He was started on Plavix at discharge due to ACS on presentation. He is ambulating independently.  His surgical incisions are healing without evidence of infection.  He is  medically stable for discharge home today.   Latest Vital Signs: Blood pressure 121/72, pulse 93, temperature 98.1 F (36.7 C), temperature source Oral, resp. rate 18, height  (1.676 m), weight 101.4 kg, SpO2 94 %.  Physical Exam:  General appearance: alert, cooperative, and no distress Heart: regular rate and rhythm Lungs: clear to auscultation bilaterally Abdomen: soft, non-tender; bowel sounds normal; no masses,  no organomegaly Extremities: edema trace Wound: clean and dry  Discharge Condition:Stable and discharged to home.  Recent laboratory studies:  Lab Results  Component Value Date   WBC 10.6 (H) 01/22/2022   HGB 10.2 (L) 01/22/2022   HCT 30.7 (L) 01/22/2022   MCV 86.7 01/22/2022   PLT 172 01/22/2022   Lab Results  Component Value Date   NA 133 (L) 01/22/2022   K 3.7 01/22/2022   CL 101 01/22/2022   CO2 26 01/22/2022   CREATININE 0.80 01/22/2022   GLUCOSE 202 (H) 01/22/2022      Diagnostic Studies: DG Chest 2 View  Result Date: 01/16/2022 CLINICAL DATA:  Chest pain EXAM: CHEST - 2 VIEW COMPARISON:  12/10/2017 FINDINGS: The heart size and mediastinal contours are within normal limits. Both lungs are clear. The visualized skeletal structures are unremarkable. IMPRESSION: No active cardiopulmonary disease. Electronically Signed   By: Elige Ko M.D.   On: 01/16/2022 17:45   CARDIAC CATHETERIZATION  Result Date: 01/17/2022 Conclusions: Critical distal LMCA stenosis with 90% eccentric narrowing and TIMI-1 flow in the distal LAD (also supplied by right-to-left collaterals). Moderate-severe LCx and RCA disease with 60% mid/distal LCx and 70% proximal RCA lesions. Normal left ventricular systolic function (LVEF 55-65%) with mildly elevated filling pressure (LVEDP 20-25 mmHg). Recommendations: Cardiac surgery consultation for CABG; transfer to 2H for close monitoring pending surgical evaluation. Restart heparin infusion 2 hours after TR band removal. Aggressive  secondary prevention. Yvonne Kendall, MD [facility]  DG Chest Port 1 View  Result Date: 01/21/2022 CLINICAL DATA:  Chest pain with CABG x3 01/19/2022. EXAM: PORTABLE CHEST 1 VIEW COMPARISON:  Portable chest yesterday at 5:56 a.m. FINDINGS: 5:18 a.m., 01/21/2022. Right IJ sheath/central line combination tip again projects in the mid SVC. Left basilar chest tube positioning is unaltered with no visible pneumothorax. The cardiac size is stable. Slight central vascular fullness is unchanged with no evidence of edema. Left basilar atelectasis or consolidation demonstrates mild improvement in the interval. Remainder of the lungs remain clear. There is no new abnormality. CABG changes and intact median sternotomy sutures. IMPRESSION: Improvement in left basilar atelectasis or consolidation. In all other respects no further changes. No new abnormality. No visible pneumothorax. Electronically Signed   By: Almira Bar M.D.   On: 01/21/2022 07:32   DG Chest Port 1 View  Result Date: 01/20/2022 CLINICAL DATA:  Chest tube.  Postop open heart surgery EXAM: PORTABLE CHEST 1 VIEW COMPARISON:  01/19/2022 FINDINGS: Endotracheal tube and NG tube removed Right jugular central venous catheter tip remains in the SVC. Left basilar chest tube unchanged in position. Negative  for pneumothorax. Progression of left lower lobe atelectasis. No significant pleural effusion. Negative for edema. IMPRESSION: Left chest tube remains in place.  No pneumothorax Progression of left lower lobe atelectasis. Endotracheal tube and NG tube removed. Electronically Signed   By: Marlan Palau M.D.   On: 01/20/2022 08:04   DG Chest Port 1 View  Result Date: 01/19/2022 CLINICAL DATA:  Pneumothorax. EXAM: PORTABLE CHEST 1 VIEW COMPARISON:  January 16, 2022 FINDINGS: Postsurgical changes from CABG. Endotracheal tube in satisfactory position. Right-sided central venous catheter terminates in the expected location distal superior vena cava. Surgical  drains in place. Enteric catheter with side hole just past the GE junction. Cardiomediastinal silhouette is normal. Streaky airspace opacities in the left mid lung field. No radiographically apparent pneumothorax. Osseous structures are without acute abnormality. Soft tissues are grossly normal. IMPRESSION: 1. No radiographically apparent pneumothorax. 2. Streaky airspace opacities in the left mid lung field. 3. Endotracheal tube in satisfactory position. Electronically Signed   By: Ted Mcalpine M.D.   On: 01/19/2022 16:41   ECHOCARDIOGRAM COMPLETE  Result Date: 01/18/2022    ECHOCARDIOGRAM REPORT   Patient Name:   IVY MERIWETHER [facility] Date of Exam: 01/18/2022 Medical Rec #:  [id number]      Height:       66.0 in Accession #:    [id number]     Weight:       218.3 lb Date of Birth:  June 21, 1961      BSA:          2.075 m Patient Age:    61 years       BP:           119/80 mmHg Patient Gender: M              HR:           59 bpm. Exam Location:  Inpatient Procedure: 2D Echo, 3D Echo, Cardiac Doppler, Color Doppler and Intracardiac            Opacification Agent Indications:    122-I22.9 Subsequent ST elevation (STEM) and non-ST elevation                 (NSTEMI) myocardial infarction  History:        Patient has no prior history of Echocardiogram examinations. CAD                 and Acute MI; Risk Factors:Diabetes and Dyslipidemia.  Sonographer:    Sheralyn Boatman RDCS Referring Phys: [phone] CHRISTOPHER END  Sonographer Comments: Technically difficult study due to poor echo windows, suboptimal parasternal window, suboptimal apical window and suboptimal subcostal window. Image acquisition challenging due to patient body habitus. IMPRESSIONS  1. Left ventricular ejection fraction, by estimation, is 60 to 65%. Left ventricular ejection fraction by 3D volume is 60 %. The left ventricle has normal function. The left ventricle has no regional wall motion abnormalities. There is mild left ventricular hypertrophy. Left ventricular  diastolic parameters are consistent with Grade I diastolic dysfunction (impaired relaxation).  2. Right ventricular systolic function is normal. The right ventricular size is normal.  3. The mitral valve is grossly normal. Trivial mitral valve regurgitation.  4. The aortic valve is tricuspid. Aortic valve regurgitation is not visualized.  5. Aortic dilatation noted. There is mild dilatation of the ascending aorta, measuring 40 mm.  6. The inferior vena cava is dilated in size with >50% respiratory variability, suggesting right atrial pressure of 8 mmHg. Comparison(s): No prior Echocardiogram. FINDINGS  Left Ventricle: Left ventricular ejection fraction, by estimation, is 60 to 65%. Left ventricular ejection fraction by 3D volume is 60 %. The left ventricle has normal function. The left ventricle has no regional wall motion abnormalities. Definity contrast agent was given IV to delineate the left ventricular endocardial borders. The left ventricular internal cavity size was normal in size. There is mild left ventricular hypertrophy. Left ventricular diastolic parameters are consistent with Grade I  diastolic dysfunction (impaired relaxation). Indeterminate filling pressures. Right Ventricle: The right ventricular size is normal. No increase in right ventricular wall thickness. Right ventricular systolic function is normal. Left Atrium: Left atrial size was normal in size. Right Atrium: Right atrial size was normal in size. Pericardium: There is no evidence of pericardial effusion. Mitral Valve: The mitral valve is grossly normal. Trivial mitral valve regurgitation. Tricuspid Valve: The tricuspid valve is grossly normal. Tricuspid valve regurgitation is trivial. Aortic Valve: The aortic valve is tricuspid. Aortic valve regurgitation is not visualized. Pulmonic Valve: The pulmonic valve was not well visualized. Pulmonic valve regurgitation is not visualized. Aorta: Aortic dilatation noted. There is mild dilatation of  the ascending aorta, measuring 40 mm. Venous: The inferior vena cava is dilated in size with greater than 50% respiratory variability, suggesting right atrial pressure of 8 mmHg. IAS/Shunts: No atrial level shunt detected by color flow Doppler.  LEFT VENTRICLE PLAX 2D LVIDd:         4.30 cm         Diastology LVIDs:         3.20 cm         LV e' medial:    5.98 cm/s LV PW:         1.20 cm         LV E/e' medial:  12.0 LV IVS:        1.20 cm         LV e' lateral:   10.33 cm/s LVOT diam:     2.30 cm         LV E/e' lateral: 7.0 LV SV:         96 LV SV Index:   46 LVOT Area:     4.15 cm        3D Volume EF                                LV 3D EF:    Left                                             ventricul LV Volumes (MOD)                            ar LV vol d, MOD    100.4 ml                   ejection A2C:                                        fraction LV vol d, MOD    87.1 ml  by 3D A4C:                                        volume is LV vol s, MOD    42.9 ml                    60 %. A2C: LV vol s, MOD    36.8 ml A4C:                           3D Volume EF: LV SV MOD A2C:   57.6 ml       3D EF:        60 % LV SV MOD A4C:   87.1 ml       LV EDV:       137 ml LV SV MOD BP:    54.0 ml       LV ESV:       55 ml                                LV SV:        82 ml RIGHT VENTRICLE             IVC RV S prime:     16.50 cm/s  IVC diam: 2.10 cm TAPSE (M-mode): 2.1 cm LEFT ATRIUM             Index        RIGHT ATRIUM           Index LA diam:        3.40 cm 1.64 cm/m   RA Area:     12.20 cm LA Vol (A2C):   49.6 ml 23.90 ml/m  RA Volume:   26.50 ml  12.77 ml/m LA Vol (A4C):   40.6 ml 19.56 ml/m LA Biplane Vol: 46.7 ml 22.50 ml/m  AORTIC VALVE LVOT Vmax:   114.00 cm/s LVOT Vmean:  74.700 cm/s LVOT VTI:    0.231 m  AORTA Ao Root diam: 3.60 cm Ao Asc diam:  3.90 cm MITRAL VALVE MV Area (PHT): 3.08 cm    SHUNTS MV Decel Time: 246 msec    Systemic VTI:  0.23 m MV E velocity: 72.00 cm/s  Systemic Diam:  2.30 cm MV A velocity: 59.10 cm/s MV E/A ratio:  1.22 Zoila Shutter MD Electronically signed by Zoila Shutter MD Signature Date/Time: 01/18/2022/12:37:22 PM    Final    ECHO INTRAOPERATIVE TEE  Result Date: 01/21/2022  *INTRAOPERATIVE TRANSESOPHAGEAL REPORT *  Patient Name:   TYRESE CAPRIOTTI [facility] Date of Exam: 01/19/2022 Medical Rec #:  [id number]      Height:       66.0 in Accession #:    [id number]     Weight:       218.5 lb Date of Birth:  11-May-1961      BSA:          2.08 m Patient Age:    61 years       BP:           142/78 mmHg Patient Gender: M              HR:           75 bpm. Exam Location:  Inpatient Transesophogeal exam  was perform intraoperatively during surgical procedure. Patient was closely monitored under general anesthesia during the entirety of examination. Indications:     CABG Performing Phys: [id number] Eliezer Lofts LIGHTFOOT Diagnosing Phys: Dorris Singh MD Complications: No known complications during this procedure. POST-OP IMPRESSIONS _ Left Ventricle: Post Op The patient came off bypass on the first attempt. There did not appear to be any new findings from preop exam. Left ventricular contraction did improve with volume replacement. The TEE that had been placed after general anesthesia induction without difficulty was removed uneventfully at the end of the surgery. The patient was later taken to the SICU in stable condition. PRE-OP FINDINGS  Left Ventricle: The left ventricle has normal systolic function, with an ejection fraction of 55-60%. Right Ventricle: The right ventricle has normal systolic function. Left Atrium: No left atrial/left atrial appendage thrombus was detected. Mitral Valve: The mitral valve is normal in structure. Mitral valve regurgitation is trivial by color flow Doppler. Aortic Valve: The aortic valve is normal in structure. Pulmonic Valve: The pulmonic valve was normal in structure.   Dorris Singh MD Electronically signed by Dorris Singh MD Signature Date/Time:  01/21/2022/7:55:00 PM   Final    VAS US DOPPLER PRE CABG  Result Date: 01/17/2022 PREOPERATIVE VASCULAR EVALUATION Patient Name:  LADISLAV CASELLI [facility]  Date of Exam:   01/17/2022 Medical Rec #: [id number]       Accession #:    [id number] Date of Birth: 1960-11-19       Patient Gender: M Patient Age:   34 years Exam Location:  [facility] Procedure:      VAS US DOPPLER PRE CABG Referring Phys: HARRELL LIGHTFOOT --------------------------------------------------------------------------------  Indications:      Pre-CABG. Risk Factors:     Diabetes, past history of smoking, prior MI. Comparison Study: No prior study Performing Technologist: Gertie Fey MHA, RDMS, RVT, RDCS  Examination Guidelines: A complete evaluation includes B-mode imaging, spectral Doppler, color Doppler, and power Doppler as needed of all accessible portions of each vessel. Bilateral testing is considered an integral part of a complete examination. Limited examinations for reoccurring indications may be performed as noted.  Right Carotid Findings: +----------+--------+--------+--------+------------------+---------+             PSV cm/s EDV cm/s Stenosis Describe           Comments   +----------+--------+--------+--------+------------------+---------+  CCA Prox   93       21                                              +----------+--------+--------+--------+------------------+---------+  CCA Distal 68       17                                              +----------+--------+--------+--------+------------------+---------+  ICA Prox   60       16                focal and calcific Shadowing  +----------+--------+--------+--------+------------------+---------+  ICA Distal 70       24                                              +----------+--------+--------+--------+------------------+---------+  ECA        68       11                                              +----------+--------+--------+--------+------------------+---------+  +----------+--------+-------+----------------+------------+             PSV cm/s EDV cms Describe         Arm Pressure  +----------+--------+-------+----------------+------------+  Subclavian 140              Multiphasic, WNL               +----------+--------+-------+----------------+------------+ +---------+--------+--+--------+-+---------+  Vertebral PSV cm/s 27 EDV cm/s 9 Antegrade  +---------+--------+--+--------+-+---------+ Left Carotid Findings: +----------+--------+--------+--------+---------------------+------------------+             PSV cm/s EDV cm/s Stenosis Describe              Comments            +----------+--------+--------+--------+---------------------+------------------+  CCA Prox   204      43                                                          +----------+--------+--------+--------+---------------------+------------------+  CCA Distal 101      28                                      intimal thickening  +----------+--------+--------+--------+---------------------+------------------+  ICA Prox   64       19                focal and hyperechoic                     +----------+--------+--------+--------+---------------------+------------------+  ICA Distal 77       33                                                          +----------+--------+--------+--------+---------------------+------------------+  ECA        119      17                                                          +----------+--------+--------+--------+---------------------+------------------+ +----------+--------+--------+----------------+------------+  Subclavian PSV cm/s EDV cm/s Describe         Arm Pressure  +----------+--------+--------+----------------+------------+             235               Multiphasic, WNL               +----------+--------+--------+----------------+------------+ +---------+--------+--+--------+--+---------+  Vertebral PSV cm/s 66 EDV cm/s 22 Antegrade   +---------+--------+--+--------+--+---------+  ABI Findings: +--------+------------------+-----+---------+--------+  Right    Rt Pressure (mmHg) Index Waveform  Comment   +--------+------------------+-----+---------+--------+  Brachial  triphasic TR band   +--------+------------------+-----+---------+--------+  PTA                               triphasic           +--------+------------------+-----+---------+--------+  DP                                triphasic           +--------+------------------+-----+---------+--------+ +--------+------------------+-----+---------+-------+  Left     Lt Pressure (mmHg) Index Waveform  Comment  +--------+------------------+-----+---------+-------+  Brachial 149                      triphasic          +--------+------------------+-----+---------+-------+  PTA                               triphasic          +--------+------------------+-----+---------+-------+  DP                                triphasic          +--------+------------------+-----+---------+-------+  Right Doppler Findings: +-----------+--------+-----+---------+----------------------------------+  Site        Pressure Index Doppler   Comments                            +-----------+--------+-----+---------+----------------------------------+  Brachial                   triphasic TR band                             +-----------+--------+-----+---------+----------------------------------+  Radial                     triphasic                                     +-----------+--------+-----+---------+----------------------------------+  Ulnar                      triphasic                                     +-----------+--------+-----+---------+----------------------------------+  Palmar Arch                          Not evaluated secondary to TR band  +-----------+--------+-----+---------+----------------------------------+  Left Doppler Findings:  +-----------+--------+-----+---------+-----------------------------------------+  Site        Pressure Index Doppler   Comments                                   +-----------+--------+-----+---------+-----------------------------------------+  Brachial    149            triphasic                                            +-----------+--------+-----+---------+-----------------------------------------+  Radial                     triphasic                                            +-----------+--------+-----+---------+-----------------------------------------+  Ulnar                      triphasic                                            +-----------+--------+-----+---------+-----------------------------------------+  Palmar Arch                          Signal decreases >50% with radial                                                compression, is unaffected with ulnar                                            compression                                +-----------+--------+-----+---------+-----------------------------------------+  Summary: Right Carotid: The extracranial vessels were near-normal with only minimal wall                thickening or plaque. Left Carotid: The extracranial vessels were near-normal with only minimal wall               thickening or plaque. Vertebrals:  Bilateral vertebral arteries demonstrate antegrade flow. Subclavians: Normal flow hemodynamics were seen in bilateral subclavian              arteries. Right Upper Extremity: No significant arterial obstruction detected in the right upper extremity. Left Upper Extremity: No significant arterial obstruction detected in the left upper extremity. Doppler waveforms decrease >50% with left radial compression. Doppler waveforms remain within normal limits with left ulnar compression.  Pedal arteries are within normal limits at rest, bilaterally. Electronically signed by Lemar Livings MD on 01/17/2022 at 5:11:46 PM.    Final        Discharge Medications: Allergies as of 01/23/2022       Reactions   Ciprofloxacin Hcl Other (See Comments)   Hallucinations   Codeine Anxiety, Other (See Comments)   "Causes him to feel very anxious"   Metformin And Related Nausea Only, Palpitations   Penicillins Rash   Has patient had a PCN reaction causing immediate rash, facial/tongue/throat swelling, SOB or lightheadedness with hypotension: Yes Has patient had a PCN reaction causing severe rash involving mucus membranes or skin necrosis: No Has patient had a PCN reaction that required hospitalization: No Has patient had a PCN reaction occurring within the last 10 years: Yes If all of the above answers are "NO", then may proceed with Cephalosporin use.   Toujeo Max Solostar [insulin Glargine] Itching, Nausea Only, Palpitations, Other (See Comments)   Itching  throat        Medication List     STOP taking these medications    BETA CAROTENE PO   cephALEXin 500 MG capsule Commonly known as: KEFLEX   tamsulosin 0.4 MG Caps capsule Commonly known as: FLOMAX       TAKE these medications    acetaminophen 500 MG tablet Commonly known as: TYLENOL Take 1-2 tablets (500-1,000 mg total) by mouth every 6 (six) hours as needed for mild pain or fever.   albuterol 108 (90 Base) MCG/ACT inhaler Commonly known as: VENTOLIN HFA Inhale 1-2 puffs into the lungs every 6 (six) hours as needed for wheezing or shortness of breath.   amLODipine 2.5 MG tablet Commonly known as: NORVASC Take 1 tablet (2.5 mg total) by mouth daily.   aspirin 81 MG tablet Take 1 tablet (81 mg total) by mouth daily. What changed: how much to take   Basaglar KwikPen 100 UNIT/ML Inject 30 Units into the skin in the morning and at bedtime.   CENTRUM SILVER 50+MEN PO Take 1 tablet by mouth daily.   cetirizine 10 MG tablet Commonly known as: ZyrTEC Allergy Take 1 tablet (10 mg total) by mouth daily.   clopidogrel 75 MG tablet Commonly known as:  Plavix Take 1 tablet (75 mg total) by mouth daily.   fluticasone 50 MCG/ACT nasal spray Commonly known as: FLONASE Place 1 spray into both nostrils 2 (two) times daily.   fluticasone-salmeterol 250-50 MCG/ACT Aepb Commonly known as: Advair Diskus Inhale 1 puff into the lungs in the morning and at bedtime. Rinse mouth with water after each use   furosemide 40 MG tablet Commonly known as: Lasix Take 1 tablet (40 mg total) by mouth daily.   hydroxypropyl methylcellulose / hypromellose 2.5 % ophthalmic solution Commonly known as: ISOPTO TEARS / GONIOVISC Place 1 drop into both eyes 2 (two) times daily as needed for dry eyes.   KRILL OIL PO Take 1 capsule by mouth daily.   metoprolol tartrate 25 MG tablet Commonly known as: LOPRESSOR Take 1 tablet (25 mg total) by mouth 2 (two) times daily.   potassium chloride SA 20 MEQ tablet Commonly known as: KLOR-CON M Take 1 tablet (20 mEq total) by mouth daily.   promethazine-dextromethorphan 6.25-15 MG/5ML syrup Commonly known as: PROMETHAZINE-DM Take 5 mLs by mouth 4 (four) times daily as needed. What changed: reasons to take this   rosuvastatin 40 MG tablet Commonly known as: CRESTOR Take 1 tablet (40 mg total) by mouth daily. What changed:  medication strength how much to take when to take this additional instructions   traMADol 50 MG tablet Commonly known as: ULTRAM Take 1 tablet (50 mg total) by mouth every 6 (six) hours as needed for moderate pain.   vitamin C 500 MG tablet Commonly known as: ASCORBIC ACID Take 1,000 mg by mouth daily.      The patient has been discharged on:   1.Beta Blocker:  Yes [ x  ]                              No   [   ]                              If No, reason:  2.Ace Inhibitor/ARB: Yes [   ]  No  [  X  ]                                     If No, reason: labile BP, titration of BB  3.Statin:   Yes [ x  ]                  No  [   ]                   If No, reason:  4.Ecasa:  Yes  [ x  ]                  No   [   ]                  If No, reason:  Patient had ACS upon admission:  Plavix/P2Y12 inhibitor: Yes [ X  ]                                      No  [   ]   Follow Up Appointments:  Follow-up Information     Lightfoot, Eliezer Lofts, MD Follow up.   Specialty: Cardiothoracic Surgery Why: Appointment is VIRTUAL (by telephone);please do NOT go to the office. Dr. Cliffton Asters will call you at Contact information: [address] [place] [address] [phone]         Benita Stabile, MD. Call.   Specialty: Internal Medicine Why: for a follow up appointment regarding further surveillnace of HGA1C 9.1 and management of diabetes Contact information: [address] [place] [facility] [address] [phone]         Jodelle Gross, NP Follow up.   Specialties: Nurse Practitioner, Radiology, Cardiology Why: Hospital follow-up with Cardiology scheduled for 02/04/2022 at 10:05am. Please arrive 15 minutes early for check-in. If this date/time does not work for you, please call our office to reschedule. Contact information: [address] [place] [address] [phone]                 Signed: Carl Best 01/23/2022, 10:09 AM

## 2022-01-21 ENCOUNTER — Other Ambulatory Visit (HOSPITAL_COMMUNITY): Payer: Self-pay

## 2022-01-21 ENCOUNTER — Inpatient Hospital Stay (HOSPITAL_COMMUNITY): Payer: 59

## 2022-01-21 LAB — TYPE AND SCREEN
ABO/RH(D): A POS
Antibody Screen: NEGATIVE
Unit division: 0
Unit division: 0
Unit division: 0
Unit division: 0

## 2022-01-21 LAB — GLUCOSE, CAPILLARY
Glucose-Capillary: 177 mg/dL — ABNORMAL HIGH (ref 70–99)
Glucose-Capillary: 192 mg/dL — ABNORMAL HIGH (ref 70–99)
Glucose-Capillary: 197 mg/dL — ABNORMAL HIGH (ref 70–99)
Glucose-Capillary: 214 mg/dL — ABNORMAL HIGH (ref 70–99)
Glucose-Capillary: 221 mg/dL — ABNORMAL HIGH (ref 70–99)
Glucose-Capillary: 228 mg/dL — ABNORMAL HIGH (ref 70–99)
Glucose-Capillary: 321 mg/dL — ABNORMAL HIGH (ref 70–99)

## 2022-01-21 LAB — BPAM RBC
Blood Product Expiration Date: 202303102359
Blood Product Expiration Date: 202303102359
Blood Product Expiration Date: 202303222359
Blood Product Expiration Date: 202303222359
ISSUE DATE / TIME: 202303081751
ISSUE DATE / TIME: 202303081807
Unit Type and Rh: 6200
Unit Type and Rh: 6200
Unit Type and Rh: 6200
Unit Type and Rh: 6200

## 2022-01-21 LAB — BASIC METABOLIC PANEL
Anion gap: 9 (ref 5–15)
BUN: 13 mg/dL (ref 8–23)
CO2: 25 mmol/L (ref 22–32)
Calcium: 8.6 mg/dL — ABNORMAL LOW (ref 8.9–10.3)
Chloride: 98 mmol/L (ref 98–111)
Creatinine, Ser: 0.94 mg/dL (ref 0.61–1.24)
GFR, Estimated: >60 mL/min (ref >60)
Glucose, Bld: 193 mg/dL — ABNORMAL HIGH (ref 70–99)
Potassium: 4.2 mmol/L (ref 3.5–5.1)
Sodium: 132 mmol/L — ABNORMAL LOW (ref 135–145)

## 2022-01-21 LAB — CBC
HCT: 32.9 % — ABNORMAL LOW (ref 39.0–52.0)
Hemoglobin: 10.6 g/dL — ABNORMAL LOW (ref 13.0–17.0)
MCH: 29 pg (ref 26.0–34.0)
MCHC: 32.2 g/dL (ref 30.0–36.0)
MCV: 89.9 fL (ref 80.0–100.0)
Platelets: 171 10*3/uL (ref 150–400)
RBC: 3.66 MIL/uL — ABNORMAL LOW (ref 4.22–5.81)
RDW: 13.2 % (ref 11.5–15.5)
WBC: 12.5 10*3/uL — ABNORMAL HIGH (ref 4.0–10.5)
nRBC: 0 % (ref 0.0–0.2)

## 2022-01-21 LAB — ECHO INTRAOPERATIVE TEE
Height: 66 in
Weight: 3495.61 oz

## 2022-01-21 MED ORDER — INSULIN GLARGINE-YFGN 100 UNIT/ML ~~LOC~~ SOLN
15.0000 [IU] | Freq: Two times a day (BID) | SUBCUTANEOUS | Status: DC
  Administered 2022-01-21 (×2): 15 [IU] via SUBCUTANEOUS
  Filled 2022-01-21 (×5): qty 0.15

## 2022-01-21 MED ORDER — SODIUM CHLORIDE 0.9 % IV SOLN: 250.0000 mL | INTRAVENOUS | Status: DC | PRN

## 2022-01-21 MED ORDER — SODIUM CHLORIDE 0.9% FLUSH
3.0000 mL | Freq: Two times a day (BID) | INTRAVENOUS | Status: DC
  Administered 2022-01-21 – 2022-01-22 (×3): 3 mL via INTRAVENOUS

## 2022-01-21 MED ORDER — ALUM & MAG HYDROXIDE-SIMETH 200-200-20 MG/5ML PO SUSP
30.0000 mL | ORAL | Status: DC | PRN
  Administered 2022-01-21: 30 mL via ORAL
  Filled 2022-01-21: qty 30

## 2022-01-21 MED ORDER — SODIUM CHLORIDE 0.9% FLUSH: 3.0000 mL | INTRAVENOUS | Status: DC | PRN

## 2022-01-21 MED ORDER — ~~LOC~~ CARDIAC SURGERY, PATIENT & FAMILY EDUCATION: Freq: Once | Status: AC

## 2022-01-21 NOTE — Progress Notes (Signed)
? ?   ?  [address] ?      [place] [address] ?            [phone]   ?              ?2 Days Post-Op ?Procedure(s) (LRB): ?CORONARY ARTERY BYPASS GRAFTING (CABG), ON PUMP, TIMES THREE USING LEFT INTERNAL MAMMARY ARTERY AND RIGHT ENDOSCOPICALLY HARVESTED GREATER SAPHENOUS VEIN (N/A) ?RADIAL ARTERY HARVEST (Left) ?TRANSESOPHAGEAL ECHOCARDIOGRAM (TEE) (N/A) ? ? ?Events: ?No events ?_______________________________________________________________ ?Vitals: ?BP 102/60   Pulse 100   Temp 98.2 ?F (36.8 ?C) (Oral)   Resp 12   Ht 5\' 6"  (1.676 m)   Wt 104.7 kg   SpO2 92%   BMI 37.26 kg/m?  ?Filed Weights  ? 01/19/22 0542 01/20/22 0530 01/21/22 0500  ?Weight: 99.1 kg 104.2 kg 104.7 kg  ? ? ? ?- Neuro: alert NAD ? ?- Cardiovascular: sinus ? Drips: none ?CVP:  [3 mmHg-10 mmHg] 8 mmHg ? ?- Pulm: EWOB ? ? ?ABG ?   ?Component Value Date/Time  ? PHART 7.275 (L) 01/19/2022 2122  ? PCO2ART 44.6 01/19/2022 2122  ? PO2ART 137 (H) 01/19/2022 2122  ? HCO3 20.5 01/19/2022 2122  ? TCO2 22 01/19/2022 2122  ? ACIDBASEDEF 6.0 (H) 01/19/2022 2122  ? O2SAT 99 01/19/2022 2122  ? ? ?- Abd: ND ?- Extremity: warm ? ?2123Intake/Output   ?   03/09 0701 ?03/10 0700 03/10 0701 ?03/11 0700  ? P.O. 650   ? I.V. (mL/kg) 366.4 (3.5)   ? IV Piggyback 100.4   ? Total Intake(mL/kg) 1116.8 (10.7)   ? Urine (mL/kg/hr) 1265 (0.5)   ? Chest Tube 140   ? Total Output 1405   ? Net -288.2   ?     ?  ? ? ?_______________________________________________________________ ?Labs: ?CBC Latest Ref Rng & Units 01/21/2022 01/20/2022 01/20/2022  ?WBC 4.0 - 10.5 K/uL 12.5(H) 12.7(H) 11.0(H)  ?Hemoglobin 13.0 - 17.0 g/dL 10.6(L) 11.1(L) 10.9(L)  ?Hematocrit 39.0 - 52.0 % 32.9(L) 33.2(L) 33.0(L)  ?Platelets 150 - 400 K/uL 171 176 185  ? ?CMP Latest Ref Rng & Units 01/21/2022 01/20/2022 01/20/2022  ?Glucose 70 - 99 mg/dL 03/22/2022) 256(L) 893(T)  ?BUN 8 - 23 mg/dL 13 11 11   ?Creatinine 0.61 - 1.24 mg/dL 342(A 7.68  ?Sodium 135 - 145 mmol/L 132(L) 135 138  ?Potassium  3.5 - 5.1 mmol/L 4.2 4.1 4.5  ?Chloride 98 - 111 mmol/L 98 103 109  ?CO2 22 - 32 mmol/L 25 23 22   ?Calcium 8.9 - 10.3 mg/dL 1.15) 7.26) 8.1(L)  ?Total Protein 6.5 - 8.1 g/dL - - -  ?Total Bilirubin 0.3 - 1.2 mg/dL - - -  ?Alkaline Phos 38 - 126 U/L - - -  ?AST 15 - 41 U/L - - -  ?ALT 0 - 44 U/L - - -  ? ? ?CXR: ?PV congestion ? ?_______________________________________________________________ ? ?Assessment and Plan: ?POD 2 s/p CABG ? ?Neuro: pain controlled ?CV: on A/S/BB, and amlodipine.   ?Pulm: pulm hyg ?Renal: creat stable. ?GI: diest ?Heme: stable ?ID: afebrile ?Endo: SSI ?Dispo: floor ? ? ? Jacob Acosta ?01/21/2022 7:41 AM ? ? ?

## 2022-01-21 NOTE — Plan of Care (Signed)
  Problem: Education: Goal: Knowledge of General Education information will improve Description: Including pain rating scale, medication(s)/side effects and non-pharmacologic comfort measures Outcome: Progressing   Problem: Health Behavior/Discharge Planning: Goal: Ability to manage health-related needs will improve Outcome: Progressing   Problem: Clinical Measurements: Goal: Ability to maintain clinical measurements within normal limits will improve Outcome: Progressing Goal: Will remain free from infection Outcome: Progressing Goal: Diagnostic test results will improve Outcome: Progressing Goal: Respiratory complications will improve Outcome: Progressing Goal: Cardiovascular complication will be avoided Outcome: Progressing   Problem: Activity: Goal: Risk for activity intolerance will decrease Outcome: Progressing   Problem: Nutrition: Goal: Adequate nutrition will be maintained Outcome: Progressing   Problem: Coping: Goal: Level of anxiety will decrease Outcome: Progressing   Problem: Elimination: Goal: Will not experience complications related to bowel motility Outcome: Progressing Goal: Will not experience complications related to urinary retention Outcome: Progressing   Problem: Pain Managment: Goal: General experience of comfort will improve Outcome: Progressing   Problem: Safety: Goal: Ability to remain free from injury will improve Outcome: Progressing   Problem: Skin Integrity: Goal: Risk for impaired skin integrity will decrease Outcome: Progressing   Problem: Education: Goal: Understanding of CV disease, CV risk reduction, and recovery process will improve Outcome: Progressing Goal: Individualized Educational Video(s) Outcome: Progressing   Problem: Activity: Goal: Ability to return to baseline activity level will improve Outcome: Progressing   Problem: Cardiovascular: Goal: Ability to achieve and maintain adequate cardiovascular perfusion  will improve Outcome: Progressing Goal: Vascular access site(s) Level 0-1 will be maintained Outcome: Progressing   Problem: Health Behavior/Discharge Planning: Goal: Ability to safely manage health-related needs after discharge will improve Outcome: Progressing   Problem: Education: Goal: Will demonstrate proper wound care and an understanding of methods to prevent future damage Outcome: Progressing Goal: Knowledge of disease or condition will improve Outcome: Progressing Goal: Knowledge of the prescribed therapeutic regimen will improve Outcome: Progressing Goal: Individualized Educational Video(s) Outcome: Progressing   Problem: Activity: Goal: Risk for activity intolerance will decrease Outcome: Progressing   Problem: Cardiac: Goal: Will achieve and/or maintain hemodynamic stability Outcome: Progressing   Problem: Clinical Measurements: Goal: Postoperative complications will be avoided or minimized Outcome: Progressing   Problem: Respiratory: Goal: Respiratory status will improve Outcome: Progressing   Problem: Skin Integrity: Goal: Wound healing without signs and symptoms of infection Outcome: Progressing Goal: Risk for impaired skin integrity will decrease Outcome: Progressing   Problem: Urinary Elimination: Goal: Ability to achieve and maintain adequate renal perfusion and functioning will improve Outcome: Progressing   

## 2022-01-21 NOTE — TOC Initial Note (Signed)
Transition of Care (TOC) - Initial/Assessment Note  ? ? ?Patient Details  ?Name: Jacob Acosta ?MRN: [id number] ?Date of Birth: Jun 19, 1961 ? ?Transition of Care (TOC) CM/SW Contact:    ?Graves-Bigelow, Lamar Laundry, RN ?Phone Number: ?01/21/2022, 4:35 PM ? ?Clinical Narrative:  Risk for readmission assessment completed. PTA patient was independent from home with spouse and the plan is to return home once stable. Patient states he was driving to appointments prior and he was getting medications without any issues. Spouse was at the bedside during the conversation and both she and the patient are aware that the Case Manager will continue to follow the patient for additional transition of care needs.                 ? ?Expected Discharge Plan: Home/Self Care ?Barriers to Discharge: Continued Medical Work up ? ? ?Patient Goals and CMS Choice ?Patient states their goals for this hospitalization and ongoing recovery are:: to return home with family support ?  ?Choice offered to / list presented to : NA ? ?Expected Discharge Plan and Services ?Expected Discharge Plan: Home/Self Care ?In-house Referral: NA ?Discharge Planning Services: CM Consult ?Post Acute Care Choice: NA ?Living arrangements for the past 2 months: Single Family Home ?                ?DME Arranged: N/A ?DME Agency: NA ?  ?  ?  ?HH Arranged: NA ? ? ?Prior Living Arrangements/Services ?Living arrangements for the past 2 months: Single Family Home ?Lives with:: Self, Spouse ?Patient language and need for interpreter reviewed:: Yes ?Do you feel safe going back to the place where you live?: Yes      ?Need for Family Participation in Patient Care: Yes (Comment) ?Care giver support system in place?: Yes (comment) ?  ?Criminal Activity/Legal Involvement Pertinent to Current Situation/Hospitalization: No - Comment as needed ? ?Activities of Daily Living ?Home Assistive Devices/Equipment: CBG Meter, Blood pressure cuff ?ADL Screening (condition at time of  admission) ?Patient's cognitive ability adequate to safely complete daily activities?: Yes ?Is the patient deaf or have difficulty hearing?: No ?Does the patient have difficulty seeing, even when wearing glasses/contacts?: No ?Does the patient have difficulty concentrating, remembering, or making decisions?: No ?Patient able to express need for assistance with ADLs?: Yes ?Does the patient have difficulty dressing or bathing?: No ?Independently performs ADLs?: Yes (appropriate for developmental age) ?Communication: Independent ?Dressing (OT): Independent ?Grooming: Independent ?Feeding: Independent ?Bathing: Independent ?Toileting: Independent ?In/Out Bed: Independent ?Walks in Home: Independent ?Does the patient have difficulty walking or climbing stairs?: No ?Weakness of Legs: None ?Weakness of Arms/Hands: None ? ?Permission Sought/Granted ?Permission sought to share information with : Family Supports, Case Manager ?   ? ?Emotional Assessment ?Appearance:: Appears stated age ?Attitude/Demeanor/Rapport: Engaged ?Affect (typically observed): Appropriate ?Orientation: : Oriented to Situation, Oriented to  Time, Oriented to Place, Oriented to Self ?Alcohol / Substance Use: Not Applicable ?Psych Involvement: No (comment) ? ?Admission diagnosis:  ACS (acute coronary syndrome) (HCC) [I24.9] ?NSTEMI (non-ST elevated myocardial infarction) (HCC) [I21.4] ?Coronary artery disease [I25.10] ?Patient Active Problem List  ? Diagnosis Date Noted  ? Coronary artery disease 01/19/2022  ? NSTEMI (non-ST elevated myocardial infarction) (HCC) 01/17/2022  ? ACS (acute coronary syndrome) (HCC) 01/16/2022  ? Personal history of noncompliance with medical treatment, presenting hazards to health 12/13/2017  ? Uncontrolled type 2 diabetes mellitus with hyperglycemia (HCC) 08/30/2017  ? Mixed hyperlipidemia 08/30/2017  ? Physical exam, annual 01/28/2013  ? Overweight(278.02) 01/28/2013  ? History of  nephrolithiasis 01/28/2013  ? Tendonitis of  ankle or foot 01/28/2013  ? ?PCP:  Benita Stabile, MD ?Pharmacy:   ?[id number] - [place] - [address] [facility] ?[address] ?[place] [address] ?Phone: [phone] Fax: [phone] ? ?[facility] ?[address] ?[place] [address] ?Phone: [phone] Fax: [phone] ? ?Readmission Risk Interventions ?Readmission Risk Prevention Plan 01/21/2022  ?Transportation Screening Complete  ?PCP or Specialist Appt within 3-5 Days Complete  ?HRI or Home Care Consult Complete  ?Social Work Consult for Recovery Care Planning/Counseling Complete  ?Palliative Care Screening Not Applicable  ?Medication Review Oceanographer) Complete  ?Some recent data might be hidden  ? ? ? ?

## 2022-01-21 NOTE — Telephone Encounter (Signed)
01/21/2022 ? ?Received forms back from provider and faxed to [employer].  ?

## 2022-01-21 NOTE — Progress Notes (Addendum)
Inpatient Diabetes Program Recommendations ? ?AACE/ADA: New Consensus Statement on Inpatient Glycemic Control (2015) ? ?Target Ranges:  Prepandial:   less than 140 mg/dL ?     Peak postprandial:   less than 180 mg/dL (1-2 hours) ?     Critically ill patients:  140 - 180 mg/dL  ? ? Latest Reference Range & Units 01/21/22 00:07 01/21/22 04:05 01/21/22 07:56  ?Glucose-Capillary 70 - 99 mg/dL 177 (H) ? ?4 units Novolog ? 197 (H) ? ?4 units Novolog ? 321 (H) ? ?16 units Novolog ?  ? ? ? ?Diabetes history: DM2 ? ?Outpatient Diabetes meds: Basaglar 30 units bid ? ?Current Orders: Novolog 0-24 units Q4 hours ? ? ? ? ?MD- Note CBG 321 this AM.  Pt takes Basaglar Insulin 30 units BID at home. ? ?Please consider starting Semglee 15 units BID to start (50% home dose to start) ? ?Please start this AM ? ? ?Addendum 1:45pm--Met w/ pt and wife at bedside today to discuss starting Freestyle Libre 2 CGM for home after pt discharged.  Pt and wife still very interested and requesting samples.  Gave pt and wife 2 CGM sensors and asked pt and wife to please not place the initial sensor until pt ready to go home.  Pt/ wife educated on application and use of Freestyle Libre 2 CGM.  Reviewed Freestyle Libre 2 CGM system (how to use, how to place new sensor, sensor life, troubleshooting, warm-up time, how to use app on phone, rotation of insertion sites, etc). Asked wife to help pt download the [employer] 2 app onto phone.  Pt educated to check fingerstick CBG with traditional CBG meter when glucose reading does not match how he feels.  Reviewed healthy CBG goals for home.  Pt asked to seek refills for the sensors from her PCP.  ? ? ? ? ?--Will follow patient during hospitalization-- ? ?Wyn Quaker RN, MSN, CDE ?Diabetes Coordinator ?Inpatient Glycemic Control Team ?Team Pager: [phone] (8a-5p) ? ?

## 2022-01-21 NOTE — Progress Notes (Signed)
CARDIAC REHAB PHASE I  ? ?Offered to walk with pt. Pt states ambulation twice already with a third planned for later. Began some d/c education with pt and spouse. Pt educated on importance of sternal precautions, ambulation, IS use, and sitting in recliner. Pt denies questions or concerns at this time. Pt assisted to BR, BR call light within reach. Will continue to follow. ? ?[phone] ?Reynold Bowen, RN BSN ?01/21/2022 ?3:31 PM ? ?

## 2022-01-22 LAB — BASIC METABOLIC PANEL
Anion gap: 6 (ref 5–15)
BUN: 12 mg/dL (ref 8–23)
CO2: 26 mmol/L (ref 22–32)
Calcium: 8.4 mg/dL — ABNORMAL LOW (ref 8.9–10.3)
Chloride: 101 mmol/L (ref 98–111)
Creatinine, Ser: 0.8 mg/dL (ref 0.61–1.24)
GFR, Estimated: >60 mL/min (ref >60)
Glucose, Bld: 202 mg/dL — ABNORMAL HIGH (ref 70–99)
Potassium: 3.7 mmol/L (ref 3.5–5.1)
Sodium: 133 mmol/L — ABNORMAL LOW (ref 135–145)

## 2022-01-22 LAB — CBC
HCT: 30.7 % — ABNORMAL LOW (ref 39.0–52.0)
Hemoglobin: 10.2 g/dL — ABNORMAL LOW (ref 13.0–17.0)
MCH: 28.8 pg (ref 26.0–34.0)
MCHC: 33.2 g/dL (ref 30.0–36.0)
MCV: 86.7 fL (ref 80.0–100.0)
Platelets: 172 10*3/uL (ref 150–400)
RBC: 3.54 MIL/uL — ABNORMAL LOW (ref 4.22–5.81)
RDW: 13.1 % (ref 11.5–15.5)
WBC: 10.6 10*3/uL — ABNORMAL HIGH (ref 4.0–10.5)
nRBC: 0 % (ref 0.0–0.2)

## 2022-01-22 LAB — GLUCOSE, CAPILLARY
Glucose-Capillary: 206 mg/dL — ABNORMAL HIGH (ref 70–99)
Glucose-Capillary: 144 mg/dL — ABNORMAL HIGH (ref 70–99)
Glucose-Capillary: 201 mg/dL — ABNORMAL HIGH (ref 70–99)
Glucose-Capillary: 147 mg/dL — ABNORMAL HIGH (ref 70–99)
Glucose-Capillary: 178 mg/dL — ABNORMAL HIGH (ref 70–99)

## 2022-01-22 MED ORDER — INSULIN ASPART 100 UNIT/ML IJ SOLN
0.0000 [IU] | Freq: Three times a day (TID) | INTRAMUSCULAR | Status: DC
  Administered 2022-01-22: 2 [IU] via SUBCUTANEOUS
  Administered 2022-01-22 – 2022-01-23 (×2): 8 [IU] via SUBCUTANEOUS
  Administered 2022-01-23: 4 [IU] via SUBCUTANEOUS

## 2022-01-22 MED ORDER — INSULIN GLARGINE-YFGN 100 UNIT/ML ~~LOC~~ SOLN
30.0000 [IU] | Freq: Two times a day (BID) | SUBCUTANEOUS | Status: DC
  Administered 2022-01-22 – 2022-01-23 (×3): 30 [IU] via SUBCUTANEOUS
  Filled 2022-01-22 (×4): qty 0.3

## 2022-01-22 MED ORDER — METOPROLOL TARTRATE 25 MG/10 ML ORAL SUSPENSION
25.0000 mg | Freq: Two times a day (BID) | ORAL | Status: DC
  Filled 2022-01-22: qty 10

## 2022-01-22 MED ORDER — METOPROLOL TARTRATE 25 MG PO TABS
25.0000 mg | ORAL_TABLET | Freq: Two times a day (BID) | ORAL | Status: DC
  Administered 2022-01-22 – 2022-01-23 (×3): 25 mg via ORAL
  Filled 2022-01-22 (×3): qty 1

## 2022-01-22 NOTE — Progress Notes (Addendum)
? ?   ?  [address] ?      [place] [address] ?            [phone]   ? ?  ?3 Days Post-Op Procedure(s) (LRB): ?CORONARY ARTERY BYPASS GRAFTING (CABG), ON PUMP, TIMES THREE USING LEFT INTERNAL MAMMARY ARTERY AND RIGHT ENDOSCOPICALLY HARVESTED GREATER SAPHENOUS VEIN (N/A) ?RADIAL ARTERY HARVEST (Left) ?TRANSESOPHAGEAL ECHOCARDIOGRAM (TEE) (N/A) ? ?Subjective: ? ?No new complaints.  Up at sink getting ready to get cleaned up.  He has not yet moved his bowels but is passing gas. ? ?Objective: ?Vital signs in last 24 hours: ?Temp:  [97.7 ?F (36.5 ?C)-98.9 ?F (37.2 ?C)] 98.5 ?F (36.9 ?C) (03/11 0744) ?Pulse Rate:  [93-106] 105 (03/11 0744) ?Cardiac Rhythm: Sinus tachycardia (03/11 0800) ?Resp:  [10-22] 19 (03/11 0744) ?BP: (95-129)/(63-87) 114/74 (03/11 0744) ?SpO2:  [85 %-96 %] 92 % (03/11 0744) ?Weight:  [104.4 kg] 104.4 kg (03/11 0419) ? ?Intake/Output from previous day: ?03/10 0701 - 03/11 0700 ?In: 486 [P.O.:480; I.V.:6] ?Out: 1260 [Urine:1260] ?Intake/Output this shift: ?Total I/O ?In: 320 [P.O.:320] ?Out: 200 [Urine:200] ? ?General appearance: alert, cooperative, and no distress ?Heart: regular rate and rhythm and tachycardic ?Lungs: clear to auscultation bilaterally ?Abdomen: soft, mild distention ?Extremities: edema trace ?Wound: clean and dry ? ?Lab Results: ?Recent Labs  ?  01/21/22 ?0402 01/22/22 ?0132  ?WBC 12.5* 10.6*  ?HGB 10.6* 10.2*  ?HCT 32.9* 30.7*  ?PLT 171 172  ? ?BMET:  ?Recent Labs  ?  01/21/22 ?0402 01/22/22 ?0132  ?NA 132* 133*  ?K 4.2 3.7  ?CL 98 101  ?CO2 25 26  ?GLUCOSE 193* 202*  ?BUN 13 12  ?CREATININE 0.94 0.80  ?CALCIUM 8.6* 8.4*  ?  ?PT/INR:  ?Recent Labs  ?  01/19/22 ?1616  ?LABPROT 16.3*  ?INR 1.3*  ? ?ABG ?   ?Component Value Date/Time  ? PHART 7.275 (L) 01/19/2022 2122  ? HCO3 20.5 01/19/2022 2122  ? TCO2 22 01/19/2022 2122  ? ACIDBASEDEF 6.0 (H) 01/19/2022 2122  ? O2SAT 99 01/19/2022 2122  ? ?CBG (last 3)  ?Recent Labs  ?  01/21/22 ?2338 01/22/22 ?0423  01/22/22 ?3329  ?GLUCAP 214* 206* 144*  ? ? ?Assessment/Plan: ?S/P Procedure(s) (LRB): ?CORONARY ARTERY BYPASS GRAFTING (CABG), ON PUMP, TIMES THREE USING LEFT INTERNAL MAMMARY ARTERY AND RIGHT ENDOSCOPICALLY HARVESTED GREATER SAPHENOUS VEIN (N/A) ?RADIAL ARTERY HARVEST (Left) ?TRANSESOPHAGEAL ECHOCARDIOGRAM (TEE) (N/A) ? ?CV- Sinus Tachycardia- will increase Lopressor to 25 mg BID if BP allows, continue Norvasc for RA graft ?Pulm- off oxygen, no acute issues ?Renal- creatinine is normal, weight is mildly elevated, no current on Lasix ?Expected post operative blood loss anemia, mild ?DM- sugars elevated will increase insulin to home regimen of 30 units BID ?Dispo- patient stable, doing well, will titrate Lopressor for additional HR control as BB allows, continue Norvasc for RA graft.Marland Kitchen if remains stable will d/c home in AM ? ? LOS: 6 days  ? ? ?Lowella Dandy, PA-C ?01/22/2022 ? ?Doing well ?Titrating meds ?Home soon ? ? ?Corliss Skains ? ?

## 2022-01-22 NOTE — Plan of Care (Signed)
  Problem: Education: Goal: Knowledge of General Education information will improve Description Including pain rating scale, medication(s)/side effects and non-pharmacologic comfort measures Outcome: Progressing   Problem: Health Behavior/Discharge Planning: Goal: Ability to manage health-related needs will improve Outcome: Progressing   Problem: Clinical Measurements: Goal: Respiratory complications will improve Outcome: Progressing   Problem: Activity: Goal: Risk for activity intolerance will decrease Outcome: Progressing   Problem: Nutrition: Goal: Adequate nutrition will be maintained Outcome: Progressing   Problem: Coping: Goal: Level of anxiety will decrease Outcome: Progressing   Problem: Pain Managment: Goal: General experience of comfort will improve Outcome: Progressing   

## 2022-01-22 NOTE — Progress Notes (Signed)
CARDIAC REHAB PHASE I  ? ?PRE:  Rate/Rhythm: 106  ? ?BP:  Sitting: 113/75   ?  SaO2: 93RA ? ?MODE:  Ambulation: 470 ft  ? ?POST:  Rate/Rhythm: 109 ? ?BP:  Sitting: 131/80    ?  SaO2: 93RA ? ?Pt tolerated exercise Well and AMB 470 ft with front wheel [name], and standby assist. Pt had no rest break, chest pain, SOB or pain, just mild fatigue. Education given to pt on heart healthy diet, sternal precautions, spirometer usage, wound care, and home exercise guidelines given. Will refer to cardiac rehab phase [facility] at [facility]. Pt left in the bed with call bell in reach and family/friends in the room. Pt will have family to stay upon discharge for 1 week. Pt verbalize understanding and all questions were answered. ? ? ?[name] ACSM-CEP ?01/22/2022 ?11:17 AM ? ?

## 2022-01-23 DIAGNOSIS — I249 Acute ischemic heart disease, unspecified: Secondary | ICD-10-CM | POA: Diagnosis not present

## 2022-01-23 LAB — GLUCOSE, CAPILLARY
Glucose-Capillary: 212 mg/dL — ABNORMAL HIGH (ref 70–99)
Glucose-Capillary: 172 mg/dL — ABNORMAL HIGH (ref 70–99)

## 2022-01-23 MED ORDER — FUROSEMIDE 40 MG PO TABS: 40.0000 mg | ORAL_TABLET | Freq: Every day | ORAL | 0 refills | Status: DC

## 2022-01-23 MED ORDER — METOPROLOL TARTRATE 25 MG PO TABS: 25.0000 mg | ORAL_TABLET | Freq: Two times a day (BID) | ORAL | 3 refills | Status: DC

## 2022-01-23 MED ORDER — AMLODIPINE BESYLATE 2.5 MG PO TABS: 2.5000 mg | ORAL_TABLET | Freq: Every day | ORAL | 0 refills | Status: DC

## 2022-01-23 MED ORDER — ROSUVASTATIN CALCIUM 40 MG PO TABS: 40.0000 mg | ORAL_TABLET | Freq: Every day | ORAL | 3 refills | Status: DC

## 2022-01-23 MED ORDER — TRAMADOL HCL 50 MG PO TABS: 50.0000 mg | ORAL_TABLET | Freq: Four times a day (QID) | ORAL | 0 refills | Status: DC | PRN

## 2022-01-23 MED ORDER — POTASSIUM CHLORIDE CRYS ER 20 MEQ PO TBCR
20.0000 meq | EXTENDED_RELEASE_TABLET | Freq: Every day | ORAL | 0 refills | Status: DC
Start: 2022-01-23 — End: 2022-02-04

## 2022-01-23 MED ORDER — ACETAMINOPHEN 500 MG PO TABS
500.0000 mg | ORAL_TABLET | Freq: Four times a day (QID) | ORAL | 0 refills | Status: DC | PRN
Start: 2022-01-23 — End: 2022-03-16

## 2022-01-23 MED ORDER — CLOPIDOGREL BISULFATE 75 MG PO TABS: 75.0000 mg | ORAL_TABLET | Freq: Every day | ORAL | 11 refills | Status: AC

## 2022-01-23 MED ORDER — ASPIRIN 81 MG PO TABS: 81.0000 mg | ORAL_TABLET | Freq: Every day | ORAL | Status: AC

## 2022-01-23 NOTE — Progress Notes (Signed)
? ? ? ?Progress Note ? ?Patient Name: Jacob Acosta ?Date of Encounter: 01/23/2022 ? ?[facility] Cardiologist: Sanda Klein, MD  ? ?Subjective  ? ?Breathing oK   Chest a little sore   ? ?Inpatient Medications  ?  ?Scheduled Meds: ? acetaminophen  1,000 mg Oral Q6H  ? Or  ? acetaminophen (TYLENOL) oral liquid 160 mg/5 mL  1,000 mg Per Tube Q6H  ? amLODipine  2.5 mg Oral Daily  ? vitamin C  1,000 mg Oral Daily  ? aspirin EC  325 mg Oral Daily  ? Or  ? aspirin  324 mg Per Tube Daily  ? bisacodyl  10 mg Oral Daily  ? Or  ? bisacodyl  10 mg Rectal Daily  ? Chlorhexidine Gluconate Cloth  6 each Topical Daily  ? docusate sodium  200 mg Oral Daily  ? fluticasone  1 spray Each Nare BID  ? fluticasone furoate-vilanterol  1 puff Inhalation Daily  ? insulin aspart  0-24 Units Subcutaneous TID WC  ? insulin glargine-yfgn  30 Units Subcutaneous BID  ? loratadine  10 mg Oral Daily  ? metoprolol tartrate  25 mg Oral BID  ? Or  ? metoprolol tartrate  25 mg Per Tube BID  ? multivitamin with minerals  1 tablet Oral Daily  ? pantoprazole  40 mg Oral Daily  ? rosuvastatin  40 mg Oral Daily  ? ?Continuous Infusions: ? ?PRN Meds: ?albuterol, alum & mag hydroxide-simeth, dextrose, metoprolol tartrate, ondansetron (ZOFRAN) IV, traMADol  ? ?Vital Signs  ?  ?Vitals:  ? 01/22/22 2009 01/23/22 0015 01/23/22 [id number] 01/23/22 0724  ?BP: 110/73 114/68 116/79 121/72  ?Pulse: (!) 104  96 93  ?Resp: 20 17 19 18   ?Temp: 98.6 ?F (37 ?C) 98.9 ?F (37.2 ?C) 98.2 ?F (36.8 ?C) 98.1 ?F (36.7 ?C)  ?TempSrc: Oral Oral Oral Oral  ?SpO2: 98%  92% 94%  ?Weight:   101.4 kg   ?Height:      ? ? ?Intake/Output Summary (Last 24 hours) at 01/23/2022 1128 ?Last data filed at 01/22/2022 1350 ?Gross per 24 hour  ?Intake 240 ml  ?Output 400 ml  ?Net -160 ml  ? ?Last 3 Weights 01/23/2022 01/22/2022 01/21/2022  ?Weight (lbs) 223 lb 8.7 oz 230 lb 2.6 oz 230 lb 13.2 oz  ?Weight (kg) 101.4 kg 104.4 kg 104.7 kg  ?   ? ?Telemetry  ?  ?SR  - Personally Reviewed ? ?ECG  ?  ? -  Personally Reviewed ? ?Physical Exam  ? ?GEN: No acute distress.   ?Neck: No JVD ?Cardiac: RRR, no murmurs, ?Chest   INcision dry   ?Respiratory: Clear to auscultation bilaterally. ?GI: Soft, nontender, non-distended  ?MS: No edema; No deformity. ?Neuro:  Nonfocal  ?Psych: Normal affect  ? ?Labs  ?  ?High Sensitivity Troponin:   ?Recent Labs  ?Lab 01/16/22 ?1818 01/16/22 ?2035  ?TROPONINIHS 29* 77*  ?   ?Chemistry ?Recent Labs  ?Lab 01/16/22 ?1818 01/17/22 ?0217 01/19/22 ?2134 01/20/22 ?[id number] 01/20/22 ?1733 01/21/22 ?0402 01/22/22 ?0132  ?NA 137   < > 138 138 135 132* 133*  ?K 4.4   < > 4.5 4.5 4.1 4.2 3.7  ?CL 101   < > 111 109 103 98 101  ?CO2 29   < > 22 22 23 25 26   ?GLUCOSE 461*   < > 152* 207* 195* 193* 202*  ?BUN 21   < > 11 11 11 13 12   ?CREATININE 1.03   < > 0.89  0.97 0.88 0.94 0.80  ?CALCIUM 9.1   < > 8.2* 8.1* 8.5* 8.6* 8.4*  ?MG  --   --  2.7* 2.2 1.8  --   --   ?PROT 7.2  --   --   --   --   --   --   ?ALBUMIN 4.0  --   --   --   --   --   --   ?AST 20  --   --   --   --   --   --   ?ALT 28  --   --   --   --   --   --   ?ALKPHOS 83  --   --   --   --   --   --   ?BILITOT 0.3  --   --   --   --   --   --   ?GFRNONAA >60   < > >60 >60 >60 >60 >60  ?ANIONGAP 7   < > 5 7 9 9 6   ? < > = values in this interval not displayed.  ?  ?Lipids  ?Recent Labs  ?Lab 01/17/22 ?0217  ?CHOL 199  ?TRIG 63  ?HDL 47  ?LDLCALC 139*  ?CHOLHDL 4.2  ?  ?Hematology ?Recent Labs  ?Lab 01/20/22 ?1733 01/21/22 ?0402 01/22/22 ?0132  ?WBC 12.7* 12.5* 10.6*  ?RBC 3.77* 3.66* 3.54*  ?HGB 11.1* 10.6* 10.2*  ?HCT 33.2* 32.9* 30.7*  ?MCV 88.1 89.9 86.7  ?MCH 29.4 29.0 28.8  ?MCHC 33.4 32.2 33.2  ?RDW 13.1 13.2 13.1  ?PLT 176 171 172  ? ?Thyroid  ?Recent Labs  ?Lab 01/17/22 ?0217  ?TSH 1.169  ?  ?BNPNo results for input(s): BNP, PROBNP in the last 168 hours.  ?DDimer No results for input(s): DDIMER in the last 168 hours.  ? ?Radiology  ?  ?No results found. ? ?Cardiac Studies  ?01/18/22 ? ?Critical distal LMCA stenosis with 90% eccentric  narrowing and TIMI-1 flow in the distal LAD (also supplied by right-to-left collaterals). ?Moderate-severe LCx and RCA disease with 60% mid/distal LCx and 70% proximal RCA lesions. ?Normal left ventricular systolic function (LVEF [id number]) with mildly elevated filling pressure (LVEDP 20-25 mmHg). ?  ?Recommendations: ?Cardiac surgery consultation for CABG; transfer to [place] for close monitoring pending surgical evaluation. ?Restart heparin infusion 2 hours after TR band removal. ?Aggressive secondary prevention. ?  ?Nelva Bush, MD ?[place] ? ?Patient Profile  ?   ?61 y.o. male Hx of DM2, HL,  Presented to [place] with [place] / NSTEMI on 01/18/31.  Found to had LM dz    Now s/p CABG ? ?Assessment & Plan  ?  ?1  CAD   Pt now 4 days post CABG     Plans to d/c home    WIll make sure that patient has appt in f/u. ? ?2  HTN  BP ok on current regimen  ? ?3  HL  Continue on crestor    ? ? ? ? ?For questions or updates, please contact [place] ?Please consult [email or web address] for contact info under  ? ?  ?   ?Signed, ?Dorris Carnes, MD  ?01/23/2022, 11:28 AM   ? ?

## 2022-01-23 NOTE — Progress Notes (Signed)
? ?   ?  [address] ?      [name] [address] ?            [phone]   ? ?  ?4 Days Post-Op Procedure(s) (LRB): ?CORONARY ARTERY BYPASS GRAFTING (CABG), ON PUMP, TIMES THREE USING LEFT INTERNAL MAMMARY ARTERY AND RIGHT ENDOSCOPICALLY HARVESTED GREATER SAPHENOUS VEIN (N/A) ?RADIAL ARTERY HARVEST (Left) ?TRANSESOPHAGEAL ECHOCARDIOGRAM (TEE) (N/A) ? ?Subjective: ? ?Ambulated the unit at 530 this morning.  Feeling good other than some gas pain.  + ambulation  No BM yet, but is passing gas. ? ?Objective: ?Vital signs in last 24 hours: ?Temp:  [97.5 ?F (36.4 ?C)-98.9 ?F (37.2 ?C)] 98.1 ?F (36.7 ?C) (03/12 0724) ?Pulse Rate:  [93-104] 93 (03/12 0724) ?Cardiac Rhythm: Sinus tachycardia (03/11 1951) ?Resp:  [16-20] 18 (03/12 0724) ?BP: (102-122)/(68-82) 121/72 (03/12 0724) ?SpO2:  [92 %-99 %] 94 % (03/12 0724) ?Weight:  [101.4 kg] 101.4 kg (03/12 0338) ? ?Intake/Output from previous day: ?03/11 0701 - 03/12 0700 ?In: 560 [P.O.:560] ?Out: 600 [Urine:600] ? ?General appearance: alert, cooperative, and no distress ?Heart: regular rate and rhythm ?Lungs: clear to auscultation bilaterally ?Abdomen: soft, non-tender; bowel sounds normal; no masses,  no organomegaly ?Extremities: edema trace ?Wound: clean and dry ? ?Lab Results: ?Recent Labs  ?  01/21/22 ?0402 01/22/22 ?0132  ?WBC 12.5* 10.6*  ?HGB 10.6* 10.2*  ?HCT 32.9* 30.7*  ?PLT 171 172  ? ?BMET:  ?Recent Labs  ?  01/21/22 ?0402 01/22/22 ?0132  ?NA 132* 133*  ?K 4.2 3.7  ?CL 98 101  ?CO2 25 26  ?GLUCOSE 193* 202*  ?BUN 13 12  ?CREATININE 0.94 0.80  ?CALCIUM 8.6* 8.4*  ?  ?PT/INR: No results for input(s): LABPROT, INR in the last 72 hours. ?ABG ?   ?Component Value Date/Time  ? PHART 7.275 (L) 01/19/2022 2122  ? HCO3 20.5 01/19/2022 2122  ? TCO2 22 01/19/2022 2122  ? ACIDBASEDEF 6.0 (H) 01/19/2022 2122  ? O2SAT 99 01/19/2022 2122  ? ?CBG (last 3)  ?Recent Labs  ?  01/22/22 ?1644 01/22/22 ?2008 01/23/22 ?6767  ?GLUCAP 147* 178* 212*   ? ? ?Assessment/Plan: ?S/P Procedure(s) (LRB): ?CORONARY ARTERY BYPASS GRAFTING (CABG), ON PUMP, TIMES THREE USING LEFT INTERNAL MAMMARY ARTERY AND RIGHT ENDOSCOPICALLY HARVESTED GREATER SAPHENOUS VEIN (N/A) ?RADIAL ARTERY HARVEST (Left) ?TRANSESOPHAGEAL ECHOCARDIOGRAM (TEE) (N/A) ? ?CV- Sinus Tach, HR improved some- continue Lopressor, Norvasc for RA graft ?Pulm- off oxygen, no acute issues, continue IS ?Renal- weight is below baseline, will taper lasix, potassium ?GI- no BM yet, passing gas.. instructed to start Miralax at home ?DM-sugars remain slightly elevated at times, resume home regimen, may benefit from referal to nutritionist at post op visit for help with achieving better control ?Dispo- patient stable, HR improved with titration of BB, walking w/o difficulty, good BS and is passing gas add Miralax at discharge, patient is stable for d/c home today ? ? LOS: 7 days  ? ?Jacob Dandy, PA-C ?01/23/2022 ? ? ?

## 2022-01-26 MED FILL — Heparin Sodium (Porcine) Inj 1000 Unit/ML: INTRAMUSCULAR | Qty: 20 | Status: AC

## 2022-01-26 MED FILL — Sodium Chloride IV Soln 0.9%: INTRAVENOUS | Qty: 2000 | Status: AC

## 2022-01-26 MED FILL — Sodium Bicarbonate IV Soln 8.4%: INTRAVENOUS | Qty: 50 | Status: AC

## 2022-01-26 MED FILL — Mannitol IV Soln 20%: INTRAVENOUS | Qty: 500 | Status: AC

## 2022-01-26 MED FILL — Calcium Chloride Inj 10%: INTRAVENOUS | Qty: 10 | Status: AC

## 2022-01-26 MED FILL — Electrolyte-R (PH 7.4) Solution: INTRAVENOUS | Qty: 3000 | Status: AC

## 2022-01-28 ENCOUNTER — Ambulatory Visit (INDEPENDENT_AMBULATORY_CARE_PROVIDER_SITE_OTHER): Payer: Self-pay | Admitting: Thoracic Surgery (Cardiothoracic Vascular Surgery)

## 2022-01-28 ENCOUNTER — Other Ambulatory Visit: Payer: Self-pay

## 2022-01-28 DIAGNOSIS — Z951 Presence of aortocoronary bypass graft: Secondary | ICD-10-CM

## 2022-01-28 NOTE — Progress Notes (Signed)
?   ?  [address] ?      [name] [address] ?            [phone]      ? ?Patient: Home ?Provider: Office ?Consent for Telemedicine visit obtained. ? ?Today?s visit was completed via a real-time telehealth (see specific modality noted below). The patient/authorized person provided oral consent at the time of the visit to engage in a telemedicine encounter with the present provider at [facility]. The patient/authorized person was informed of the potential benefits, limitations, and risks of telemedicine. The patient/authorized person expressed understanding that the laws that protect confidentiality also apply to telemedicine. The patient/authorized person acknowledged understanding that telemedicine does not provide emergency services and that he or she would need to call 911 or proceed to the nearest hospital for help if such a need arose. ? ? Total time spent in the clinical discussion 10 minutes. ? Telehealth Modality: Phone visit (audio only) ? ?I had a telephone visit with  Jacob Acosta who is s/p CABG.  Overall doing well.   ?Pain is minimal.  Ambulating well. Vitals have been stable.  Jacob Acosta will see [place] back in 1 month with a chest x-ray for cardiac rehab clearance. ? ?Corliss Skains ? ?

## 2022-02-02 NOTE — Progress Notes (Addendum)
?Cardiology Clinic Note  ? ?Patient Name: Jacob Acosta ?Date of Encounter: 02/04/2022 ? ?Primary Care Provider:  Benita Stabile, MD ?Primary Cardiologist:  Jacob Fair, MD ? ?Patient Profile  ?  ?61 year old male patient status post hospitalization for NSTEMI admitted on 01/16/2022 through 01/23/2022.  He has known history of coronary artery disease.  He is status post CABG x3 with LIMA to LAD, left radial artery to OM1, RSVG to PDA, with endoscopic greater saphenous vein harvest on the right, open left radial artery harvest by Dr. Cliffton Acosta on 01/19/2022. Other history includes type 2 diabetes, dyslipidemia, obesity, anxiety.  On discharge he was started on Plavix due to ACS on presentation. ? ?Past Medical History  ?  ?Past Medical History:  ?Diagnosis Date  ? Anxiety   ? Diabetes mellitus without complication (HCC)   ? High cholesterol   ? ?Past Surgical History:  ?Procedure Laterality Date  ? CORONARY ARTERY BYPASS GRAFT N/A 01/19/2022  ? Procedure: CORONARY ARTERY BYPASS GRAFTING (CABG), ON PUMP, TIMES THREE USING LEFT INTERNAL MAMMARY ARTERY AND RIGHT ENDOSCOPICALLY HARVESTED GREATER SAPHENOUS VEIN;  Surgeon: Jacob Skains, MD;  Location: [facility] OR;  Service: Open Heart Surgery;  Laterality: N/A;  left radial artery harvest  ? dental sugery    ? at age 1  ? LEFT HEART CATH AND CORONARY ANGIOGRAPHY N/A 01/17/2022  ? Procedure: LEFT HEART CATH AND CORONARY ANGIOGRAPHY;  Surgeon: Jacob Kendall, MD;  Location: [facility] INVASIVE CV LAB;  Service: Cardiovascular;  Laterality: N/A;  ? RADIAL ARTERY HARVEST Left 01/19/2022  ? Procedure: RADIAL ARTERY HARVEST;  Surgeon: Jacob Skains, MD;  Location: [facility] OR;  Service: Open Heart Surgery;  Laterality: Left;  ? TEE WITHOUT CARDIOVERSION N/A 01/19/2022  ? Procedure: TRANSESOPHAGEAL ECHOCARDIOGRAM (TEE);  Surgeon: Jacob Skains, MD;  Location: [facility] OR;  Service: Open Heart Surgery;  Laterality: N/A;  ? TONSILLECTOMY  1980  ? ? ?Allergies ? ?Allergies  ?Allergen Reactions   ? Ciprofloxacin Hcl Other (See Comments)  ?  Hallucinations ?  ? Codeine Anxiety and Other (See Comments)  ?  "Causes him to feel very anxious"  ? Metformin And Related Nausea Only and Palpitations  ? Penicillins Rash  ?  Has patient had a PCN reaction causing immediate rash, facial/tongue/throat swelling, SOB or lightheadedness with hypotension: Yes ?Has patient had a PCN reaction causing severe rash involving mucus membranes or skin necrosis: No ?Has patient had a PCN reaction that required hospitalization: No ?Has patient had a PCN reaction occurring within the last 10 years: Yes ?If all of the above answers are "NO", then may proceed with Cephalosporin use. ?  ? [employer] [Insulin Glargine] Itching, Nausea Only, Palpitations and Other (See Comments)  ?  Itching throat  ? ? ?History of Present Illness  ?  ?Jacob Acosta comes today status post hospitalization after admission for NSTEMI leading to cardiac catheterization which revealed critical distal LMCA stenosis with 90% eccentric narrowing and TIMI I flow in the distal LAD supplied by right to left collaterals.  He had moderate to severe left circumflex and RCA disease with 60% mid to distal left circumflex and 70% proximal RCA lesions.  Normal left ventricular systolic function and with an LVEF of 55 to 65% with mildly elevated filling pressure, LVEDP 20 to 25 mmHg. ? ?He presented to [facility] with complaints of chest pain on and off for the previous 2 weeks which she felt was related to his allergies and he had been using  albuterol inhaler for symptoms of shortness of breath.  While inside his home he had this episode of chest pain with associated pallor, diaphoresis with radiation to his jaw.  He had elevated blood glucose of 460. ? ?He is here with his wife, Jacob Acosta, who was employed with [employer].  He comes today feeling great, he is anxious to get back to a sense of normalcy, working in his yard, become more active  again, and move forward.  He has a great attitude about all of this.  He admits to some soreness in his chest especially when he coughs, he does have some phlegm which occurs and accumulates in his pharynx.  It is clear.  He denies any significant pain, bleeding issues, dizziness, dyspnea, syncope or near syncope. ? ?He has multiple questions concerning when he can progress, go back to work, and begin exercise regimen.  He is medically compliant, and is very strict on his heart healthy diet. ? ?Home Medications  ?  ?Current Outpatient Medications  ?Medication Sig Dispense Refill  ? acetaminophen (TYLENOL) 500 MG tablet Take 1-2 tablets (500-1,000 mg total) by mouth every 6 (six) hours as needed for mild pain or fever. 30 tablet 0  ? aspirin 81 MG tablet Take 1 tablet (81 mg total) by mouth daily. 30 tablet   ? cetirizine (ZYRTEC ALLERGY) 10 MG tablet Take 1 tablet (10 mg total) by mouth daily. 30 tablet 2  ? clopidogrel (PLAVIX) 75 MG tablet Take 1 tablet (75 mg total) by mouth daily. 30 tablet 11  ? fluticasone (FLONASE) 50 MCG/ACT nasal spray Place 1 spray into both nostrils 2 (two) times daily. 16 g 2  ? hydroxypropyl methylcellulose / hypromellose (ISOPTO TEARS / GONIOVISC) 2.5 % ophthalmic solution Place 1 drop into both eyes 2 (two) times daily as needed for dry eyes.    ? Insulin Glargine (BASAGLAR KWIKPEN) 100 UNIT/ML Inject 30 Units into the skin in the morning and at bedtime.    ? KRILL OIL PO Take 1 capsule by mouth daily.    ? metoprolol succinate (TOPROL-XL) 50 MG 24 hr tablet Take 1 tablet (50 mg total) by mouth daily. Take with or immediately following a meal. At night 90 tablet 3  ? Multiple Vitamins-Minerals (CENTRUM SILVER 50+MEN PO) Take 1 tablet by mouth daily.    ? rosuvastatin (CRESTOR) 40 MG tablet Take 1 tablet (40 mg total) by mouth daily. 30 tablet 3  ? vitamin C (ASCORBIC ACID) 500 MG tablet Take 1,000 mg by mouth daily.    ? amLODipine (NORVASC) 2.5 MG tablet Take 1 tablet (2.5 mg total)  by mouth daily. 90 tablet 3  ? ?No current facility-administered medications for this visit.  ?  ? ?Family History  ?  ?Family History  ?Problem Relation Age of Onset  ? Anxiety disorder Mother   ? Depression Father   ? Alcohol abuse Father   ? ?He indicated that his mother is deceased. He indicated that his father is deceased. He indicated that the status of his maternal uncle is unknown and reported the following: DM  x 3. ? ?Social History  ?  ?Social History  ? ?Socioeconomic History  ? Marital status: Married  ?  Spouse name: kay Buechele  ? Number of children: 0  ? Years of education: Not on file  ? Highest education level: Not on file  ?Occupational History  ? Occupation: drives delivery truck  ?Tobacco Use  ? Smoking status: Former  ?  Types: Cigarettes  ? Smokeless tobacco: Never  ? Tobacco comments:  ?  only smoked occasionally  ?Vaping Use  ? Vaping Use: Never used  ?Substance and Sexual Activity  ? Alcohol use: No  ? Drug use: No  ? Sexual activity: Not Currently  ?Other Topics Concern  ? Not on file  ?Social History Narrative  ? Not on file  ? ?Social Determinants of Health  ? ?Financial Resource Strain: Not on file  ?Food Insecurity: Not on file  ?Transportation Needs: Not on file  ?Physical Activity: Not on file  ?Stress: Not on file  ?Social Connections: Not on file  ?Intimate Partner Violence: Not on file  ?  ? ?Review of Systems  ?  ?General:  No chills, fever, night sweats or weight changes.  ?Cardiovascular:  No chest pain, dyspnea on exertion, edema, orthopnea, palpitations, paroxysmal nocturnal dyspnea.  He does have some mild discomfort in his chest postoperatively. ?Dermatological: No rash, lesions/masses ?Respiratory: No cough, dyspnea ?Urologic: No hematuria, dysuria ?Abdominal:   No nausea, vomiting, diarrhea, bright red blood per rectum, melena, or hematemesis ?Neurologic:  No visual changes, wkns, changes in mental status. ?All other systems reviewed and are otherwise negative except as  noted above. ? ? ? ?Cardiac Rehabilitation Eligibility Assessment  ?The patient is ready to start cardiac rehabilitation pending clearance from the cardiac surgeon. ?  ? ?Physical Exam  ?  ?VS:  BP 11

## 2022-02-04 ENCOUNTER — Ambulatory Visit (INDEPENDENT_AMBULATORY_CARE_PROVIDER_SITE_OTHER): Payer: 59 | Admitting: Adult Health

## 2022-02-04 ENCOUNTER — Other Ambulatory Visit: Payer: Self-pay

## 2022-02-04 ENCOUNTER — Encounter: Payer: Self-pay | Admitting: Adult Health

## 2022-02-04 VITALS — BP 118/74 | HR 65 | Ht 66.0 in | Wt 210.8 lb

## 2022-02-04 DIAGNOSIS — E118 Type 2 diabetes mellitus with unspecified complications: Secondary | ICD-10-CM | POA: Diagnosis not present

## 2022-02-04 DIAGNOSIS — I214 Non-ST elevation (NSTEMI) myocardial infarction: Secondary | ICD-10-CM | POA: Diagnosis not present

## 2022-02-04 DIAGNOSIS — I10 Essential (primary) hypertension: Secondary | ICD-10-CM

## 2022-02-04 DIAGNOSIS — E7800 Pure hypercholesterolemia, unspecified: Secondary | ICD-10-CM

## 2022-02-04 DIAGNOSIS — Z951 Presence of aortocoronary bypass graft: Secondary | ICD-10-CM

## 2022-02-04 MED ORDER — AMLODIPINE BESYLATE 2.5 MG PO TABS: 2.5000 mg | ORAL_TABLET | Freq: Every day | ORAL | 3 refills | Status: DC

## 2022-02-04 MED ORDER — METOPROLOL SUCCINATE ER 50 MG PO TB24: 50.0000 mg | ORAL_TABLET | Freq: Every day | ORAL | 3 refills | Status: DC

## 2022-02-04 NOTE — Patient Instructions (Signed)
Medication Instructions:  ?Start Metoprolol succinate 50 mg ( Take 1 Tablet in The Evening). ?*If you need a refill on your cardiac medications before your next appointment, please call your pharmacy* ? ? ?Lab Work: ?No labs ?If you have labs (blood work) drawn today and your tests are completely normal, you will receive your results only by: ?MyChart Message (if you have MyChart) OR ?A paper copy in the mail ?If you have any lab test that is abnormal or we need to change your treatment, we will call you to review the results. ? ? ?Testing/Procedures: ?No testing ? ? ?Follow-Up: ?At [facility], you and your health needs are our priority.  As part of our continuing mission to provide you with exceptional heart care, we have created designated Provider Care Teams.  These Care Teams include your primary Cardiologist (physician) and Advanced Practice Providers (APPs -  Physician Assistants and Nurse Practitioners) who all work together to provide you with the care you need, when you need it. ? ?We recommend signing up for the patient portal called "MyChart".  Sign up information is provided on this After Visit Summary.  MyChart is used to connect with patients for Virtual Visits (Telemedicine).  Patients are able to view lab/test results, encounter notes, upcoming appointments, etc.  Non-urgent messages can be sent to your provider as well.   ?To learn more about what you can do with MyChart, go to [email or web address].   ? ?Your next appointment:   ?6 week(s) ? ?The format for your next appointment:   ?In Person ? ?Provider:   ?Thurmon Fair, MD   ? ? ?  ?

## 2022-02-10 ENCOUNTER — Telehealth: Payer: Self-pay | Admitting: Adult Health

## 2022-02-10 MED ORDER — METOPROLOL TARTRATE 25 MG PO TABS: 25.0000 mg | ORAL_TABLET | Freq: Two times a day (BID) | ORAL | 3 refills | Status: DC

## 2022-02-10 NOTE — Telephone Encounter (Signed)
Spoke with pt's wife and pt did not tolerate the long acting Metoprolol made him nauseated Pt resumed the tartrate at 25 mg bid  still has some fatigue but has improved Will forward to Joni Reining NP for review .cy ?

## 2022-02-10 NOTE — Telephone Encounter (Signed)
Pt c/o medication issue: ? ?1. Name of Medication: metoprolol succinate (TOPROL-XL) 50 MG 24 hr tablet ? ?2. How are you currently taking this medication (dosage and times per day)? Not taking ? ?3. Are you having a reaction (difficulty breathing--STAT)? Yes ? ?4. What is your medication issue? Spoke to patient's wife, [name], she states that they saw Joni Reining, NP last Friday 02/03/22 and she started Mr. Nunziata on Metoprolol Succinate 50mg  1x daily. Mrs. Cortopassi states that this medication makes him nauseous so he has stopped taking this and went back to taking Metoprolol Tartrate 25mg  2x daily.   ?

## 2022-02-28 ENCOUNTER — Other Ambulatory Visit: Payer: Self-pay | Admitting: Thoracic Surgery (Cardiothoracic Vascular Surgery)

## 2022-02-28 DIAGNOSIS — I25119 Atherosclerotic heart disease of native coronary artery with unspecified angina pectoris: Secondary | ICD-10-CM

## 2022-03-01 ENCOUNTER — Ambulatory Visit
Admission: RE | Admit: 2022-03-01 | Discharge: 2022-03-01 | Disposition: A | Discharge status: Home / Self Care | Payer: 59 | Source: Ambulatory Visit | Attending: Thoracic Surgery (Cardiothoracic Vascular Surgery) | Admitting: Thoracic Surgery (Cardiothoracic Vascular Surgery)

## 2022-03-01 ENCOUNTER — Ambulatory Visit (INDEPENDENT_AMBULATORY_CARE_PROVIDER_SITE_OTHER): Payer: Self-pay | Admitting: Surgical

## 2022-03-01 VITALS — BP 143/84 | HR 73 | Resp 20 | Ht 66.0 in | Wt 217.0 lb

## 2022-03-01 DIAGNOSIS — Z951 Presence of aortocoronary bypass graft: Secondary | ICD-10-CM | POA: Diagnosis not present

## 2022-03-01 DIAGNOSIS — I25119 Atherosclerotic heart disease of native coronary artery with unspecified angina pectoris: Secondary | ICD-10-CM

## 2022-03-01 DIAGNOSIS — I2510 Atherosclerotic heart disease of native coronary artery without angina pectoris: Secondary | ICD-10-CM | POA: Diagnosis not present

## 2022-03-01 NOTE — Patient Instructions (Signed)
Activity progression as discussed including lifting and driving. ?

## 2022-03-01 NOTE — Progress Notes (Signed)
? ?   ?[address] ?      [place] [address] ?            [phone]   ? ?  ?Marcie Bal ?[place] Record [id number] ?Date of Birth: October 21, 1961 ? ?Referring: Sanda Klein, MD ?Primary Care: Celene Squibb, MD ?Primary Cardiologist: Sanda Klein, MD ? ? ?Chief Complaint:   POST OP FOLLOW UP ?01/19/2022 ?Patient:  Jacob Acosta ?Pre-Op Dx: LM CAD ?NSTEMI ?HTN ?HLP   ?Post-op Dx:  same ?Procedure: ?CABG X 3.  LIMA LAD.  Left radial artery to OM1, RSVG PDA   ?Endoscopic greater saphenous vein harvest on the right ?Open left radial artery harvest ?  ?  ?Surgeon and Role:   ?   * Lajuana Matte, MD - Primary ?   Josie Saunders, PA-C - assisting ?History of Present Illness:   The patient is a 61 year old male status post above described procedure seen in the office on today's date and routine postsurgical follow-up.  He reports that he is doing well.  He is ambulating daily.  He is not having any significant pain.  Occasional pain mid uses only Tylenol.  Denies chest pain or anginal equivalents.  He is not having shortness of breath.  He is not having palpitations.  Not having lower extremity edema.  He denies fevers, chills or other significant constitutional symptoms overall the patient is quite pleased with his general progress. ? ? ? ?  ?Past Medical History:  ?Diagnosis Date  ? Anxiety   ? Diabetes mellitus without complication ([place])   ? High cholesterol   ? ? ? ?Social History  ? ?Tobacco Use  ?Smoking Status Former  ? Types: Cigarettes  ?Smokeless Tobacco Never  ?Tobacco Comments  ? only smoked occasionally  ?  ?Social History  ? ?Substance and Sexual Activity  ?Alcohol Use No  ? ? ? ?Allergies  ?Allergen Reactions  ? Ciprofloxacin Hcl Other (See Comments)  ?  Hallucinations ?  ? Codeine Anxiety and Other (See Comments)  ?  "Causes him to feel very anxious"  ? Metformin And Related Nausea Only and Palpitations  ? Penicillins Rash  ?  Has patient had a PCN reaction causing  immediate rash, facial/tongue/throat swelling, SOB or lightheadedness with hypotension: Yes ?Has patient had a PCN reaction causing severe rash involving mucus membranes or skin necrosis: No ?Has patient had a PCN reaction that required hospitalization: No ?Has patient had a PCN reaction occurring within the last 10 years: Yes ?If all of the above answers are "NO", then may proceed with Cephalosporin use. ?  ? Toujeo [employer] [Insulin Glargine] Itching, Nausea Only, Palpitations and Other (See Comments)  ?  Itching throat  ? ? ?Current Outpatient Medications  ?Medication Sig Dispense Refill  ? acetaminophen (TYLENOL) 500 MG tablet Take 1-2 tablets (500-1,000 mg total) by mouth every 6 (six) hours as needed for mild pain or fever. 30 tablet 0  ? amLODipine (NORVASC) 2.5 MG tablet Take 1 tablet (2.5 mg total) by mouth daily. 90 tablet 3  ? aspirin 81 MG tablet Take 1 tablet (81 mg total) by mouth daily. 30 tablet   ? clopidogrel (PLAVIX) 75 MG tablet Take 1 tablet (75 mg total) by mouth daily. 30 tablet 11  ? hydroxypropyl methylcellulose / hypromellose (ISOPTO TEARS / GONIOVISC) 2.5 % ophthalmic solution Place 1 drop into both eyes 2 (two) times daily as needed for dry eyes.    ?  Insulin Glargine (BASAGLAR KWIKPEN) 100 UNIT/ML Inject 30 Units into the skin in the morning and at bedtime.    ? metoprolol tartrate (LOPRESSOR) 25 MG tablet Take 1 tablet (25 mg total) by mouth 2 (two) times daily. 180 tablet 3  ? Multiple Vitamins-Minerals (CENTRUM SILVER 50+MEN PO) Take 1 tablet by mouth daily.    ? rosuvastatin (CRESTOR) 40 MG tablet Take 1 tablet (40 mg total) by mouth daily. 30 tablet 3  ? vitamin C (ASCORBIC ACID) 500 MG tablet Take 1,000 mg by mouth daily.    ? cetirizine (ZYRTEC ALLERGY) 10 MG tablet Take 1 tablet (10 mg total) by mouth daily. (Patient not taking: Reported on 03/01/2022) 30 tablet 2  ? fluticasone (FLONASE) 50 MCG/ACT nasal spray Place 1 spray into both nostrils 2 (two) times daily. (Patient not  taking: Reported on 03/01/2022) 16 g 2  ? KRILL OIL PO Take 1 capsule by mouth daily. (Patient not taking: Reported on 03/01/2022)    ? ?No current facility-administered medications for this visit.  ? ? ? ? ? ?Physical Exam: ?BP (!) 143/84 (BP Location: Right Arm, Patient Position: Sitting)   Pulse 73   Resp 20   Ht 5\' 6"  (1.676 m)   Wt 217 lb (98.4 kg)   SpO2 97% Comment: RA  BMI 35.02 kg/m?  ? ?General appearance: alert, cooperative, and no distress ?Heart: regular rate and rhythm ?Lungs: clear to auscultation bilaterally ?Abdomen: Benign ?Extremities: No edema ?Wound: Incisions healing well without evidence of infection. ? ? ?Diagnostic Studies & Laboratory data: ?   ? Recent Radiology Findings:  ? DG Chest 2 View ? ?Result Date: 03/01/2022 ?CLINICAL DATA:  CAD EXAM: CHEST - 2 VIEW COMPARISON:  01/21/2022 FINDINGS: Interval removal of right IJ catheter. Status post median sternotomy and CABG. Normal cardiac and mediastinal contours. No focal pulmonary opacity; previously noted left basilar opacity is no longer seen. No pleural effusion or pneumothorax. No acute osseous abnormality. IMPRESSION: No active cardiopulmonary disease. Electronically Signed   By: Merilyn Baba M.D.   On: 03/01/2022 13:37   ?  ? ?Recent Lab Findings: ?Lab Results  ?Component Value Date  ? WBC 10.6 (H) 01/22/2022  ? HGB 10.2 (L) 01/22/2022  ? HCT 30.7 (L) 01/22/2022  ? PLT 172 01/22/2022  ? GLUCOSE 202 (H) 01/22/2022  ? CHOL 199 01/17/2022  ? TRIG 63 01/17/2022  ? HDL 47 01/17/2022  ? LDLDIRECT 153.4 01/28/2013  ? LDLCALC 139 (H) 01/17/2022  ? ALT 28 01/16/2022  ? AST 20 01/16/2022  ? NA 133 (L) 01/22/2022  ? K 3.7 01/22/2022  ? CL 101 01/22/2022  ? CREATININE 0.80 01/22/2022  ? BUN 12 01/22/2022  ? CO2 26 01/22/2022  ? TSH 1.169 01/17/2022  ? INR 1.3 (H) 01/19/2022  ? HGBA1C 9.1 (H) 01/17/2022  ? ? ? ? ?Assessment / Plan: Patient is doing very well in his postsurgical recovery.  We discussed activity progression per routine including  lifting and driving.  I reviewed his chest x-ray.  He looks good.  I did not make any changes to his current medication regimen.  He did show me his blood pressure record and typically they are much lower than as noted in today's office visit. ? ?We will see the patient again on a as needed basis for any surgical needs or at her request. ?   ? ? ?Medication Changes: ?No orders of the defined types were placed in this encounter. ? ? ? ? ?John Giovanni, PA-C  ?  03/01/2022 1:58 PM ? ? ? ? ? ?  ?

## 2022-03-15 DIAGNOSIS — E119 Type 2 diabetes mellitus without complications: Secondary | ICD-10-CM | POA: Diagnosis not present

## 2022-03-15 DIAGNOSIS — Z125 Encounter for screening for malignant neoplasm of prostate: Secondary | ICD-10-CM | POA: Diagnosis not present

## 2022-03-16 ENCOUNTER — Ambulatory Visit (INDEPENDENT_AMBULATORY_CARE_PROVIDER_SITE_OTHER): Payer: 59 | Admitting: Cardiovascular Disease

## 2022-03-16 ENCOUNTER — Encounter: Payer: Self-pay | Admitting: Cardiovascular Disease

## 2022-03-16 VITALS — BP 116/66 | HR 70 | Ht 66.0 in | Wt 224.6 lb

## 2022-03-16 DIAGNOSIS — I2510 Atherosclerotic heart disease of native coronary artery without angina pectoris: Secondary | ICD-10-CM | POA: Diagnosis not present

## 2022-03-16 DIAGNOSIS — E7800 Pure hypercholesterolemia, unspecified: Secondary | ICD-10-CM

## 2022-03-16 DIAGNOSIS — E118 Type 2 diabetes mellitus with unspecified complications: Secondary | ICD-10-CM | POA: Diagnosis not present

## 2022-03-16 NOTE — Patient Instructions (Signed)
Medication Instructions:  ?No changes ?*If you need a refill on your cardiac medications before your next appointment, please call your pharmacy* ? ? ?Lab Work: ?None ordered ?If you have labs (blood work) drawn today and your tests are completely normal, you will receive your results only by: ?MyChart Message (if you have MyChart) OR ?A paper copy in the mail ?If you have any lab test that is abnormal or we need to change your treatment, we will call you to review the results. ? ? ?Testing/Procedures: ?None ordered ? ? ?Follow-Up: ?At [facility], you and your health needs are our priority.  As part of our continuing mission to provide you with exceptional heart care, we have created designated Provider Care Teams.  These Care Teams include your primary Cardiologist (physician) and Advanced Practice Providers (APPs -  Physician Assistants and Nurse Practitioners) who all work together to provide you with the care you need, when you need it. ? ?We recommend signing up for the patient portal called "MyChart".  Sign up information is provided on this After Visit Summary.  MyChart is used to connect with patients for Virtual Visits (Telemedicine).  Patients are able to view lab/test results, encounter notes, upcoming appointments, etc.  Non-urgent messages can be sent to your provider as well.   ?To learn more about what you can do with MyChart, go to [email or web address].   ? ?Your next appointment:   ?4 month(s) ? ?The format for your next appointment:   ?In Person ? ?Provider:   ?Thurmon Fair, MD { ? ? ?

## 2022-03-16 NOTE — Progress Notes (Signed)
?Cardiology Office Note:   ? ?Date:  03/16/2022  ? ?ID:  Jacob Acosta, DOB May 22, 1961, MRN [id number] ? ?PCP:  Jacob Stabile, MD  ?Cardiologist:  Jacob Fair, MD   ? ?Referring MD: Jacob Stabile, MD  ? ?No chief complaint on file. ?Palpitations, coronary risk factors ? ?History of Present Illness:   ? ?Jacob Acosta is a 61 y.o. male with a hx of CAD s/p CABG (LIMA-LAD, radial-OM1, SVG-R PDA, Dr. Cliffton Acosta 01/19/2022) after NSTEMI with 90% distal left main stenosis by cath 01/17/2022, hypercholesterolemia, poorly controlled type 2 diabetes mellitus, mild obesity, generalized anxiety disorder. ? ?[name] has made important changes to his lifestyle.  He is exercising daily and is eager to increase the intensity of physical exercise.  He is watching his diet carefully and avoiding red meat.  His weight initially decreased after bypass surgery but is trending slightly upwards now.  He had labs performed just yesterday with Dr. Margo Acosta, but those results are not yet available.  At the time of his presentation with non-STEMI his hemoglobin A1c was 9.1% and his LDL cholesterol was 139.  He is currently on Lantus insulin for glucose control and has occasional nocturnal mild hypoglycemia.  He is taking maximum dose rosuvastatin without side effects.  He is on a low-dose of metoprolol and amlodipine with good blood pressure control.  Since he presented with acute coronary syndrome, he is taking both aspirin and clopidogrel through March of next year. ? ?Past Medical History:  ?Diagnosis Date  ? Anxiety   ? Diabetes mellitus without complication ([facility])   ? High cholesterol   ? ? ?Past Surgical History:  ?Procedure Laterality Date  ? CORONARY ARTERY BYPASS GRAFT N/A 01/19/2022  ? Procedure: CORONARY ARTERY BYPASS GRAFTING (CABG), ON PUMP, TIMES THREE USING LEFT INTERNAL MAMMARY ARTERY AND RIGHT ENDOSCOPICALLY HARVESTED GREATER SAPHENOUS VEIN;  Surgeon: Jacob Skains, MD;  Location: [facility] OR;  Service: Open Heart Surgery;   Laterality: N/A;  left radial artery harvest  ? dental sugery    ? at age 32  ? LEFT HEART CATH AND CORONARY ANGIOGRAPHY N/A 01/17/2022  ? Procedure: LEFT HEART CATH AND CORONARY ANGIOGRAPHY;  Surgeon: Jacob Kendall, MD;  Location: [facility] INVASIVE CV LAB;  Service: Cardiovascular;  Laterality: N/A;  ? RADIAL ARTERY HARVEST Left 01/19/2022  ? Procedure: RADIAL ARTERY HARVEST;  Surgeon: Jacob Skains, MD;  Location: [facility] OR;  Service: Open Heart Surgery;  Laterality: Left;  ? TEE WITHOUT CARDIOVERSION N/A 01/19/2022  ? Procedure: TRANSESOPHAGEAL ECHOCARDIOGRAM (TEE);  Surgeon: Jacob Skains, MD;  [facility];  Service: Open Heart Surgery;  Laterality: N/A;  ? TONSILLECTOMY  1980  ? ? ?Current Medications: ?Current Meds  ?Medication Sig  ? amLODipine (NORVASC) 2.5 MG tablet Take 1 tablet (2.5 mg total) by mouth daily.  ? clopidogrel (PLAVIX) 75 MG tablet Take 1 tablet (75 mg total) by mouth daily.  ? Insulin Glargine (BASAGLAR KWIKPEN) 100 UNIT/ML Inject 30 Units into the skin in the morning and at bedtime.  ? metoprolol tartrate (LOPRESSOR) 25 MG tablet Take 1 tablet (25 mg total) by mouth 2 (two) times daily.  ? rosuvastatin (CRESTOR) 40 MG tablet Take 1 tablet (40 mg total) by mouth daily.  ?  ? ?Allergies:   Ciprofloxacin hcl, Codeine, Metformin and related, Penicillins, and Toujeo max [employer] [insulin glargine]  ? ?Social History  ? ?Socioeconomic History  ? Marital status: Married  ?  Spouse name: Jacob Acosta  ? Number of children:  0  ? Years of education: Not on file  ? Highest education level: Not on file  ?Occupational History  ? Occupation: drives delivery truck  ?Tobacco Use  ? Smoking status: Former  ?  Types: Cigarettes  ? Smokeless tobacco: Never  ? Tobacco comments:  ?  only smoked occasionally  ?Vaping Use  ? Vaping Use: Never used  ?Substance and Sexual Activity  ? Alcohol use: No  ? Drug use: No  ? Sexual activity: Not Currently  ?Other Topics Concern  ? Not on file  ?Social History  Narrative  ? Not on file  ? ?Social Determinants of Health  ? ?Financial Resource Strain: Not on file  ?Food Insecurity: Not on file  ?Transportation Needs: Not on file  ?Physical Activity: Not on file  ?Stress: Not on file  ?Social Connections: Not on file  ?  ? ?Family History: ?The patient's family history includes Alcohol abuse in his father; Anxiety disorder in his mother; Depression in his father. ?ROS:   ?Please see the history of present illness.    ? All other systems reviewed and are negative. ? ?EKGs/Labs/Other Studies Reviewed:   ? ?Cardiac catheterization 01/17/2022 ?Critical distal LMCA stenosis with 90% eccentric narrowing and TIMI-1 flow in the distal LAD (also supplied by right-to-left collaterals). ?Moderate-severe LCx and RCA disease with 60% mid/distal LCx and 70% proximal RCA lesions. ?Normal left ventricular systolic function (LVEF 55-65%) with mildly elevated filling pressure (LVEDP 20-25 mmHg). ?  ?Recommendations: ?Cardiac surgery consultation for CABG; transfer to 2H for close monitoring pending surgical evaluation. ?Restart heparin infusion 2 hours after TR band removal. ?Aggressive secondary prevention. ?  ?Dominance: Right ? ? ? ?Comparison(s): No prior Echocardiogram.  ?Echocardiogram 01/18/2022: ? ? 1. Left ventricular ejection fraction, by estimation, is 60 to 65%. Left  ?ventricular ejection fraction by 3D volume is 60 %. The left ventricle has  ?normal function. The left ventricle has no regional wall motion  ?abnormalities. There is mild left  ?ventricular hypertrophy. Left ventricular diastolic parameters are  ?consistent with Grade I diastolic dysfunction (impaired relaxation).  ? 2. Right ventricular systolic function is normal. The right ventricular  ?size is normal.  ? 3. The mitral valve is grossly normal. Trivial mitral valve  ?regurgitation.  ? 4. The aortic valve is tricuspid. Aortic valve regurgitation is not  ?visualized.  ? 5. Aortic dilatation noted. 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  ? 6. The inferior vena cava is dilated in size with >50% respiratory  ?variability, suggesting right atrial pressure of 8 mmHg.  ? ?Comparison(s): No prior Echocardiogram.  ? EKG:  EKG is ordered today.  The ekg ordered today demonstrates Sinus rhythm, normal tracing, QTC 420 ms ? ?Recent Labs: ?01/16/2022: ALT 28 ?01/17/2022: TSH 1.169 ?01/20/2022: Magnesium 1.8 ?01/22/2022: BUN 12; Creatinine, Ser 0.80; Hemoglobin 10.2; Platelets 172; Potassium 3.7; Sodium 133  ?Recent Lipid Panel ?   ?Component Value Date/Time  ? CHOL 199 01/17/2022 0217  ? TRIG 63 01/17/2022 0217  ? HDL 47 01/17/2022 0217  ? CHOLHDL 4.2 01/17/2022 0217  ? VLDL 13 01/17/2022 0217  ? LDLCALC 139 (H) 01/17/2022 0217  ? LDLDIRECT 153.4 01/28/2013 1243  ? ? ?Physical Exam:   ? ?VS:  BP 116/66 (BP Location: Left Arm, Patient Position: Sitting, Cuff Size: Large)   Pulse 70   Ht 5\' 6"  (1.676 m)   Wt 224 lb 9.6 oz (101.9 kg)   SpO2 97%   BMI 36.25 kg/m?    ? ?  Wt Readings from Last 3 Encounters:  ?03/16/22 224 lb 9.6 oz (101.9 kg)  ?03/01/22 217 lb (98.4 kg)  ?02/04/22 210 lb 12.8 oz (95.6 kg)  ?  ? ?General: Alert, oriented x3, no distress, appears very positive.  Well-healed scars of sternotomy, radial artery and saphenous vein graft harvest sites, chest tube sites. ?Head: no evidence of trauma, PERRL, EOMI, no exophtalmos or lid lag, no myxedema, no xanthelasma; normal ears, nose and oropharynx ?Neck: normal jugular venous pulsations and no hepatojugular reflux; brisk carotid pulses without delay and no carotid bruits ?Chest: clear to auscultation, no signs of consolidation by percussion or palpation, normal fremitus, symmetrical and full respiratory excursions ?Cardiovascular: normal position and quality of the apical impulse, regular rhythm, normal first and second heart sounds, no murmurs, rubs or gallops ?Abdomen: no tenderness or distention, no masses by palpation, no abnormal pulsatility or  arterial bruits, normal bowel sounds, no hepatosplenomegaly ?Extremities: no clubbing, cyanosis or edema; 2+ radial, ulnar and brachial pulses bilaterally; 2+ right femoral, posterior tibial and dorsalis pedis pulses; 2+ left femo

## 2022-04-06 ENCOUNTER — Telehealth: Payer: Self-pay | Admitting: *Deleted

## 2022-04-06 NOTE — Telephone Encounter (Signed)
Attempted to reach the patient. Was unable to leave a message  Per Dr. Royann Shivers: Croitoru, Rachelle Hora, MD  Sandi Mariscal, RN Labs from PCP show that LDL is still too high (over 70) despite maximum dose rosuvastatin.  Recommend adding zetia 10 mg daily

## 2022-04-19 NOTE — Telephone Encounter (Signed)
Attempted to reach the patient. Was unable to leave a message 

## 2022-04-21 NOTE — Telephone Encounter (Signed)
Spoke with the patient. He stated that his LDL was 79. He would like to hold off on adding Zetia for the time being and keep working on his diet and exercising. Next appointment with Dr. Sallyanne Kuster is in September.

## 2022-04-22 MED ORDER — EZETIMIBE 10 MG PO TABS: 10.0000 mg | ORAL_TABLET | Freq: Every day | ORAL | 3 refills | Status: DC

## 2022-04-22 NOTE — Addendum Note (Signed)
Addended by: Sandi Mariscal on: 04/22/2022 08:16 AM   Modules accepted: Orders

## 2022-04-22 NOTE — Telephone Encounter (Signed)
Message received that the patient changed his mind. He would like to take Zetia. This has been sent in for him.

## 2022-05-02 ENCOUNTER — Other Ambulatory Visit: Payer: Self-pay | Admitting: Physician Assistant

## 2022-05-04 ENCOUNTER — Other Ambulatory Visit: Payer: Self-pay | Admitting: Physician Assistant

## 2022-05-04 ENCOUNTER — Ambulatory Visit
Admission: EM | Admit: 2022-05-04 | Discharge: 2022-05-04 | Disposition: A | Discharge status: Home / Self Care | Payer: 59 | Attending: Family Medicine | Admitting: Family Medicine

## 2022-05-04 DIAGNOSIS — R300 Dysuria: Secondary | ICD-10-CM

## 2022-05-04 DIAGNOSIS — R051 Acute cough: Secondary | ICD-10-CM | POA: Diagnosis not present

## 2022-05-04 DIAGNOSIS — Z87438 Personal history of other diseases of male genital organs: Secondary | ICD-10-CM

## 2022-05-04 DIAGNOSIS — R0981 Nasal congestion: Secondary | ICD-10-CM | POA: Diagnosis not present

## 2022-05-04 LAB — POCT URINALYSIS DIP (MANUAL ENTRY)
Bilirubin, UA: NEGATIVE
Blood, UA: NEGATIVE
Glucose, UA: 100 mg/dL — AB
Leukocytes, UA: NEGATIVE
Nitrite, UA: NEGATIVE
Spec Grav, UA: 1.025 (ref 1.010–1.025)
Urobilinogen, UA: 0.2 E.U./dL
pH, UA: 5 (ref 5.0–8.0)

## 2022-05-04 MED ORDER — CEPHALEXIN 500 MG PO CAPS: 500.0000 mg | ORAL_CAPSULE | Freq: Two times a day (BID) | ORAL | 0 refills | Status: DC

## 2022-05-04 MED ORDER — ALBUTEROL SULFATE HFA 108 (90 BASE) MCG/ACT IN AERS: 1.0000 | INHALATION_SPRAY | Freq: Four times a day (QID) | RESPIRATORY_TRACT | 0 refills | Status: DC | PRN

## 2022-05-04 NOTE — ED Provider Notes (Signed)
[facility]  CSN: [id number] Arrival date & time: 05/04/22  0809      History   Chief Complaint Chief Complaint  Patient presents with   chest congestion    Dysuria    HPI Jacob Acosta is a 61 y.o. male.   Patient presenting today with 1 week history of productive cough, chest congestion, nasal congestion.  Denies chest pain, shortness of breath, abdominal pain, nausea vomiting diarrhea, fevers.  So far not trying thing over-the-counter for symptoms.  Has history of seasonal allergies not currently on any medications for this as he recently had heart surgery and was concerned the medications may interfere.  Has also been having dysuria for the last few days.  Has this episodically, typically resolved with a course of Keflex or similar medication.  Denies any fever, chills, hematuria, flank pain, abdominal pain, nausea vomiting or diarrhea.   Past Medical History:  Diagnosis Date   Anxiety    Diabetes mellitus without complication (HCC)    High cholesterol     Patient Active Problem List   Diagnosis Date Noted   Severe obesity (BMI 35.0-39.9) with comorbidity (HCC) 03/16/2022   Type II diabetes mellitus with complication (HCC) 03/16/2022   Hypercholesterolemia 03/16/2022   Coronary artery disease 01/19/2022   NSTEMI (non-ST elevated myocardial infarction) (HCC) 01/17/2022   ACS (acute coronary syndrome) (HCC) 01/16/2022   Personal history of noncompliance with medical treatment, presenting hazards to health 12/13/2017   Uncontrolled type 2 diabetes mellitus with hyperglycemia (HCC) 08/30/2017   Mixed hyperlipidemia 08/30/2017   Physical exam, annual 01/28/2013   Overweight(278.02) 01/28/2013   History of nephrolithiasis 01/28/2013   Tendonitis of ankle or foot 01/28/2013   Past Surgical History:  Procedure Laterality Date   CORONARY ARTERY BYPASS GRAFT N/A 01/19/2022   Procedure: CORONARY ARTERY BYPASS GRAFTING (CABG), ON PUMP, TIMES THREE USING LEFT INTERNAL  MAMMARY ARTERY AND RIGHT ENDOSCOPICALLY HARVESTED GREATER SAPHENOUS VEIN;  Surgeon: Corliss Skains, MD;  Location: [facility] OR;  Service: Open Heart Surgery;  Laterality: N/A;  left radial artery harvest   dental sugery     at age 17   LEFT HEART CATH AND CORONARY ANGIOGRAPHY N/A 01/17/2022   Procedure: LEFT HEART CATH AND CORONARY ANGIOGRAPHY;  Surgeon: Yvonne Kendall, MD;  Location: [facility];  Service: Cardiovascular;  Laterality: N/A;   RADIAL ARTERY HARVEST Left 01/19/2022   Procedure: RADIAL ARTERY HARVEST;  Surgeon: Corliss Skains, MD;  Location: [facility] OR;  Service: Open Heart Surgery;  Laterality: Left;   TEE WITHOUT CARDIOVERSION N/A 01/19/2022   Procedure: TRANSESOPHAGEAL ECHOCARDIOGRAM (TEE);  Surgeon: Corliss Skains, MD;  Location: [facility] OR;  Service: Open Heart Surgery;  Laterality: N/A;   TONSILLECTOMY  1980     Home Medications    Prior to Admission medications   Medication Sig Start Date End Date Taking? Authorizing Provider  albuterol (VENTOLIN HFA) 108 (90 Base) MCG/ACT inhaler Inhale 1-2 puffs into the lungs every 6 (six) hours as needed for wheezing or shortness of breath. 05/04/22  Yes Particia Nearing, PA-C  cephALEXin (KEFLEX) 500 MG capsule Take 1 capsule (500 mg total) by mouth 2 (two) times daily. 05/04/22  Yes Particia Nearing, PA-C  amLODipine (NORVASC) 2.5 MG tablet Take 1 tablet (2.5 mg total) by mouth daily. 02/04/22   Jodelle Gross, NP  aspirin 81 MG tablet Take 1 tablet (81 mg total) by mouth daily. Patient not taking: Reported on 03/16/2022 01/23/22   Barrett, Rae Roam,  PA-C  cetirizine (ZYRTEC ALLERGY) 10 MG tablet Take 1 tablet (10 mg total) by mouth daily. Patient not taking: Reported on 03/16/2022 01/08/22   Particia Nearing, PA-C  clopidogrel (PLAVIX) 75 MG tablet Take 1 tablet (75 mg total) by mouth daily. 01/23/22 01/23/23  Barrett, Rae Roam, PA-C  ezetimibe (ZETIA) 10 MG tablet Take 1 tablet (10 mg total) by mouth daily. 04/22/22  04/17/23  Croitoru, Mihai, MD  fluticasone (FLONASE) 50 MCG/ACT nasal spray Place 1 spray into both nostrils 2 (two) times daily. Patient not taking: Reported on 03/16/2022 01/08/22   Particia Nearing, PA-C  Insulin Glargine [facility]) 100 UNIT/ML Inject 30 Units into the skin in the morning and at bedtime.    [provider]  KRILL OIL PO Take 1 capsule by mouth daily. Patient not taking: Reported on 03/16/2022    [provider]  metoprolol tartrate (LOPRESSOR) 25 MG tablet Take 1 tablet (25 mg total) by mouth 2 (two) times daily. 02/10/22 05/11/22  Jodelle Gross, NP  Multiple Vitamins-Minerals (CENTRUM SILVER 50+MEN PO) Take 1 tablet by mouth daily. Patient not taking: Reported on 03/16/2022    [provider]  rosuvastatin (CRESTOR) 40 MG tablet Take 1 tablet (40 mg total) by mouth daily. 01/23/22   Barrett, Erin R, PA-C  vitamin C (ASCORBIC ACID) 500 MG tablet Take 1,000 mg by mouth daily. Patient not taking: Reported on 03/16/2022    [provider]   Family History Family History  Problem Relation Age of Onset   Anxiety disorder Mother    Depression Father    Alcohol abuse Father    Social History Social History   Tobacco Use   Smoking status: Former    Types: Cigarettes   Smokeless tobacco: Never   Tobacco comments:    only smoked occasionally  Vaping Use   Vaping Use: Never used  Substance Use Topics   Alcohol use: No   Drug use: No    Allergies   Ciprofloxacin hcl, Codeine, Metformin and related, Penicillins, and Toujeo max solostar [insulin glargine]   Review of Systems Review of Systems PER HPI  Physical Exam Triage Vital Signs ED Triage Vitals  Enc Vitals Group     BP 05/04/22 0830 109/76     Pulse Rate 05/04/22 0830 85     Resp 05/04/22 0830 18     Temp 05/04/22 0830 98 F (36.7 C)     Temp src --      SpO2 05/04/22 0830 95 %     Weight --      Height --      Head Circumference --      Peak Flow --       Pain Score 05/04/22 0828 0     Pain Loc --      Pain Edu? --      Excl. in GC? --    No data found.  Updated Vital Signs BP 109/76   Pulse 85   Temp 98 F (36.7 C)   Resp 18   SpO2 95%   Visual Acuity Right Eye Distance:   Left Eye Distance:   Bilateral Distance:    Right Eye Near:   Left Eye Near:    Bilateral Near:     Physical Exam Vitals and nursing note reviewed.  Constitutional:      Appearance: Normal appearance.  HENT:     Head: Atraumatic.     Nose: Congestion present.     Mouth/Throat:  Mouth: Mucous membranes are moist.  Eyes:     Extraocular Movements: Extraocular movements intact.     Conjunctiva/sclera: Conjunctivae normal.  Cardiovascular:     Rate and Rhythm: Normal rate and regular rhythm.     Heart sounds: Normal heart sounds.  Pulmonary:     Effort: Pulmonary effort is normal.     Breath sounds: Normal breath sounds. No wheezing or rales.  Abdominal:     General: Bowel sounds are normal. There is no distension.     Palpations: Abdomen is soft.     Tenderness: There is no abdominal tenderness. There is no guarding.  Genitourinary:    Comments: GU exam deferred to urology Musculoskeletal:        General: Normal range of motion.     Cervical back: Normal range of motion and neck supple.  Skin:    General: Skin is warm and dry.  Neurological:     General: No focal deficit present.     Mental Status: He is oriented to person, place, and time.  Psychiatric:        Mood and Affect: Mood normal.        Thought Content: Thought content normal.        Judgment: Judgment normal.    UC Treatments / Results  Labs (all labs ordered are listed, but only abnormal results are displayed) Labs Reviewed  POCT URINALYSIS DIP (MANUAL ENTRY) - Abnormal; Notable for the following components:      Result Value   Glucose, UA =100 (*)    Ketones, POC UA trace (5) (*)    Protein Ur, POC trace (*)    All other components within normal limits    EKG  Radiology No results found.  Procedures Procedures (including critical care time)  Medications Ordered in UC Medications - No data to display  Initial Impression / Assessment and Plan / UC Course  I have reviewed the triage vital signs and the nursing notes.  Pertinent labs & imaging results that were available during my care of the patient were reviewed by me and considered in my medical decision making (see chart for details).     Overall exam and vital signs very reassuring, suspect allergy exacerbation causing cough but could also be indicative of a viral upper respiratory infection.  Discussed restarting allergy regimen, albuterol inhaler as needed for cough and chest tightness.  No evidence of a urinary tract infection today, however does have a history of prostatitis and has responded well to Keflex for his urinary symptoms in the past.  Discussed importance of urology work-up to figure out the ultimate cause of his episodic bouts of dysuria.  In the meantime will prescribe a course of Keflex and discussed good fluid intake.  Return for any worsening symptoms.  Final Clinical Impressions(s) / UC Diagnoses   Final diagnoses:  Acute cough  Nasal congestion  Dysuria  History of prostatitis   Discharge Instructions   None    ED Prescriptions     Medication Sig Dispense Auth. Provider   albuterol (VENTOLIN HFA) 108 (90 Base) MCG/ACT inhaler Inhale 1-2 puffs into the lungs every 6 (six) hours as needed for wheezing or shortness of breath. 18 g Particia Nearing, PA-C   cephALEXin (KEFLEX) 500 MG capsule Take 1 capsule (500 mg total) by mouth 2 (two) times daily. 14 capsule Particia Nearing, [place]      PDMP not reviewed this encounter.   Particia Nearing, [place] 05/04/22 1424

## 2022-05-04 NOTE — ED Triage Notes (Signed)
Pt presents with c/o chest congestion and cough for past week, also has burning with urination

## 2022-05-09 ENCOUNTER — Other Ambulatory Visit: Payer: Self-pay | Admitting: *Deleted

## 2022-05-09 MED ORDER — METOPROLOL TARTRATE 25 MG PO TABS: 25.0000 mg | ORAL_TABLET | Freq: Two times a day (BID) | ORAL | 3 refills | Status: DC

## 2022-05-24 ENCOUNTER — Other Ambulatory Visit: Payer: Self-pay | Admitting: Physician Assistant

## 2022-05-31 ENCOUNTER — Other Ambulatory Visit: Payer: Self-pay | Admitting: Family Medicine

## 2022-06-03 ENCOUNTER — Other Ambulatory Visit: Payer: Self-pay | Admitting: *Deleted

## 2022-06-03 MED ORDER — ROSUVASTATIN CALCIUM 40 MG PO TABS: 40.0000 mg | ORAL_TABLET | Freq: Every day | ORAL | 3 refills | Status: DC

## 2022-06-20 DIAGNOSIS — E119 Type 2 diabetes mellitus without complications: Secondary | ICD-10-CM | POA: Diagnosis not present

## 2022-06-23 ENCOUNTER — Ambulatory Visit
Admission: EM | Admit: 2022-06-23 | Discharge: 2022-06-23 | Disposition: A | Discharge status: Home / Self Care | Payer: 59 | Attending: Urgent Care | Admitting: Urgent Care

## 2022-06-23 DIAGNOSIS — E119 Type 2 diabetes mellitus without complications: Secondary | ICD-10-CM | POA: Diagnosis not present

## 2022-06-23 DIAGNOSIS — K047 Periapical abscess without sinus: Secondary | ICD-10-CM

## 2022-06-23 DIAGNOSIS — Z794 Long term (current) use of insulin: Secondary | ICD-10-CM | POA: Diagnosis not present

## 2022-06-23 MED ORDER — CLINDAMYCIN HCL 300 MG PO CAPS: 300.0000 mg | ORAL_CAPSULE | Freq: Three times a day (TID) | ORAL | 0 refills | Status: DC

## 2022-06-23 NOTE — ED Triage Notes (Signed)
Pt states dental pain on the right side with some swelling for the past 4 days, states he has been taking Tylenol with some relief.

## 2022-06-23 NOTE — ED Provider Notes (Signed)
[facility]   MRN: [id number] DOB: 06-Jan-1961  Subjective:   Jacob Acosta is a 61 y.o. male with pmh of DM treated with insulin presenting for 4-day history of recurrent moderate to severe right-sided lower dental pain with swelling.  Has had some painful chewing.  Has a history of dental issues and plans on getting in with a dental specialist.  No fever, chest pain, nausea, vomiting, difficulty opening mouth.  No history of CKD (I confirmed through lab review of creatinine levels).   No current facility-administered medications for this encounter.  Current Outpatient Medications:    albuterol (VENTOLIN HFA) 108 (90 Base) MCG/ACT inhaler, Inhale 1-2 puffs into the lungs every 6 (six) hours as needed for wheezing or shortness of breath., Disp: 18 g, Rfl: 0   amLODipine (NORVASC) 2.5 MG tablet, Take 1 tablet (2.5 mg total) by mouth daily., Disp: 90 tablet, Rfl: 3   aspirin 81 MG tablet, Take 1 tablet (81 mg total) by mouth daily. (Patient not taking: Reported on 03/16/2022), Disp: 30 tablet, Rfl:    cephALEXin (KEFLEX) 500 MG capsule, Take 1 capsule (500 mg total) by mouth 2 (two) times daily., Disp: 14 capsule, Rfl: 0   cetirizine (ZYRTEC ALLERGY) 10 MG tablet, Take 1 tablet (10 mg total) by mouth daily. (Patient not taking: Reported on 03/16/2022), Disp: 30 tablet, Rfl: 2   clopidogrel (PLAVIX) 75 MG tablet, Take 1 tablet (75 mg total) by mouth daily., Disp: 30 tablet, Rfl: 11   ezetimibe (ZETIA) 10 MG tablet, Take 1 tablet (10 mg total) by mouth daily., Disp: 90 tablet, Rfl: 3   fluticasone (FLONASE) 50 MCG/ACT nasal spray, Place 1 spray into both nostrils 2 (two) times daily. (Patient not taking: Reported on 03/16/2022), Disp: 16 g, Rfl: 2   Insulin Glargine (BASAGLAR KWIKPEN) 100 UNIT/ML, Inject 30 Units into the skin in the morning and at bedtime., Disp: , Rfl:    KRILL OIL PO, Take 1 capsule by mouth daily. (Patient not taking: Reported on 03/16/2022), Disp: , Rfl:     metoprolol tartrate (LOPRESSOR) 25 MG tablet, Take 1 tablet (25 mg total) by mouth 2 (two) times daily., Disp: 180 tablet, Rfl: 3   Multiple Vitamins-Minerals (CENTRUM SILVER 50+MEN PO), Take 1 tablet by mouth daily. (Patient not taking: Reported on 03/16/2022), Disp: , Rfl:    rosuvastatin (CRESTOR) 40 MG tablet, Take 1 tablet (40 mg total) by mouth daily., Disp: 90 tablet, Rfl: 3   vitamin C (ASCORBIC ACID) 500 MG tablet, Take 1,000 mg by mouth daily. (Patient not taking: Reported on 03/16/2022), Disp: , Rfl:    Allergies  Allergen Reactions   Ciprofloxacin Hcl Other (See Comments)    Hallucinations    Codeine Anxiety and Other (See Comments)    "Causes him to feel very anxious"   Metformin And Related Nausea Only and Palpitations   Penicillins Rash    Has patient had a PCN reaction causing immediate rash, facial/tongue/throat swelling, SOB or lightheadedness with hypotension: Yes Has patient had a PCN reaction causing severe rash involving mucus membranes or skin necrosis: No Has patient had a PCN reaction that required hospitalization: No Has patient had a PCN reaction occurring within the last 10 years: Yes If all of the above answers are "NO", then may proceed with Cephalosporin use.    Toujeo Max Solostar [Insulin Glargine] Itching, Nausea Only, Palpitations and Other (See Comments)    Itching throat    Past Medical History:  Diagnosis Date   Anxiety  Diabetes mellitus without complication (HCC)    High cholesterol      Past Surgical History:  Procedure Laterality Date   CORONARY ARTERY BYPASS GRAFT N/A 01/19/2022   Procedure: CORONARY ARTERY BYPASS GRAFTING (CABG), ON PUMP, TIMES THREE USING LEFT INTERNAL MAMMARY ARTERY AND RIGHT ENDOSCOPICALLY HARVESTED GREATER SAPHENOUS VEIN;  Surgeon: Corliss Skains, MD;  Location: [facility] OR;  Service: Open Heart Surgery;  Laterality: N/A;  left radial artery harvest   dental sugery     at age 38   LEFT HEART CATH AND CORONARY ANGIOGRAPHY  N/A 01/17/2022   Procedure: LEFT HEART CATH AND CORONARY ANGIOGRAPHY;  Surgeon: Yvonne Kendall, MD;  Location: [facility];  Service: Cardiovascular;  Laterality: N/A;   RADIAL ARTERY HARVEST Left 01/19/2022   Procedure: RADIAL ARTERY HARVEST;  Surgeon: Corliss Skains, MD;  Location: [facility] OR;  Service: Open Heart Surgery;  Laterality: Left;   TEE WITHOUT CARDIOVERSION N/A 01/19/2022   Procedure: TRANSESOPHAGEAL ECHOCARDIOGRAM (TEE);  Surgeon: Corliss Skains, MD;  Location: [facility] OR;  Service: Open Heart Surgery;  Laterality: N/A;   TONSILLECTOMY  1980    Family History  Problem Relation Age of Onset   Anxiety disorder Mother    Depression Father    Alcohol abuse Father     Social History   Tobacco Use   Smoking status: Former    Types: Cigarettes   Smokeless tobacco: Never   Tobacco comments:    only smoked occasionally  Vaping Use   Vaping Use: Never used  Substance Use Topics   Alcohol use: No   Drug use: No    ROS   Objective:   Vitals: BP (!) 146/97 (BP Location: Right Arm)   Pulse 71   Temp (!) 97.4 F (36.3 C) (Oral)   Resp 16   SpO2 97%   Physical Exam Constitutional:      General: He is not in acute distress.    Appearance: Normal appearance. He is well-developed and normal weight. He is not ill-appearing, toxic-appearing or diaphoretic.  HENT:     Head: Normocephalic and atraumatic.     Right Ear: External ear normal.     Left Ear: External ear normal.     Nose: Nose normal.     Mouth/Throat:   Eyes:     General: No scleral icterus.       Right eye: No discharge.        Left eye: No discharge.     Extraocular Movements: Extraocular movements intact.  Cardiovascular:     Rate and Rhythm: Normal rate.  Pulmonary:     Effort: Pulmonary effort is normal.  Musculoskeletal:     Cervical back: Normal range of motion.  Neurological:     Mental Status: He is alert and oriented to person, place, and time.  Psychiatric:        Mood and  Affect: Mood normal.        Behavior: Behavior normal.        Thought Content: Thought content normal.        Judgment: Judgment normal.     Assessment and Plan :   PDMP not reviewed this encounter.  1. Dental infection   2. Diabetes mellitus treated with insulin (HCC)    Start clindamycin given his penicillin allergy for dental infection/abscess, use Tylenol for pain. Emphasized need for dental surgeon consult. Counseled patient on potential for adverse effects with medications prescribed/recommended today, strict ER and return-to-clinic precautions discussed, patient verbalized  understanding.    Wallis Bamberg, PA-C 06/23/22 1431

## 2022-06-23 NOTE — Discharge Instructions (Addendum)
Make sure you schedule an appointment with a dentist/dental surgeon as soon as possible.  You may try some of the resources below.    [facility] in [place],  Address: [address], [place], [place] [address] Phone: [phone]  [facility] [phone] extension [phone]  Dr. Civils [phone] [address]  [facility] [phone] [address]  [facility] [phone] extension [phone] [place], [place], [address] First come first serve for the first 10 clients.  May do simple extractions only, no wisdom teeth or surgery.  You may try the second for Thursday of the month starting at 6:30 AM.  [employer] You may call the school to see if they are still helping to provide dental care for emergent cases.  

## 2022-06-25 DIAGNOSIS — E119 Type 2 diabetes mellitus without complications: Secondary | ICD-10-CM | POA: Diagnosis not present

## 2022-06-25 DIAGNOSIS — I252 Old myocardial infarction: Secondary | ICD-10-CM | POA: Diagnosis not present

## 2022-06-25 DIAGNOSIS — I2510 Atherosclerotic heart disease of native coronary artery without angina pectoris: Secondary | ICD-10-CM | POA: Diagnosis not present

## 2022-06-25 DIAGNOSIS — Z6837 Body mass index (BMI) 37.0-37.9, adult: Secondary | ICD-10-CM | POA: Diagnosis not present

## 2022-06-25 DIAGNOSIS — E6609 Other obesity due to excess calories: Secondary | ICD-10-CM | POA: Diagnosis not present

## 2022-06-25 DIAGNOSIS — E782 Mixed hyperlipidemia: Secondary | ICD-10-CM | POA: Diagnosis not present

## 2022-06-25 DIAGNOSIS — K0889 Other specified disorders of teeth and supporting structures: Secondary | ICD-10-CM | POA: Diagnosis not present

## 2022-06-25 DIAGNOSIS — I10 Essential (primary) hypertension: Secondary | ICD-10-CM | POA: Diagnosis not present

## 2022-07-13 ENCOUNTER — Other Ambulatory Visit: Payer: Self-pay | Admitting: Family Medicine

## 2022-07-14 NOTE — Telephone Encounter (Signed)
Urgent Care Patient Requested Prescriptions  Pending Prescriptions Disp Refills  . albuterol (VENTOLIN HFA) 108 (90 Base) MCG/ACT inhaler [Pharmacy Med Name: ALBUTEROL HFA (VENTOLIN) INH] 18 each     Sig: INHALE 1-2 PUFFS BY MOUTH EVERY 6 HOURS AS NEEDED FOR WHEEZE OR SHORTNESS OF BREATH     There is no refill protocol information for this order

## 2022-07-19 NOTE — Progress Notes (Unsigned)
Cardiology Office Note:    Date:  07/20/2022   ID:  Jacob Acosta, DOB July 04, 1961, MRN [id number]  PCP:  Benita Stabile, MD  Cardiologist:  Thurmon Fair, MD    Referring MD: Benita Stabile, MD   Chief Complaint  Patient presents with   Coronary Artery Disease    History of Present Illness:    Jacob Acosta is a 61 y.o. male with a hx of CAD s/p CABG (LIMA-LAD, radial-OM1, SVG-R PDA, Dr. Cliffton Asters 01/19/2022) after NSTEMI with 90% distal left main stenosis by cath 01/17/2022, hypercholesterolemia, poorly controlled type 2 diabetes mellitus, mild obesity, generalized anxiety disorder.  Jacob Acosta is doing great and feels well.  He exercises or as much as 3 hours a day 7 days a week.  He has been careful with upper body exercises due to his sternotomy, still feels has some numbness in that area and occasional muscle twinge that resolves with ibuprofen.  He is eating a much healthier diet.  Rarely eats red meat.  Eats a lot of salmon and tuna.  Had recent labs with Dr. Margo Aye, but I do not have those available for review now.  He tells me that Dr. Margo Aye was very excited about his current cholesterol levels being so good.  The patient specifically denies any chest pain at rest exertion, dyspnea at rest or with exertion, orthopnea, paroxysmal nocturnal dyspnea, syncope, palpitations, focal neurological deficits, intermittent claudication, lower extremity edema, unexplained weight gain, cough, hemoptysis or wheezing.  At the time of his presentation with non-STEMI his hemoglobin A1c was 9.1% and his LDL cholesterol was 139.  He is currently on Lantus insulin for glucose control and has occasional nocturnal mild hypoglycemia.  He is taking maximum dose rosuvastatin without side effects, plus ezetimibe.  He is on a low-dose of metoprolol and amlodipine and his blood pressure is on the low end of normal.  Since he presented with acute coronary syndrome, he is taking both aspirin and clopidogrel through March  2024.  Past Medical History:  Diagnosis Date   Anxiety    Diabetes mellitus without complication (HCC)    High cholesterol     Past Surgical History:  Procedure Laterality Date   CORONARY ARTERY BYPASS GRAFT N/A 01/19/2022   Procedure: CORONARY ARTERY BYPASS GRAFTING (CABG), ON PUMP, TIMES THREE USING LEFT INTERNAL MAMMARY ARTERY AND RIGHT ENDOSCOPICALLY HARVESTED GREATER SAPHENOUS VEIN;  Surgeon: Corliss Skains, MD;  Location: [facility] OR;  Service: Open Heart Surgery;  Laterality: N/A;  left radial artery harvest   dental sugery     at age 34   LEFT HEART CATH AND CORONARY ANGIOGRAPHY N/A 01/17/2022   Procedure: LEFT HEART CATH AND CORONARY ANGIOGRAPHY;  Surgeon: Yvonne Kendall, MD;  Location: [facility];  Service: Cardiovascular;  Laterality: N/A;   RADIAL ARTERY HARVEST Left 01/19/2022   Procedure: RADIAL ARTERY HARVEST;  Surgeon: Corliss Skains, MD;  Location: [facility] OR;  Service: Open Heart Surgery;  Laterality: Left;   TEE WITHOUT CARDIOVERSION N/A 01/19/2022   Procedure: TRANSESOPHAGEAL ECHOCARDIOGRAM (TEE);  Surgeon: Corliss Skains, MD;  Location: [facility];  Service: Open Heart Surgery;  Laterality: N/A;   TONSILLECTOMY  1980    Current Medications: Current Meds  Medication Sig   aspirin 81 MG tablet Take 1 tablet (81 mg total) by mouth daily.   clopidogrel (PLAVIX) 75 MG tablet Take 1 tablet (75 mg total) by mouth daily.   ezetimibe (ZETIA) 10 MG tablet Take 1 tablet (10 mg  total) by mouth daily.   Insulin Glargine (BASAGLAR KWIKPEN) 100 UNIT/ML Inject 33 Units into the skin in the morning and at bedtime.   metoprolol tartrate (LOPRESSOR) 25 MG tablet Take 1 tablet (25 mg total) by mouth 2 (two) times daily.   Multiple Vitamins-Minerals (CENTRUM SILVER 50+MEN PO) Take 1 tablet by mouth daily.   rosuvastatin (CRESTOR) 40 MG tablet Take 1 tablet (40 mg total) by mouth daily.   vitamin C (ASCORBIC ACID) 500 MG tablet Take 1,000 mg by mouth daily.   [DISCONTINUED]  amLODipine (NORVASC) 2.5 MG tablet Take 1 tablet (2.5 mg total) by mouth daily.     Allergies:   Ciprofloxacin hcl, Codeine, Metformin and related, Penicillins, and Toujeo max solostar [insulin glargine]   Social History   Socioeconomic History   Marital status: Married    Spouse name: kay Gougeon   Number of children: 0   Years of education: Not on file   Highest education level: Not on file  Occupational History   Occupation: drives delivery truck  Tobacco Use   Smoking status: Former    Types: Cigarettes   Smokeless tobacco: Never   Tobacco comments:    only smoked occasionally  Vaping Use   Vaping Use: Never used  Substance and Sexual Activity   Alcohol use: No   Drug use: No   Sexual activity: Not Currently  Other Topics Concern   Not on file  Social History Narrative   Not on file   Social Determinants of Health   Financial Resource Strain: Not on file  Food Insecurity: Not on file  Transportation Needs: Not on file  Physical Activity: Not on file  Stress: Not on file  Social Connections: Not on file     Family History: The patient's family history includes Alcohol abuse in his father; Anxiety disorder in his mother; Depression in his father. ROS:   Please see the history of present illness.     All other systems reviewed and are negative.  EKGs/Labs/Other Studies Reviewed:    Cardiac catheterization 01/17/2022 Critical distal LMCA stenosis with 90% eccentric narrowing and TIMI-1 flow in the distal LAD (also supplied by right-to-left collaterals). Moderate-severe LCx and RCA disease with 60% mid/distal LCx and 70% proximal RCA lesions. Normal left ventricular systolic function (LVEF [id number]) with mildly elevated filling pressure (LVEDP 20-25 mmHg).   Recommendations: Cardiac surgery consultation for CABG; transfer to [place] for close monitoring pending surgical evaluation. Restart heparin infusion 2 hours after TR band removal. Aggressive secondary  prevention.   Dominance: Right    Comparison(s): No prior Echocardiogram.  Echocardiogram 01/18/2022:   1. Left ventricular ejection fraction, by estimation, is 60 to 65%. Left  ventricular ejection fraction by 3D volume is 60 %. The left ventricle has  normal function. The left ventricle has no regional wall motion  abnormalities. There is mild left  ventricular hypertrophy. Left ventricular diastolic parameters are  consistent with Grade I diastolic dysfunction (impaired relaxation).   2. Right ventricular systolic function is normal. The right ventricular  size is normal.   3. The mitral valve is grossly normal. Trivial mitral valve  regurgitation.   4. The aortic valve is tricuspid. Aortic valve regurgitation is not  visualized.   5. Aortic dilatation noted. There is mild dilatation of the ascending  aorta, measuring 40 mm.   6. The inferior vena cava is dilated in size with >50% respiratory  variability, suggesting right atrial pressure of 8 mmHg.   Comparison(s): No prior Echocardiogram.  EKG:  EKG is ordered today.  The ekg ordered today demonstrates Sinus rhythm, normal tracing, QTC 420 ms  Recent Labs: 01/16/2022: ALT 28 01/17/2022: TSH 1.169 01/20/2022: Magnesium 1.8 01/22/2022: BUN 12; Creatinine, Ser 0.80; Hemoglobin 10.2; Platelets 172; Potassium 3.7; Sodium 133  Recent Lipid Panel    Component Value Date/Time   CHOL 199 01/17/2022 0217   TRIG 63 01/17/2022 0217   HDL 47 01/17/2022 0217   CHOLHDL 4.2 01/17/2022 0217   VLDL 13 01/17/2022 0217   LDLCALC 139 (H) 01/17/2022 0217   LDLDIRECT 153.4 01/28/2013 1243    Physical Exam:    VS:  BP 102/74 (BP Location: Left Arm, Patient Position: Sitting, Cuff Size: Large)   Pulse (!) 57   Ht 5\' 6"  (1.676 m)   Wt 227 lb (103 kg)   SpO2 97%   BMI 36.64 kg/m     Wt Readings from Last 3 Encounters:  07/20/22 227 lb (103 kg)  03/16/22 224 lb 9.6 oz (101.9 kg)  03/01/22 217 lb (98.4 kg)      General: Alert,  oriented x3, no distress, severely obese Head: no evidence of trauma, PERRL, EOMI, no exophtalmos or lid lag, no myxedema, no xanthelasma; normal ears, nose and oropharynx Neck: normal jugular venous pulsations and no hepatojugular reflux; brisk carotid pulses without delay and no carotid bruits Chest: clear to auscultation, no signs of consolidation by percussion or palpation, normal fremitus, symmetrical and full respiratory excursions Cardiovascular: normal position and quality of the apical impulse, regular rhythm, normal first and second heart sounds, no murmurs, rubs or gallops Abdomen: no tenderness or distention, no masses by palpation, no abnormal pulsatility or arterial bruits, normal bowel sounds, no hepatosplenomegaly Extremities: no clubbing, cyanosis or edema; 2+ radial, ulnar and brachial pulses bilaterally; 2+ right femoral, posterior tibial and dorsalis pedis pulses; 2+ left femoral, posterior tibial and dorsalis pedis pulses; no subclavian or femoral bruits Neurological: grossly nonfocal Psych: Normal mood and affect    ASSESSMENT:    1. Coronary artery disease involving native coronary artery of native heart without angina pectoris   2. Hypercholesterolemia   3. Type II diabetes mellitus with complication (HCC)   4. Severe obesity (BMI 35.0-39.9) with comorbidity (HCC)      PLAN:    In order of problems listed above:  CAD: Medic.  Recovering very well following bypass surgery.  Has made a lot of healthy lifestyle changes, but unfortunately is not losing weight. HLP: Get labs from Dr. 10-14-1990.  Apparently his LDL is now at target range. DM: Still on Lantus insulin.  Need to see what his A1c is running.  Ideally will gradually transition to insulin sensitizing medication such as metformin, SGLT2 inhibitors and/or GLP-1 agonist for their better outcomes in patients with heart disease. Obesity: Again reviewed healthy dietary changes to help him get down to a weight under 200  pounds before the end of the year.    Medication Adjustments/Labs and Tests Ordered: Current medicines are reviewed at length with the patient today.  Concerns regarding medicines are outlined above.  Orders Placed This Encounter  Procedures   EKG 12-Lead   No orders of the defined types were placed in this encounter.   Signed, Margo Aye, MD  07/20/2022 11:54 AM    [facility]

## 2022-07-20 ENCOUNTER — Encounter: Payer: Self-pay | Admitting: Cardiovascular Disease

## 2022-07-20 ENCOUNTER — Ambulatory Visit: Payer: 59 | Attending: Cardiovascular Disease | Admitting: Cardiovascular Disease

## 2022-07-20 VITALS — BP 102/74 | HR 57 | Ht 66.0 in | Wt 227.0 lb

## 2022-07-20 DIAGNOSIS — E118 Type 2 diabetes mellitus with unspecified complications: Secondary | ICD-10-CM | POA: Diagnosis not present

## 2022-07-20 DIAGNOSIS — I2510 Atherosclerotic heart disease of native coronary artery without angina pectoris: Secondary | ICD-10-CM

## 2022-07-20 DIAGNOSIS — E7800 Pure hypercholesterolemia, unspecified: Secondary | ICD-10-CM

## 2022-07-20 NOTE — Patient Instructions (Signed)
Medication Instructions:  STOP the Amlodipine  *If you need a refill on your cardiac medications before your next appointment, please call your pharmacy*   Lab Work: None ordered If you have labs (blood work) drawn today and your tests are completely normal, you will receive your results only by: MyChart Message (if you have MyChart) OR A paper copy in the mail If you have any lab test that is abnormal or we need to change your treatment, we will call you to review the results.   Testing/Procedures: None ordered   Follow-Up: At [facility], you and your health needs are our priority.  As part of our continuing mission to provide you with exceptional heart care, we have created designated Provider Care Teams.  These Care Teams include your primary Cardiologist (physician) and Advanced Practice Providers (APPs -  Physician Assistants and Nurse Practitioners) who all work together to provide you with the care you need, when you need it.  We recommend signing up for the patient portal called "MyChart".  Sign up information is provided on this After Visit Summary.  MyChart is used to connect with patients for Virtual Visits (Telemedicine).  Patients are able to view lab/test results, encounter notes, upcoming appointments, etc.  Non-urgent messages can be sent to your provider as well.   To learn more about what you can do with MyChart, go to [email or web address].    Your next appointment:   12 month(s)  The format for your next appointment:   In Person  Provider:   Thurmon Fair, MD     Important Information About Sugar

## 2022-09-22 DIAGNOSIS — E119 Type 2 diabetes mellitus without complications: Secondary | ICD-10-CM | POA: Diagnosis not present

## 2022-09-24 DIAGNOSIS — I10 Essential (primary) hypertension: Secondary | ICD-10-CM | POA: Diagnosis not present

## 2022-09-24 DIAGNOSIS — I252 Old myocardial infarction: Secondary | ICD-10-CM | POA: Diagnosis not present

## 2022-09-24 DIAGNOSIS — E782 Mixed hyperlipidemia: Secondary | ICD-10-CM | POA: Diagnosis not present

## 2022-09-24 DIAGNOSIS — E1165 Type 2 diabetes mellitus with hyperglycemia: Secondary | ICD-10-CM | POA: Diagnosis not present

## 2022-09-24 DIAGNOSIS — I2510 Atherosclerotic heart disease of native coronary artery without angina pectoris: Secondary | ICD-10-CM | POA: Diagnosis not present

## 2022-11-15 ENCOUNTER — Other Ambulatory Visit: Payer: Self-pay | Admitting: Family Medicine

## 2022-11-16 NOTE — Telephone Encounter (Signed)
Urgent care patient Requested Prescriptions  Pending Prescriptions Disp Refills   albuterol (VENTOLIN HFA) 108 (90 Base) MCG/ACT inhaler [Pharmacy Med Name: ALBUTEROL HFA (VENTOLIN) INH] 18 each     Sig: INHALE 1-2 PUFFS BY MOUTH EVERY 6 HOURS AS NEEDED FOR WHEEZE OR SHORTNESS OF BREATH     There is no refill protocol information for this order

## 2022-12-03 ENCOUNTER — Ambulatory Visit
Admission: EM | Admit: 2022-12-03 | Discharge: 2022-12-03 | Disposition: A | Discharge status: Home / Self Care | Payer: 59 | Attending: Emergency Medicine | Admitting: Emergency Medicine

## 2022-12-03 DIAGNOSIS — R300 Dysuria: Secondary | ICD-10-CM | POA: Insufficient documentation

## 2022-12-03 DIAGNOSIS — R110 Nausea: Secondary | ICD-10-CM | POA: Diagnosis not present

## 2022-12-03 LAB — POCT URINALYSIS DIP (MANUAL ENTRY)
Bilirubin, UA: NEGATIVE
Blood, UA: NEGATIVE
Glucose, UA: NEGATIVE mg/dL
Leukocytes, UA: NEGATIVE
Nitrite, UA: NEGATIVE
Spec Grav, UA: >=1.03 — AB (ref 1.010–1.025)
Urobilinogen, UA: NEGATIVE E.U./dL — AB
pH, UA: 5.5 (ref 5.0–8.0)

## 2022-12-03 MED ORDER — ONDANSETRON HCL 4 MG PO TABS: 4.0000 mg | ORAL_TABLET | Freq: Three times a day (TID) | ORAL | 0 refills | Status: AC | PRN

## 2022-12-03 MED ORDER — PHENAZOPYRIDINE HCL 100 MG PO TABS: 100.0000 mg | ORAL_TABLET | Freq: Three times a day (TID) | ORAL | 0 refills | Status: DC | PRN

## 2022-12-03 NOTE — Discharge Instructions (Addendum)
Urine culture sent.  We will call you with the results.   Push fluids and get plenty of rest.   Zofran was prescribed for nausea Take pyridium as prescribed and as needed for symptomatic relief Follow up with PCP if symptoms persists Return here or go to ER if you have any new or worsening symptoms such as fever, worsening abdominal pain, nausea/vomiting, flank pain, etc..Marland Kitchen

## 2022-12-03 NOTE — ED Triage Notes (Signed)
Pt reports burning when urinating and nausea x 2 weeks.

## 2022-12-03 NOTE — ED Provider Notes (Signed)
[facility]   CC: Burning with urination  SUBJECTIVE:  Jacob Acosta is a 62 y.o. male who presented to the urgent care with a complaint of dysuria and nausea for the past 2 weeks.  Patient denies a precipitating event, recent sexual encounter, excessive caffeine intake.  Has not tried any OTC medication for relief.  Symptoms are made worse with urination.  Denies similar symptoms in the past.  Denies fever, chills, vomiting, abdominal pain, flank pain, abnormal vaginal discharge or bleeding, hematuria.    LMP: No LMP for male patient.  ROS: As in HPI.  All other pertinent ROS negative.     Past Medical History:  Diagnosis Date   Anxiety    Diabetes mellitus without complication (HCC)    High cholesterol    Past Surgical History:  Procedure Laterality Date   CORONARY ARTERY BYPASS GRAFT N/A 01/19/2022   Procedure: CORONARY ARTERY BYPASS GRAFTING (CABG), ON PUMP, TIMES THREE USING LEFT INTERNAL MAMMARY ARTERY AND RIGHT ENDOSCOPICALLY HARVESTED GREATER SAPHENOUS VEIN;  Surgeon: Lajuana Matte, MD;  Location: [place];  Service: Open Heart Surgery;  Laterality: N/A;  left radial artery harvest   dental sugery     at age 33   LEFT HEART CATH AND CORONARY ANGIOGRAPHY N/A 01/17/2022   Procedure: LEFT HEART CATH AND CORONARY ANGIOGRAPHY;  Surgeon: Nelva Bush, MD;  Location: [place] [facility];  Service: Cardiovascular;  Laterality: N/A;   RADIAL ARTERY HARVEST Left 01/19/2022   Procedure: RADIAL ARTERY HARVEST;  Surgeon: Lajuana Matte, MD;  Location: [place];  Service: Open Heart Surgery;  Laterality: Left;   TEE WITHOUT CARDIOVERSION N/A 01/19/2022   Procedure: TRANSESOPHAGEAL ECHOCARDIOGRAM (TEE);  Surgeon: Lajuana Matte, MD;  Location: [place];  Service: Open Heart Surgery;  Laterality: N/A;   TONSILLECTOMY  1980   Allergies  Allergen Reactions   Ciprofloxacin Hcl Other (See Comments)    Hallucinations    Codeine Anxiety and Other (See Comments)    "Causes  him to feel very anxious"   Metformin And Related Nausea Only and Palpitations   Penicillins Rash    Has patient had a PCN reaction causing immediate rash, facial/tongue/throat swelling, SOB or lightheadedness with hypotension: Yes Has patient had a PCN reaction causing severe rash involving mucus membranes or skin necrosis: No Has patient had a PCN reaction that required hospitalization: No Has patient had a PCN reaction occurring within the last 10 years: Yes If all of the above answers are "NO", then may proceed with Cephalosporin use.    Toujeo Max Solostar [Insulin Glargine] Itching, Nausea Only, Palpitations and Other (See Comments)    Itching throat   No current facility-administered medications on file prior to encounter.   Current Outpatient Medications on File Prior to Encounter  Medication Sig Dispense Refill   albuterol (VENTOLIN HFA) 108 (90 Base) MCG/ACT inhaler Inhale 1-2 puffs into the lungs every 6 (six) hours as needed for wheezing or shortness of breath. (Patient not taking: Reported on 07/20/2022) 18 g 0   aspirin 81 MG tablet Take 1 tablet (81 mg total) by mouth daily. 30 tablet    cetirizine (ZYRTEC ALLERGY) 10 MG tablet Take 1 tablet (10 mg total) by mouth daily. (Patient not taking: Reported on 07/20/2022) 30 tablet 2   clopidogrel (PLAVIX) 75 MG tablet Take 1 tablet (75 mg total) by mouth daily. 30 tablet 11   ezetimibe (ZETIA) 10 MG tablet Take 1 tablet (10 mg total) by mouth daily. 90 tablet  3   fluticasone (FLONASE) 50 MCG/ACT nasal spray Place 1 spray into both nostrils 2 (two) times daily. (Patient not taking: Reported on 07/20/2022) 16 g 2   Insulin Glargine (BASAGLAR KWIKPEN) 100 UNIT/ML Inject 33 Units into the skin in the morning and at bedtime.     metoprolol tartrate (LOPRESSOR) 25 MG tablet Take 1 tablet (25 mg total) by mouth 2 (two) times daily. 180 tablet 3   Multiple Vitamins-Minerals (CENTRUM SILVER 50+MEN PO) Take 1 tablet by mouth daily.     rosuvastatin  (CRESTOR) 40 MG tablet Take 1 tablet (40 mg total) by mouth daily. 90 tablet 3   vitamin C (ASCORBIC ACID) 500 MG tablet Take 1,000 mg by mouth daily.     Social History   Socioeconomic History   Marital status: Married    Spouse name: kay Hanley   Number of children: 0   Years of education: Not on file   Highest education level: Not on file  Occupational History   Occupation: drives delivery truck  Tobacco Use   Smoking status: Former    Types: Cigarettes   Smokeless tobacco: Never   Tobacco comments:    only smoked occasionally  Vaping Use   Vaping Use: Never used  Substance and Sexual Activity   Alcohol use: No   Drug use: No   Sexual activity: Not Currently  Other Topics Concern   Not on file  Social History Narrative   Not on file   Social Determinants of Health   Financial Resource Strain: Not on file  Food Insecurity: Not on file  Transportation Needs: Not on file  Physical Activity: Not on file  Stress: Not on file  Social Connections: Not on file  Intimate Partner Violence: Not on file   Family History  Problem Relation Age of Onset   Anxiety disorder Mother    Depression Father    Alcohol abuse Father     OBJECTIVE:  Vitals:   12/03/22 1357  BP: 132/85  Pulse: 61  Resp: 17  Temp: (!) 97.4 F (36.3 C)  TempSrc: Oral  SpO2: 97%   General appearance: AOx3 in no acute distress HEENT: NCAT.  Oropharynx clear.  Lungs: clear to auscultation bilaterally without adventitious breath sounds Heart: regular rate and rhythm.  Radial pulses 2+ symmetrical bilaterally Abdomen: soft; non-distended; no tenderness; bowel sounds present; no guarding or rebound tenderness Back: no CVA tenderness Extremities: no edema; symmetrical with no gross deformities Skin: warm and dry Neurologic: Ambulates from chair to exam table without difficulty Psychological: alert and cooperative; normal mood and affect  Labs Reviewed  POCT URINALYSIS DIP (MANUAL ENTRY) -  Abnormal; Notable for the following components:      Result Value   Ketones, POC UA trace (5) (*)    Spec Grav, UA >=1.030 (*)    Protein Ur, POC trace (*)    Urobilinogen, UA negative (*)    All other components within normal limits  URINE CULTURE    ASSESSMENT & PLAN:  1. Dysuria   2. Nausea without vomiting     Meds ordered this encounter  Medications   phenazopyridine (PYRIDIUM) 100 MG tablet    Sig: Take 1 tablet (100 mg total) by mouth 3 (three) times daily as needed for pain.    Dispense:  10 tablet    Refill:  0   ondansetron (ZOFRAN) 4 MG tablet    Sig: Take 1 tablet (4 mg total) by mouth every 8 (eight) hours as needed  for nausea or vomiting.    Dispense:  20 tablet    Refill:  0   Discharge instructions  Urine culture sent.  We will call you with the results.   Push fluids and get plenty of rest.   Zofran was prescribed for nausea Take pyridium as prescribed and as needed for symptomatic relief Follow up with PCP if symptoms persists Return here or go to ER if you have any new or worsening symptoms such as fever, worsening abdominal pain, nausea/vomiting, flank pain, etc...  Outlined signs and symptoms indicating need for more acute intervention. Patient verbalized understanding. After Visit Summary given.      Durward Parcel, FNP 12/03/22 1438

## 2022-12-05 LAB — URINE CULTURE: Culture: NO GROWTH

## 2022-12-07 ENCOUNTER — Telehealth (HOSPITAL_COMMUNITY): Payer: Self-pay | Admitting: Emergency Medicine

## 2022-12-07 NOTE — Telephone Encounter (Signed)
Patient called looking for the results of his recent urine culture.  He was adamant he needs antibiotics.  I explained the negative culture and what follow up should be (PCP and possibly urology), and he verbalized understanding

## 2022-12-14 DIAGNOSIS — R350 Frequency of micturition: Secondary | ICD-10-CM | POA: Diagnosis not present

## 2022-12-21 ENCOUNTER — Other Ambulatory Visit: Payer: Self-pay | Admitting: Physician Assistant

## 2023-01-13 DIAGNOSIS — E1165 Type 2 diabetes mellitus with hyperglycemia: Secondary | ICD-10-CM | POA: Diagnosis not present

## 2023-01-20 DIAGNOSIS — I2510 Atherosclerotic heart disease of native coronary artery without angina pectoris: Secondary | ICD-10-CM | POA: Diagnosis not present

## 2023-01-20 DIAGNOSIS — I252 Old myocardial infarction: Secondary | ICD-10-CM | POA: Diagnosis not present

## 2023-01-20 DIAGNOSIS — I119 Hypertensive heart disease without heart failure: Secondary | ICD-10-CM | POA: Diagnosis not present

## 2023-01-20 DIAGNOSIS — E1165 Type 2 diabetes mellitus with hyperglycemia: Secondary | ICD-10-CM | POA: Diagnosis not present

## 2023-01-20 DIAGNOSIS — Z794 Long term (current) use of insulin: Secondary | ICD-10-CM | POA: Diagnosis not present

## 2023-01-20 DIAGNOSIS — E782 Mixed hyperlipidemia: Secondary | ICD-10-CM | POA: Diagnosis not present

## 2023-03-02 IMAGING — DX DG CHEST 1V PORT
1 series · 1 of 1 positions shown · non-contrast
Comparison: January 16, 2022

CLINICAL DATA: Pneumothorax.

EXAM:
PORTABLE CHEST 1 VIEW

[chest ap]
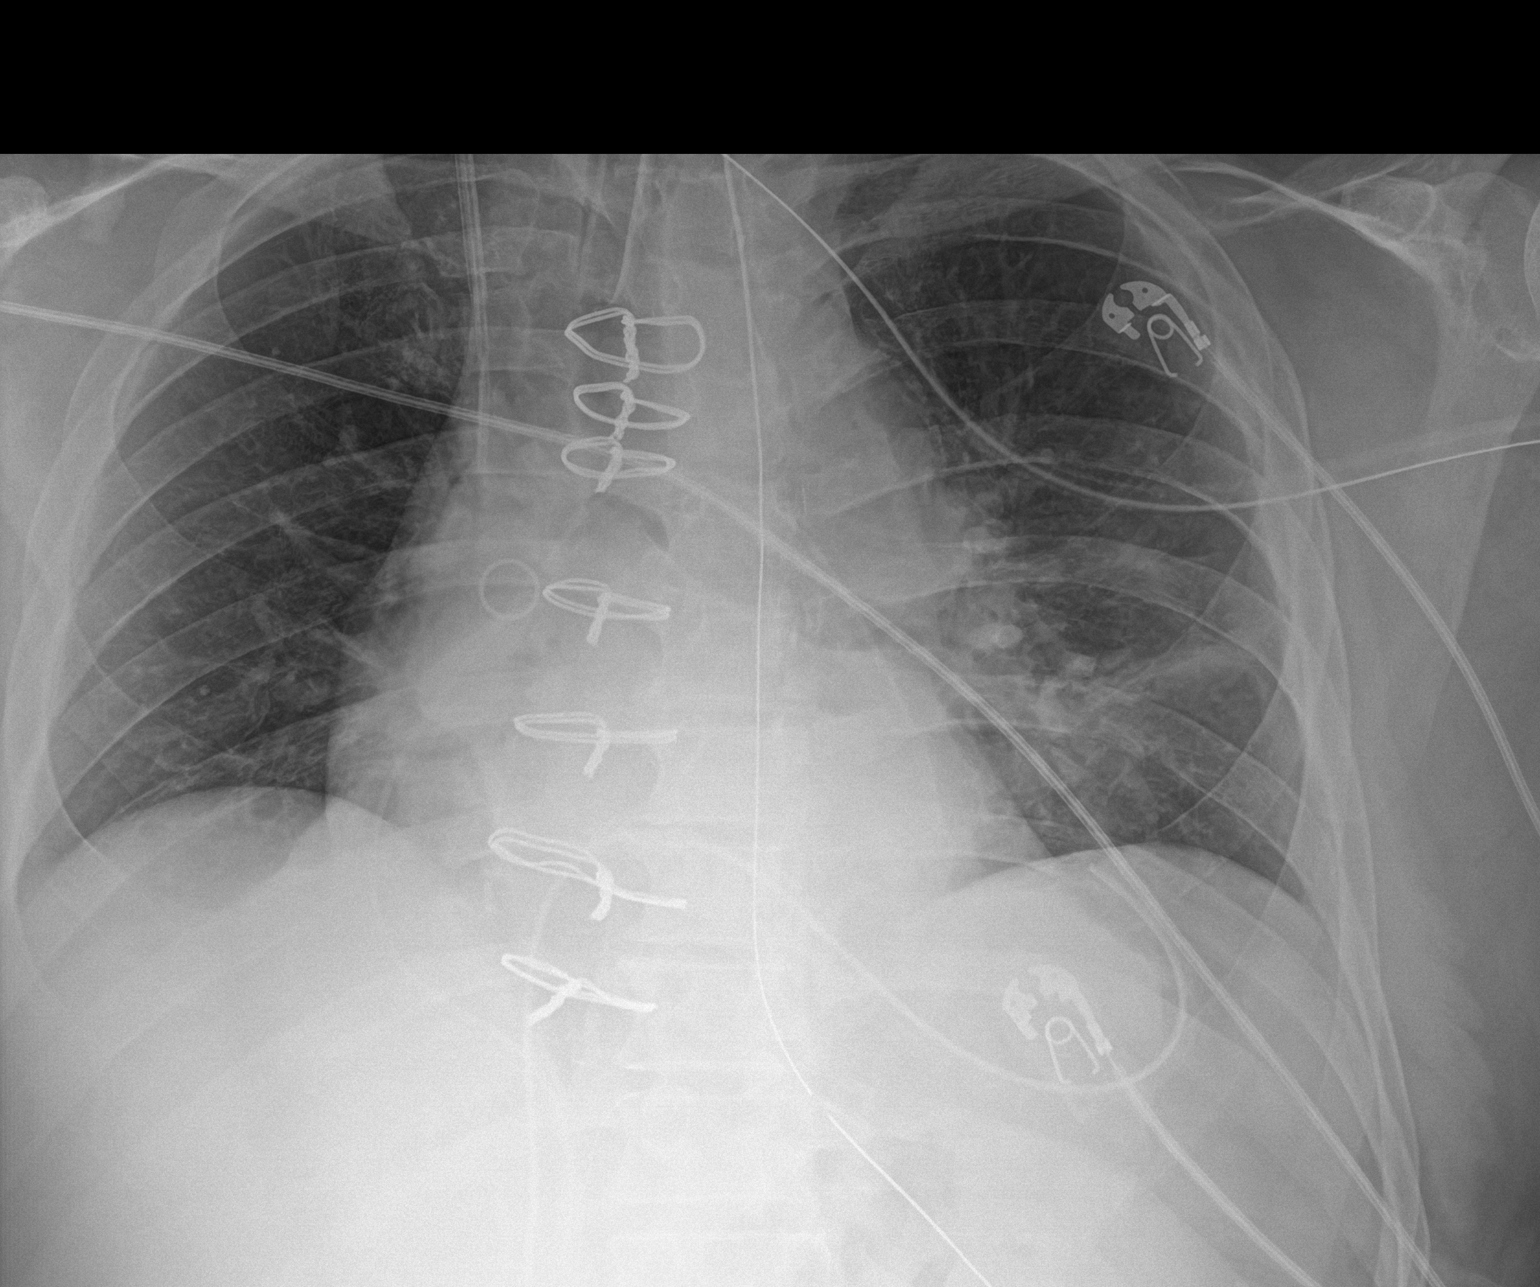

[1 of 1 positions shown; findings below may reference images not displayed]

FINDINGS: Postsurgical changes from CABG.

Endotracheal tube in satisfactory position.

Right-sided central venous catheter terminates in the expected
location distal superior vena cava. Surgical drains in place.
Enteric catheter with side hole just past the GE junction.

Cardiomediastinal silhouette is normal.

Streaky airspace opacities in the left mid lung field. No
radiographically apparent pneumothorax.

Osseous structures are without acute abnormality. Soft tissues are
grossly normal.
IMPRESSION: 1. No radiographically apparent pneumothorax.
2. Streaky airspace opacities in the left mid lung field.
3. Endotracheal tube in satisfactory position.

## 2023-03-03 IMAGING — DX DG CHEST 1V PORT
1 series · 1 of 1 positions shown · non-contrast
Comparison: 01/19/2022

CLINICAL DATA: Chest tube.  Postop open heart surgery

EXAM:
PORTABLE CHEST 1 VIEW

[chest]
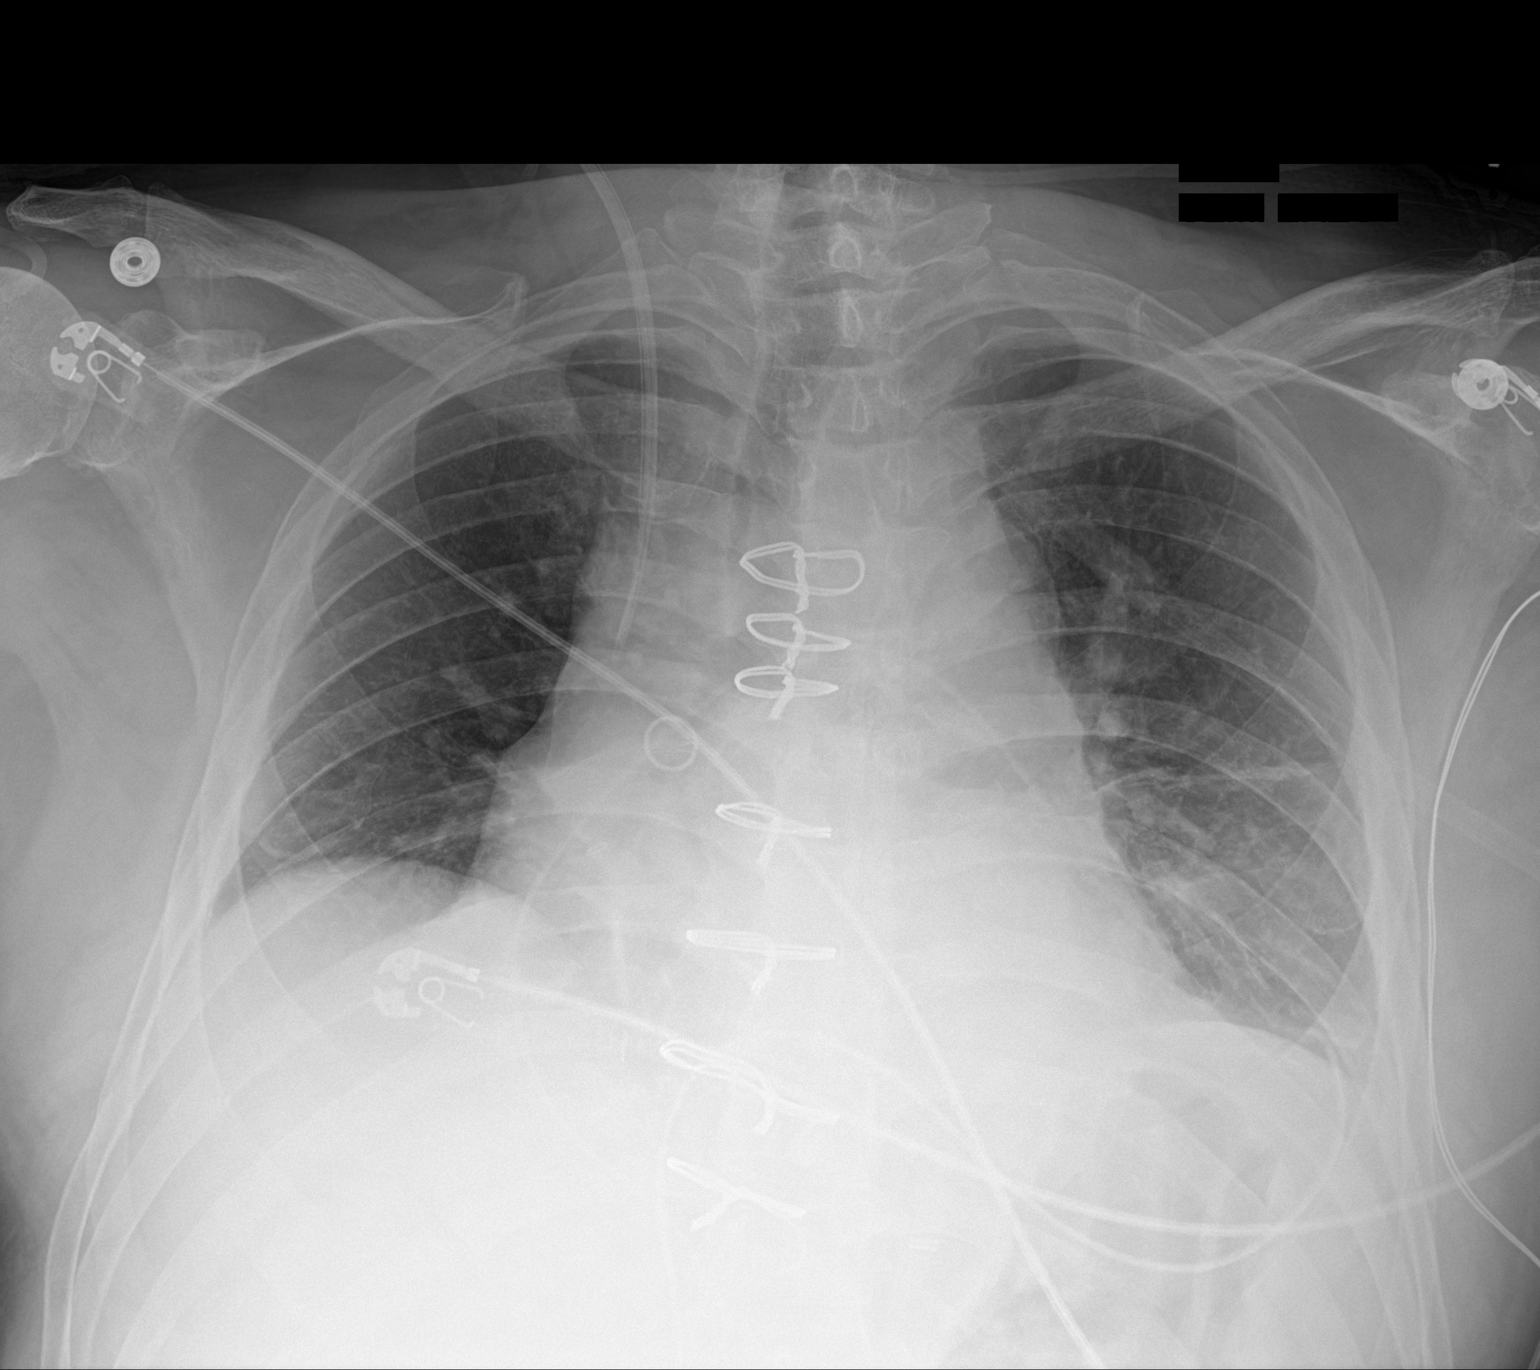

[1 of 1 positions shown; findings below may reference images not displayed]

FINDINGS: Endotracheal tube and NG tube removed

Right jugular central venous catheter tip remains in the SVC. Left
basilar chest tube unchanged in position.

Negative for pneumothorax. Progression of left lower lobe
atelectasis. No significant pleural effusion. Negative for edema.
IMPRESSION: Left chest tube remains in place.  No pneumothorax

Progression of left lower lobe atelectasis.

Endotracheal tube and NG tube removed.

## 2023-04-15 ENCOUNTER — Other Ambulatory Visit: Payer: Self-pay | Admitting: Cardiovascular Disease

## 2023-05-13 ENCOUNTER — Other Ambulatory Visit: Payer: Self-pay | Admitting: Cardiovascular Disease

## 2023-05-29 DIAGNOSIS — E1165 Type 2 diabetes mellitus with hyperglycemia: Secondary | ICD-10-CM | POA: Diagnosis not present

## 2023-06-03 DIAGNOSIS — I2510 Atherosclerotic heart disease of native coronary artery without angina pectoris: Secondary | ICD-10-CM | POA: Diagnosis not present

## 2023-06-03 DIAGNOSIS — I10 Essential (primary) hypertension: Secondary | ICD-10-CM | POA: Diagnosis not present

## 2023-06-03 DIAGNOSIS — Z6838 Body mass index (BMI) 38.0-38.9, adult: Secondary | ICD-10-CM | POA: Diagnosis not present

## 2023-06-03 DIAGNOSIS — E1165 Type 2 diabetes mellitus with hyperglycemia: Secondary | ICD-10-CM | POA: Diagnosis not present

## 2023-06-03 DIAGNOSIS — I252 Old myocardial infarction: Secondary | ICD-10-CM | POA: Diagnosis not present

## 2023-06-03 DIAGNOSIS — E782 Mixed hyperlipidemia: Secondary | ICD-10-CM | POA: Diagnosis not present

## 2023-06-03 DIAGNOSIS — Z951 Presence of aortocoronary bypass graft: Secondary | ICD-10-CM | POA: Diagnosis not present

## 2023-06-03 DIAGNOSIS — Z713 Dietary counseling and surveillance: Secondary | ICD-10-CM | POA: Diagnosis not present

## 2023-06-03 DIAGNOSIS — Z794 Long term (current) use of insulin: Secondary | ICD-10-CM | POA: Diagnosis not present

## 2023-06-03 DIAGNOSIS — Z7182 Exercise counseling: Secondary | ICD-10-CM | POA: Diagnosis not present

## 2023-07-11 ENCOUNTER — Other Ambulatory Visit: Payer: Self-pay | Admitting: Cardiovascular Disease

## 2023-07-25 ENCOUNTER — Ambulatory Visit
Admission: EM | Admit: 2023-07-25 | Discharge: 2023-07-25 | Disposition: A | Discharge status: Home / Self Care | Payer: 59 | Attending: Nurse Practitioner | Admitting: Nurse Practitioner

## 2023-07-25 ENCOUNTER — Encounter: Payer: Self-pay | Admitting: Emergency Medicine

## 2023-07-25 DIAGNOSIS — R059 Cough, unspecified: Secondary | ICD-10-CM | POA: Diagnosis not present

## 2023-07-25 DIAGNOSIS — R198 Other specified symptoms and signs involving the digestive system and abdomen: Secondary | ICD-10-CM | POA: Diagnosis not present

## 2023-07-25 LAB — POCT URINALYSIS DIP (MANUAL ENTRY)
Bilirubin, UA: NEGATIVE
Blood, UA: NEGATIVE
Glucose, UA: >=1000 mg/dL — AB
Leukocytes, UA: NEGATIVE
Nitrite, UA: NEGATIVE
Protein Ur, POC: NEGATIVE mg/dL
Spec Grav, UA: 1.02 (ref 1.010–1.025)
Urobilinogen, UA: 0.2 U/dL
pH, UA: 6 (ref 5.0–8.0)

## 2023-07-25 MED ORDER — ALUM & MAG HYDROXIDE-SIMETH 200-200-20 MG/5ML PO SUSP
30.0000 mL | Freq: Once | ORAL | Status: AC
  Administered 2023-07-25: 30 mL via ORAL

## 2023-07-25 MED ORDER — LIDOCAINE VISCOUS HCL 2 % MT SOLN
15.0000 mL | Freq: Once | OROMUCOSAL | Status: AC
  Administered 2023-07-25: 15 mL via OROMUCOSAL

## 2023-07-25 MED ORDER — AZITHROMYCIN 250 MG PO TABS: 250.0000 mg | ORAL_TABLET | Freq: Every day | ORAL | 0 refills | Status: DC

## 2023-07-25 MED ORDER — PANTOPRAZOLE SODIUM 40 MG PO TBEC: 40.0000 mg | DELAYED_RELEASE_TABLET | Freq: Every day | ORAL | 0 refills | Status: DC

## 2023-07-25 NOTE — ED Provider Notes (Signed)
[facility]    CSN: [id number] Arrival date & time: 07/25/23  1809      History   Chief Complaint No chief complaint on file.   HPI Jacob Acosta is a 62 y.o. male.   The history is provided by the patient.   Patient presents for complaints of abdominal pain that been present for the past 4 days.  Patient complains of belching, bloating, flatulence, and acidic taste in his mouth, and nausea.  Patient reports that the pain is in the middle of his abdomen, and goes up through his middle chest towards his throat.  He denies fever, upper respiratory symptoms, vomiting, diarrhea, bloody stools, or rectal pain.  Patient reports that his last bowel movement was 1 day ago.  Patient reports that he had an episode with reflux several years ago.  He reports that he is diabetic.  Patient has taken Mylanta for his symptoms with minimal relief.  Patient also complains of chest congestion that is been present for more than 1 month.  Patient states that he has had the cough for quite some time.  He denies fever, chills, wheezing, difficulty breathing, nasal congestion or runny nose.  Patient reports that he has taken over-the-counter medications with minimal relief of his symptoms. Past Medical History:  Diagnosis Date   Anxiety    Diabetes mellitus without complication (HCC)    High cholesterol     Patient Active Problem List   Diagnosis Date Noted   Severe obesity (BMI 35.0-39.9) with comorbidity (HCC) 03/16/2022   Type II diabetes mellitus with complication (HCC) 03/16/2022   Hypercholesterolemia 03/16/2022   Coronary artery disease 01/19/2022   NSTEMI (non-ST elevated myocardial infarction) (HCC) 01/17/2022   ACS (acute coronary syndrome) (HCC) 01/16/2022   Personal history of noncompliance with medical treatment, presenting hazards to health 12/13/2017   Uncontrolled type 2 diabetes mellitus with hyperglycemia (HCC) 08/30/2017   Mixed hyperlipidemia 08/30/2017   Physical  exam, annual 01/28/2013   Overweight(278.02) 01/28/2013   History of nephrolithiasis 01/28/2013   Tendonitis of ankle or foot 01/28/2013    Past Surgical History:  Procedure Laterality Date   CORONARY ARTERY BYPASS GRAFT N/A 01/19/2022   Procedure: CORONARY ARTERY BYPASS GRAFTING (CABG), ON PUMP, TIMES THREE USING LEFT INTERNAL MAMMARY ARTERY AND RIGHT ENDOSCOPICALLY HARVESTED GREATER SAPHENOUS VEIN;  Surgeon: Corliss Skains, MD;  Location: [facility] OR;  Service: Open Heart Surgery;  Laterality: N/A;  left radial artery harvest   dental sugery     at age 76   LEFT HEART CATH AND CORONARY ANGIOGRAPHY N/A 01/17/2022   Procedure: LEFT HEART CATH AND CORONARY ANGIOGRAPHY;  Surgeon: Yvonne Kendall, MD;  Location: [facility];  Service: Cardiovascular;  Laterality: N/A;   RADIAL ARTERY HARVEST Left 01/19/2022   Procedure: RADIAL ARTERY HARVEST;  Surgeon: Corliss Skains, MD;  Location: [facility] OR;  Service: Open Heart Surgery;  Laterality: Left;   TEE WITHOUT CARDIOVERSION N/A 01/19/2022   Procedure: TRANSESOPHAGEAL ECHOCARDIOGRAM (TEE);  Surgeon: Corliss Skains, MD;  Location: [facility];  Service: Open Heart Surgery;  Laterality: N/A;   TONSILLECTOMY  1980       Home Medications    Prior to Admission medications   Medication Sig Start Date End Date Taking? Authorizing Provider  azithromycin (ZITHROMAX) 250 MG tablet Take 1 tablet (250 mg total) by mouth daily. Take first 2 tablets together, then 1 every day until finished. 07/25/23  Yes Zeniah Briney-Warren, Sadie Haber, NP  pantoprazole (PROTONIX) 40 MG tablet  Take 1 tablet (40 mg total) by mouth daily. 07/25/23 08/24/23 Yes Rashmi Tallent-Warren, Sadie Haber, NP  aspirin 81 MG tablet Take 1 tablet (81 mg total) by mouth daily. 01/23/22   Barrett, Erin R, PA-C  ezetimibe (ZETIA) 10 MG tablet TAKE 1 TABLET BY MOUTH EVERY DAY 07/11/23   Croitoru, Mihai, MD  Insulin Glargine (BASAGLAR KWIKPEN) 100 UNIT/ML Inject 33 Units into the skin in the morning and at  bedtime.    [provider]  metoprolol tartrate (LOPRESSOR) 25 MG tablet TAKE 1 TABLET BY MOUTH TWICE A DAY 07/11/23   Croitoru, Mihai, MD  Multiple Vitamins-Minerals (CENTRUM SILVER 50+MEN PO) Take 1 tablet by mouth daily.    [provider]  ondansetron (ZOFRAN) 4 MG tablet Take 1 tablet (4 mg total) by mouth every 8 (eight) hours as needed for nausea or vomiting. 12/03/22   Avegno, Zachery Dakins, FNP  phenazopyridine (PYRIDIUM) 100 MG tablet Take 1 tablet (100 mg total) by mouth 3 (three) times daily as needed for pain. 12/03/22   Avegno, Zachery Dakins, FNP  rosuvastatin (CRESTOR) 40 MG tablet TAKE 1 TABLET BY MOUTH EVERY DAY 05/15/23   Croitoru, Mihai, MD  vitamin C (ASCORBIC ACID) 500 MG tablet Take 1,000 mg by mouth daily.    [provider]    Family History Family History  Problem Relation Age of Onset   Anxiety disorder Mother    Depression Father    Alcohol abuse Father     Social History Social History   Tobacco Use   Smoking status: Former    Types: Cigarettes   Smokeless tobacco: Never   Tobacco comments:    only smoked occasionally  Vaping Use   Vaping status: Never Used  Substance Use Topics   Alcohol use: No   Drug use: No     Allergies   Ciprofloxacin hcl, Codeine, Metformin and related, Penicillins, and Toujeo max solostar [insulin glargine]   Review of Systems Review of Systems Per HPI  Physical Exam Triage Vital Signs ED Triage Vitals [07/25/23 1840]  Encounter Vitals Group     BP (!) 164/81     Systolic BP Percentile      Diastolic BP Percentile      Pulse Rate 87     Resp 18     Temp 97.6 F (36.4 C)     Temp Source Oral     SpO2 95 %     Weight      Height      Head Circumference      Peak Flow      Pain Score 6     Pain Loc      Pain Education      Exclude from Growth Chart    No data found.  Updated Vital Signs BP (!) 164/81 (BP Location: Right Arm)   Pulse 87   Temp 97.6 F (36.4 C) (Oral)   Resp 18    SpO2 95%   Visual Acuity Right Eye Distance:   Left Eye Distance:   Bilateral Distance:    Right Eye Near:   Left Eye Near:    Bilateral Near:     Physical Exam Vitals and nursing note reviewed.  Constitutional:      General: He is not in acute distress.    Appearance: Normal appearance.  HENT:     Head: Normocephalic.     Nose: Nose normal.     Mouth/Throat:     Mouth: Mucous membranes are moist.  Pharynx: Posterior oropharyngeal erythema present.     Comments: Cobblestoning present to posterior oropharynx Eyes:     Extraocular Movements: Extraocular movements intact.     Pupils: Pupils are equal, round, and reactive to light.  Cardiovascular:     Rate and Rhythm: Normal rate and regular rhythm.     Pulses: Normal pulses.     Heart sounds: Normal heart sounds.  Pulmonary:     Effort: Pulmonary effort is normal.     Breath sounds: Normal breath sounds.  Abdominal:     General: Abdomen is protuberant. Bowel sounds are normal.     Palpations: Abdomen is soft.     Tenderness: There is abdominal tenderness in the right upper quadrant and epigastric area.  Musculoskeletal:     Cervical back: Normal range of motion.  Skin:    General: Skin is warm and dry.  Neurological:     General: No focal deficit present.     Mental Status: He is alert and oriented to person, place, and time.  Psychiatric:        Mood and Affect: Mood normal.        Behavior: Behavior normal.      UC Treatments / Results  Labs (all labs ordered are listed, but only abnormal results are displayed) Labs Reviewed  POCT URINALYSIS DIP (MANUAL ENTRY) - Abnormal; Notable for the following components:      Result Value   Glucose, UA >=1,000 (*)    Ketones, POC UA small (15) (*)    All other components within normal limits    EKG   Radiology No results found.  Procedures Procedures (including critical care time)  Medications Ordered in UC Medications  alum & mag hydroxide-simeth  (MAALOX/MYLANTA) 200-200-20 MG/5ML suspension 30 mL (30 mLs Oral Given 07/25/23 1857)  lidocaine (XYLOCAINE) 2 % viscous mouth solution 15 mL (15 mLs Mouth/Throat Given 07/25/23 1857)    Initial Impression / Assessment and Plan / UC Course  I have reviewed the triage vital signs and the nursing notes.  Pertinent labs & imaging results that were available during my care of the patient were reviewed by me and considered in my medical decision making (see chart for details).  The patient is well-appearing, he is in no acute distress, vital signs are stable  Urinalysis does not show signs of obvious infection.  Symptoms appear to be consistent with reflux symptoms.  GI cocktail was administered in the clinic with good improvement of his abdominal pain.  Patient also complains of chest congestion that is been present for more than 1 month.  Will start patient on Protonix 40 mg daily for his reflux symptoms, and for the chest congestion and cough, azithromycin 250 mg was prescribed..  Supportive care recommendations were provided and discussed with the patient to include over-the-counter analgesics for pain or discomfort, avoiding triggers for reflux, avoiding eating late at night, and allowing at least 2 to 3 hours before bedtime.  Patient was advised that if he experiences worsening abdominal pain, with fever, vomiting, diarrhea, or other concerns, it is recommended that he follow-up in the emergency department for further evaluation.  Patient was also advised to follow-up with his PCP if symptoms fail to improve with this treatment.  Patient is in agreement with this plan of care and verbalizes understanding.  All questions were answered.  Patient stable for discharge.   Final Clinical Impressions(s) / UC Diagnoses   Final diagnoses:  Symptoms of gastroesophageal reflux  Cough, unspecified type  Discharge Instructions      Take medication as prescribed. May take over-the-counter Tylenol as  needed for pain, fever, or general discomfort. I am providing you with information of foods that you will need to avoid that may trigger your symptoms. Increase fluids and allow for plenty of rest. Continue a bland diet until nausea improves. Avoid eating late and going to bed immediately.  You should eat at least 2 to 3 hours before bedtime. Make sure you are chewing your food well before swallowing. Avoid foods such as red meats, tomato-based foods, spicy foods, caffeine, dairy, or mint based foods. If you experience worsening symptoms with new symptoms of fever, vomiting, or other concerns, please go to the emergency department immediately for further evaluation. If symptoms fail to improve with this treatment, please follow-up with your primary care physician. Follow-up as needed.     ED Prescriptions     Medication Sig Dispense Auth. Provider   pantoprazole (PROTONIX) 40 MG tablet Take 1 tablet (40 mg total) by mouth daily. 30 tablet [employer]i [employer]-Warren, Sadie Haber, NP   azithromycin (ZITHROMAX) 250 MG tablet Take 1 tablet (250 mg total) by mouth daily. Take first 2 tablets together, then 1 every day until finished. 6 tablet Amariya Liskey-Warren, Sadie Haber, NP      PDMP not reviewed this encounter.   Abran Cantor, NP 07/25/23 1912

## 2023-07-25 NOTE — Discharge Instructions (Addendum)
Take medication as prescribed. May take over-the-counter Tylenol as needed for pain, fever, or general discomfort. I am providing you with information of foods that you will need to avoid that may trigger your symptoms. Increase fluids and allow for plenty of rest. Continue a bland diet until nausea improves. Avoid eating late and going to bed immediately.  You should eat at least 2 to 3 hours before bedtime. Make sure you are chewing your food well before swallowing. Avoid foods such as red meats, tomato-based foods, spicy foods, caffeine, dairy, or mint based foods. If you experience worsening symptoms with new symptoms of fever, vomiting, or other concerns, please go to the emergency department immediately for further evaluation. If symptoms fail to improve with this treatment, please follow-up with your primary care physician. Follow-up as needed.

## 2023-07-25 NOTE — ED Triage Notes (Signed)
ABD pain since Saturday.  States unable to eat anything solid.  Feels nauseated at times.  Has been taking Mylanta.

## 2023-07-26 ENCOUNTER — Emergency Department (HOSPITAL_COMMUNITY): Payer: 59

## 2023-07-26 ENCOUNTER — Other Ambulatory Visit: Payer: Self-pay

## 2023-07-26 ENCOUNTER — Inpatient Hospital Stay (HOSPITAL_COMMUNITY)
Admission: EM | Admit: 2023-07-26 | Discharge: 2023-07-31 | DRG: 418 | Disposition: A | Discharge status: Home / Self Care | Payer: 59 | Attending: Internal Medicine | Admitting: Internal Medicine

## 2023-07-26 ENCOUNTER — Emergency Department (HOSPITAL_COMMUNITY)
Admission: EM | Admit: 2023-07-26 | Discharge: 2023-07-26 | Disposition: A | Discharge status: Home / Self Care | Payer: 59 | Source: Home / Self Care | Attending: Emergency Medicine | Admitting: Emergency Medicine

## 2023-07-26 DIAGNOSIS — Z885 Allergy status to narcotic agent status: Secondary | ICD-10-CM | POA: Diagnosis not present

## 2023-07-26 DIAGNOSIS — R079 Chest pain, unspecified: Secondary | ICD-10-CM | POA: Insufficient documentation

## 2023-07-26 DIAGNOSIS — Z818 Family history of other mental and behavioral disorders: Secondary | ICD-10-CM | POA: Diagnosis not present

## 2023-07-26 DIAGNOSIS — K219 Gastro-esophageal reflux disease without esophagitis: Secondary | ICD-10-CM | POA: Diagnosis not present

## 2023-07-26 DIAGNOSIS — K660 Peritoneal adhesions (postprocedural) (postinfection): Secondary | ICD-10-CM | POA: Diagnosis present

## 2023-07-26 DIAGNOSIS — J984 Other disorders of lung: Secondary | ICD-10-CM | POA: Diagnosis not present

## 2023-07-26 DIAGNOSIS — K9189 Other postprocedural complications and disorders of digestive system: Secondary | ICD-10-CM | POA: Diagnosis not present

## 2023-07-26 DIAGNOSIS — Z881 Allergy status to other antibiotic agents status: Secondary | ICD-10-CM | POA: Diagnosis not present

## 2023-07-26 DIAGNOSIS — Z87891 Personal history of nicotine dependence: Secondary | ICD-10-CM | POA: Insufficient documentation

## 2023-07-26 DIAGNOSIS — K810 Acute cholecystitis: Principal | ICD-10-CM | POA: Diagnosis present

## 2023-07-26 DIAGNOSIS — Z7982 Long term (current) use of aspirin: Secondary | ICD-10-CM | POA: Diagnosis not present

## 2023-07-26 DIAGNOSIS — R0789 Other chest pain: Secondary | ICD-10-CM | POA: Diagnosis not present

## 2023-07-26 DIAGNOSIS — R918 Other nonspecific abnormal finding of lung field: Secondary | ICD-10-CM | POA: Diagnosis not present

## 2023-07-26 DIAGNOSIS — I25119 Atherosclerotic heart disease of native coronary artery with unspecified angina pectoris: Secondary | ICD-10-CM | POA: Diagnosis not present

## 2023-07-26 DIAGNOSIS — K828 Other specified diseases of gallbladder: Secondary | ICD-10-CM | POA: Diagnosis not present

## 2023-07-26 DIAGNOSIS — K820 Obstruction of gallbladder: Secondary | ICD-10-CM | POA: Diagnosis present

## 2023-07-26 DIAGNOSIS — E782 Mixed hyperlipidemia: Secondary | ICD-10-CM | POA: Diagnosis not present

## 2023-07-26 DIAGNOSIS — E118 Type 2 diabetes mellitus with unspecified complications: Secondary | ICD-10-CM | POA: Diagnosis present

## 2023-07-26 DIAGNOSIS — K82A1 Gangrene of gallbladder in cholecystitis: Secondary | ICD-10-CM | POA: Diagnosis present

## 2023-07-26 DIAGNOSIS — K8689 Other specified diseases of pancreas: Secondary | ICD-10-CM | POA: Diagnosis not present

## 2023-07-26 DIAGNOSIS — I2510 Atherosclerotic heart disease of native coronary artery without angina pectoris: Secondary | ICD-10-CM | POA: Diagnosis not present

## 2023-07-26 DIAGNOSIS — Z794 Long term (current) use of insulin: Secondary | ICD-10-CM

## 2023-07-26 DIAGNOSIS — Z951 Presence of aortocoronary bypass graft: Secondary | ICD-10-CM | POA: Insufficient documentation

## 2023-07-26 DIAGNOSIS — Z888 Allergy status to other drugs, medicaments and biological substances status: Secondary | ICD-10-CM | POA: Diagnosis not present

## 2023-07-26 DIAGNOSIS — F419 Anxiety disorder, unspecified: Secondary | ICD-10-CM | POA: Diagnosis not present

## 2023-07-26 DIAGNOSIS — Z811 Family history of alcohol abuse and dependence: Secondary | ICD-10-CM

## 2023-07-26 DIAGNOSIS — Z880 Allergy status to penicillin: Secondary | ICD-10-CM | POA: Diagnosis not present

## 2023-07-26 DIAGNOSIS — Z6836 Body mass index (BMI) 36.0-36.9, adult: Secondary | ICD-10-CM | POA: Diagnosis not present

## 2023-07-26 DIAGNOSIS — I252 Old myocardial infarction: Secondary | ICD-10-CM | POA: Diagnosis not present

## 2023-07-26 DIAGNOSIS — E119 Type 2 diabetes mellitus without complications: Secondary | ICD-10-CM | POA: Insufficient documentation

## 2023-07-26 DIAGNOSIS — K567 Ileus, unspecified: Secondary | ICD-10-CM | POA: Diagnosis not present

## 2023-07-26 DIAGNOSIS — R140 Abdominal distension (gaseous): Secondary | ICD-10-CM | POA: Diagnosis not present

## 2023-07-26 DIAGNOSIS — E669 Obesity, unspecified: Secondary | ICD-10-CM | POA: Diagnosis not present

## 2023-07-26 DIAGNOSIS — Z79899 Other long term (current) drug therapy: Secondary | ICD-10-CM | POA: Diagnosis not present

## 2023-07-26 DIAGNOSIS — Z4682 Encounter for fitting and adjustment of non-vascular catheter: Secondary | ICD-10-CM | POA: Diagnosis not present

## 2023-07-26 DIAGNOSIS — R1013 Epigastric pain: Secondary | ICD-10-CM | POA: Diagnosis not present

## 2023-07-26 LAB — CBC WITH DIFFERENTIAL/PLATELET
Abs Immature Granulocytes: 0.12 10*3/uL — ABNORMAL HIGH (ref 0.00–0.07)
Basophils Absolute: 0 10*3/uL (ref 0.0–0.1)
Basophils Relative: 0 %
Eosinophils Absolute: 0 10*3/uL (ref 0.0–0.5)
Eosinophils Relative: 0 %
HCT: 42.8 % (ref 39.0–52.0)
Hemoglobin: 14.5 g/dL (ref 13.0–17.0)
Immature Granulocytes: 1 %
Lymphocytes Relative: 4 %
Lymphs Abs: 0.6 10*3/uL — ABNORMAL LOW (ref 0.7–4.0)
MCH: 29.1 pg (ref 26.0–34.0)
MCHC: 33.9 g/dL (ref 30.0–36.0)
MCV: 85.9 fL (ref 80.0–100.0)
Monocytes Absolute: 1.6 10*3/uL — ABNORMAL HIGH (ref 0.1–1.0)
Monocytes Relative: 9 %
Neutro Abs: 15.9 10*3/uL — ABNORMAL HIGH (ref 1.7–7.7)
Neutrophils Relative %: 86 %
Platelets: 265 10*3/uL (ref 150–400)
RBC: 4.98 MIL/uL (ref 4.22–5.81)
RDW: 13 % (ref 11.5–15.5)
WBC: 18.2 10*3/uL — ABNORMAL HIGH (ref 4.0–10.5)
nRBC: 0 % (ref 0.0–0.2)

## 2023-07-26 LAB — LIPASE, BLOOD
Lipase: 17 U/L (ref 11–51)
Lipase: 17 U/L (ref 11–51)

## 2023-07-26 LAB — CBC
HCT: 39.9 % (ref 39.0–52.0)
Hemoglobin: 13.5 g/dL (ref 13.0–17.0)
MCH: 29.3 pg (ref 26.0–34.0)
MCHC: 33.8 g/dL (ref 30.0–36.0)
MCV: 86.6 fL (ref 80.0–100.0)
Platelets: 228 10*3/uL (ref 150–400)
RBC: 4.61 MIL/uL (ref 4.22–5.81)
RDW: 13 % (ref 11.5–15.5)
WBC: 10.9 10*3/uL — ABNORMAL HIGH (ref 4.0–10.5)
nRBC: 0 % (ref 0.0–0.2)

## 2023-07-26 LAB — URINALYSIS, ROUTINE W REFLEX MICROSCOPIC
Bilirubin Urine: NEGATIVE
Glucose, UA: >=500 mg/dL — AB
Hgb urine dipstick: NEGATIVE
Ketones, ur: 80 mg/dL — AB
Leukocytes,Ua: NEGATIVE
Nitrite: NEGATIVE
Protein, ur: 100 mg/dL — AB
Specific Gravity, Urine: 1.039 — ABNORMAL HIGH (ref 1.005–1.030)
pH: 5 (ref 5.0–8.0)

## 2023-07-26 LAB — COMPREHENSIVE METABOLIC PANEL
ALT: 22 U/L (ref 0–44)
AST: 18 U/L (ref 15–41)
Albumin: 3.5 g/dL (ref 3.5–5.0)
Alkaline Phosphatase: 93 U/L (ref 38–126)
Anion gap: 14 (ref 5–15)
BUN: 15 mg/dL (ref 8–23)
CO2: 25 mmol/L (ref 22–32)
Calcium: 9 mg/dL (ref 8.9–10.3)
Chloride: 94 mmol/L — ABNORMAL LOW (ref 98–111)
Creatinine, Ser: 0.71 mg/dL (ref 0.61–1.24)
GFR, Estimated: >60 mL/min (ref >60)
Glucose, Bld: 221 mg/dL — ABNORMAL HIGH (ref 70–99)
Potassium: 3.7 mmol/L (ref 3.5–5.1)
Sodium: 133 mmol/L — ABNORMAL LOW (ref 135–145)
Total Bilirubin: 1.7 mg/dL — ABNORMAL HIGH (ref 0.3–1.2)
Total Protein: 7.3 g/dL (ref 6.5–8.1)

## 2023-07-26 LAB — BASIC METABOLIC PANEL
Anion gap: 7 (ref 5–15)
BUN: 12 mg/dL (ref 8–23)
CO2: 26 mmol/L (ref 22–32)
Calcium: 8.6 mg/dL — ABNORMAL LOW (ref 8.9–10.3)
Chloride: 101 mmol/L (ref 98–111)
Creatinine, Ser: 0.7 mg/dL (ref 0.61–1.24)
GFR, Estimated: >60 mL/min (ref >60)
Glucose, Bld: 155 mg/dL — ABNORMAL HIGH (ref 70–99)
Potassium: 3.9 mmol/L (ref 3.5–5.1)
Sodium: 134 mmol/L — ABNORMAL LOW (ref 135–145)

## 2023-07-26 LAB — HEPATIC FUNCTION PANEL
ALT: 21 U/L (ref 0–44)
AST: 16 U/L (ref 15–41)
Albumin: 3.3 g/dL — ABNORMAL LOW (ref 3.5–5.0)
Alkaline Phosphatase: 78 U/L (ref 38–126)
Bilirubin, Direct: 0.2 mg/dL (ref 0.0–0.2)
Indirect Bilirubin: 0.7 mg/dL (ref 0.3–0.9)
Total Bilirubin: 0.9 mg/dL (ref 0.3–1.2)
Total Protein: 6.5 g/dL (ref 6.5–8.1)

## 2023-07-26 LAB — TROPONIN I (HIGH SENSITIVITY)
Troponin I (High Sensitivity): 4 ng/L (ref <18)
Troponin I (High Sensitivity): 9 ng/L (ref <18)

## 2023-07-26 MED ORDER — OXYCODONE HCL 5 MG PO TABS
5.0000 mg | ORAL_TABLET | Freq: Once | ORAL | Status: AC
  Administered 2023-07-26: 5 mg via ORAL
  Filled 2023-07-26: qty 1

## 2023-07-26 MED ORDER — SODIUM CHLORIDE 0.9 % IV SOLN
2.0000 g | INTRAVENOUS | Status: DC
  Administered 2023-07-27: 2 g via INTRAVENOUS
  Filled 2023-07-26: qty 20

## 2023-07-26 MED ORDER — ALUM & MAG HYDROXIDE-SIMETH 200-200-20 MG/5ML PO SUSP: 30.0000 mL | Freq: Once | ORAL | Status: DC

## 2023-07-26 MED ORDER — METRONIDAZOLE 500 MG/100ML IV SOLN
500.0000 mg | Freq: Once | INTRAVENOUS | Status: AC
  Administered 2023-07-26: 500 mg via INTRAVENOUS
  Filled 2023-07-26: qty 100

## 2023-07-26 MED ORDER — ALUM & MAG HYDROXIDE-SIMETH 200-200-20 MG/5ML PO SUSP
15.0000 mL | Freq: Once | ORAL | Status: AC
  Administered 2023-07-26: 15 mL via ORAL
  Filled 2023-07-26: qty 30

## 2023-07-26 MED ORDER — ONDANSETRON 4 MG PO TBDP
4.0000 mg | ORAL_TABLET | Freq: Once | ORAL | Status: AC
  Administered 2023-07-26: 4 mg via ORAL
  Filled 2023-07-26: qty 1

## 2023-07-26 MED ORDER — SODIUM CHLORIDE 0.9 % IV SOLN: INTRAVENOUS | Status: AC

## 2023-07-26 MED ORDER — SUCRALFATE 1 G PO TABS: 1.0000 g | ORAL_TABLET | Freq: Four times a day (QID) | ORAL | 0 refills | Status: AC | PRN

## 2023-07-26 MED ORDER — ONDANSETRON HCL 4 MG/2ML IJ SOLN
4.0000 mg | Freq: Once | INTRAMUSCULAR | Status: AC
  Administered 2023-07-26: 4 mg via INTRAVENOUS
  Filled 2023-07-26: qty 2

## 2023-07-26 MED ORDER — SODIUM CHLORIDE 0.9 % IV SOLN
2.0000 g | Freq: Once | INTRAVENOUS | Status: AC
  Administered 2023-07-26: 2 g via INTRAVENOUS
  Filled 2023-07-26: qty 12.5

## 2023-07-26 MED ORDER — SODIUM CHLORIDE 0.9 % IV BOLUS
1000.0000 mL | Freq: Once | INTRAVENOUS | Status: AC
  Administered 2023-07-26: 1000 mL via INTRAVENOUS

## 2023-07-26 MED ORDER — METHOCARBAMOL 500 MG PO TABS: 500.0000 mg | ORAL_TABLET | Freq: Three times a day (TID) | ORAL | 0 refills | Status: DC | PRN

## 2023-07-26 MED ORDER — IOHEXOL 300 MG/ML  SOLN
100.0000 mL | Freq: Once | INTRAMUSCULAR | Status: AC | PRN
  Administered 2023-07-26: 100 mL via INTRAVENOUS

## 2023-07-26 MED ORDER — MORPHINE SULFATE (PF) 2 MG/ML IV SOLN
2.0000 mg | INTRAVENOUS | Status: DC | PRN
  Administered 2023-07-26: 2 mg via INTRAVENOUS
  Filled 2023-07-26: qty 1

## 2023-07-26 MED ORDER — MORPHINE SULFATE (PF) 4 MG/ML IV SOLN
4.0000 mg | Freq: Once | INTRAVENOUS | Status: AC
  Administered 2023-07-26: 4 mg via INTRAVENOUS
  Filled 2023-07-26: qty 1

## 2023-07-26 NOTE — ED Provider Notes (Signed)
[facility] Emergency Department Provider Note MRN:  [id number]  Arrival date & time: 07/26/23     Chief Complaint   Chest Pain   History of Present Illness   Jacob Acosta is a 62 y.o. year-old male with a history of diabetes, CABG presenting to the ED with chief complaint of chest pain.  Epigastric pain with occasional chest pain for the past 3 days.  Worse when laying flat.  Worse after meals on occasion.  No dizziness or diaphoresis, no nausea vomiting, no trouble breathing, no leg pain or swelling.  Review of Systems  A thorough review of systems was obtained and all systems are negative except as noted in the HPI and PMH.   Patient's Health History    Past Medical History:  Diagnosis Date   Anxiety    Diabetes mellitus without complication (HCC)    High cholesterol     Past Surgical History:  Procedure Laterality Date   CORONARY ARTERY BYPASS GRAFT N/A 01/19/2022   Procedure: CORONARY ARTERY BYPASS GRAFTING (CABG), ON PUMP, TIMES THREE USING LEFT INTERNAL MAMMARY ARTERY AND RIGHT ENDOSCOPICALLY HARVESTED GREATER SAPHENOUS VEIN;  Surgeon: Corliss Skains, MD;  Location: [facility] OR;  Service: Open Heart Surgery;  Laterality: N/A;  left radial artery harvest   dental sugery     at age 1   LEFT HEART CATH AND CORONARY ANGIOGRAPHY N/A 01/17/2022   Procedure: LEFT HEART CATH AND CORONARY ANGIOGRAPHY;  Surgeon: Yvonne Kendall, MD;  Location: [facility];  Service: Cardiovascular;  Laterality: N/A;   RADIAL ARTERY HARVEST Left 01/19/2022   Procedure: RADIAL ARTERY HARVEST;  Surgeon: Corliss Skains, MD;  Location: [facility] OR;  Service: Open Heart Surgery;  Laterality: Left;   TEE WITHOUT CARDIOVERSION N/A 01/19/2022   Procedure: TRANSESOPHAGEAL ECHOCARDIOGRAM (TEE);  Surgeon: Corliss Skains, MD;  Location: [facility] OR;  Service: Open Heart Surgery;  Laterality: N/A;   TONSILLECTOMY  1980    Family History  Problem Relation Age of Onset   Anxiety  disorder Mother    Depression Father    Alcohol abuse Father     Social History   Socioeconomic History   Marital status: Married    Spouse name: kay Folson   Number of children: 0   Years of education: Not on file   Highest education level: Not on file  Occupational History   Occupation: drives delivery truck  Tobacco Use   Smoking status: Former    Types: Cigarettes   Smokeless tobacco: Never   Tobacco comments:    only smoked occasionally  Vaping Use   Vaping status: Never Used  Substance and Sexual Activity   Alcohol use: No   Drug use: No   Sexual activity: Not Currently  Other Topics Concern   Not on file  Social History Narrative   Not on file   Social Determinants of Health   Financial Resource Strain: Not on file  Food Insecurity: Not on file  Transportation Needs: Not on file  Physical Activity: Not on file  Stress: Not on file  Social Connections: Not on file  Intimate Partner Violence: Not on file     Physical Exam   Vitals:   07/26/23 0300 07/26/23 0430  BP:  139/79  Pulse:  84  Resp:  (!) 21  Temp: 98.1 F (36.7 C)   SpO2:  100%    CONSTITUTIONAL: Well-appearing, NAD NEURO/PSYCH:  Alert and oriented x 3, no focal deficits EYES:  eyes equal  and reactive ENT/NECK:  no LAD, no JVD CARDIO: Regular rate, well-perfused, normal S1 and S2 PULM:  CTAB no wheezing or rhonchi GI/GU:  non-distended, non-tender MSK/SPINE:  No gross deformities, no edema SKIN:  no rash, atraumatic   *Additional and/or pertinent findings included in MDM below  Diagnostic and Interventional Summary    EKG Interpretation Date/Time:  Wednesday July 26 2023 02:58:05 EDT Ventricular Rate:  86 PR Interval:  179 QRS Duration:  75 QT Interval:  348 QTC Calculation: 417 R Axis:   59  Text Interpretation: Sinus rhythm Confirmed by Kennis Carina [phone]) on 07/26/2023 3:04:39 AM       Labs Reviewed  BASIC METABOLIC PANEL - Abnormal; Notable for the following  components:      Result Value   Sodium 134 (*)    Glucose, Bld 155 (*)    Calcium 8.6 (*)    All other components within normal limits  CBC - Abnormal; Notable for the following components:   WBC 10.9 (*)    All other components within normal limits  HEPATIC FUNCTION PANEL - Abnormal; Notable for the following components:   Albumin 3.3 (*)    All other components within normal limits  LIPASE, BLOOD  TROPONIN I (HIGH SENSITIVITY)    DG Chest Port 1 View  Final Result      Medications  oxyCODONE (Oxy IR/ROXICODONE) immediate release tablet 5 mg (5 mg Oral Given 07/26/23 0327)  ondansetron (ZOFRAN-ODT) disintegrating tablet 4 mg (4 mg Oral Given 07/26/23 0328)     Procedures  /  Critical Care Procedures  ED Course and Medical Decision Making  Initial Impression and Ddx Patient sitting up in no acute distress, says that when he was initially laying flat in the emergency department bed the pain was worse but it is improved when he is sitting up.  Vitals normal.  Favoring GI etiology.  Also considering ACS, pancreatitis.  Nothing to suggest PE, highly doubt dissection.  MSK is possible.  Past medical/surgical history that increases complexity of ED encounter: CABG  Interpretation of Diagnostics I personally reviewed the EKG and my interpretation is as follows: Sinus rhythm without concerning ischemic features  Labs reassuring with no significant blood count or electrolyte disturbance, troponin negative, lipase normal.  Patient Reassessment and Ultimate Disposition/Management     With pain present for 2 or 3 days no real benefit and repeat troponin.  Patient feeling better, appropriate for discharge.  Patient management required discussion with the following services or consulting groups:  None  Complexity of Problems Addressed Acute illness or injury that poses threat of life of bodily function  Additional Data Reviewed and Analyzed Further history obtained from: Further  history from spouse/family member  Additional Factors Impacting ED Encounter Risk Prescriptions  Elmer Sow. Pilar Plate, MD [facility] Emergency Medicine [facility] [email or web address]  Final Clinical Impressions(s) / ED Diagnoses     ICD-10-CM   1. Chest pain, unspecified type  R07.9       ED Discharge Orders          Ordered    methocarbamol (ROBAXIN) 500 MG tablet  Every 8 hours PRN        07/26/23 0507    sucralfate (CARAFATE) 1 g tablet  4 times daily PRN        07/26/23 0507             Discharge Instructions Discussed with and Provided to Patient:    Discharge Instructions  You were evaluated in the Emergency Department and after careful evaluation, we did not find any emergent condition requiring admission or further testing in the hospital.  Your exam/testing today is overall reassuring.  No signs of heart strain or damage.  Pain may be due to acid reflux or strained muscle.  For the acid reflux continue the Prilosec daily.  You can also try the Carafate up to 4 times daily for immediate acid relief.  For possible muscle strain can continue Tylenol 1000 mg every 4-6 hours.  Can try the muscle relaxers at nighttime.  Please return to the Emergency Department if you experience any worsening of your condition.   Thank you for allowing [place] to be a part of your care.      Sabas Sous, MD 07/26/23 [phone]

## 2023-07-26 NOTE — ED Provider Notes (Signed)
[facility]ERG[facility] DEPARTMENT AT [facility] Provider Note  CSN: [id number] Arrival date & time: 07/26/23 1708  Chief Complaint(s) Abdominal Pain and Back Pain  HPI Jacob Acosta is a 62 y.o. male with past medical history as below, significant for type II DM, CAD, CABG, HLD who presents to the ED with complaint of epigastric, flank pain.  Patient was seen earlier today secondary to concern for chest pain.  Workup at that time was reassuring.  Pain is not improved, now is moved downwards towards his epigastrium and is flank area.  He is having some urgency and dysuria, nausea w/ vomiting (4-5 episodes today).  Difficult finding position of comfort, food makes the symptoms worse and makes him more nauseated.  Similar symptoms in the past.  No excessive NSAID use, distant alcohol abuse history.  No BRBPR or melena.  No longer having any chest pain  Past Medical History Past Medical History:  Diagnosis Date   Anxiety    Diabetes mellitus without complication (HCC)    High cholesterol    Patient Active Problem List   Diagnosis Date Noted   Severe obesity (BMI 35.0-39.9) with comorbidity (HCC) 03/16/2022   Type II diabetes mellitus with complication (HCC) 03/16/2022   Hypercholesterolemia 03/16/2022   Coronary artery disease 01/19/2022   NSTEMI (non-ST elevated myocardial infarction) (HCC) 01/17/2022   ACS (acute coronary syndrome) (HCC) 01/16/2022   Personal history of noncompliance with medical treatment, presenting hazards to health 12/13/2017   Uncontrolled type 2 diabetes mellitus with hyperglycemia (HCC) 08/30/2017   Mixed hyperlipidemia 08/30/2017   Physical exam, annual 01/28/2013   Overweight(278.02) 01/28/2013   History of nephrolithiasis 01/28/2013   Tendonitis of ankle or foot 01/28/2013   Home Medication(s) Prior to Admission medications   Medication Sig Start Date End Date Taking? Authorizing Provider  acetaminophen (TYLENOL) 325 MG tablet Take 650 mg by  mouth every 6 (six) hours as needed for mild pain.   Yes [provider]  aspirin 81 MG tablet Take 1 tablet (81 mg total) by mouth daily. 01/23/22  Yes Barrett, Erin R, PA-C  ezetimibe (ZETIA) 10 MG tablet TAKE 1 TABLET BY MOUTH EVERY DAY 07/11/23  Yes Croitoru, Mihai, MD  ibuprofen (ADVIL) 200 MG tablet Take 200 mg by mouth every 6 (six) hours as needed for mild pain.   Yes [provider]  Insulin Glargine (BASAGLAR KWIKPEN) 100 UNIT/ML Inject 30-34 Units into the skin in the morning and at bedtime.   Yes [provider]  metoprolol tartrate (LOPRESSOR) 25 MG tablet TAKE 1 TABLET BY MOUTH TWICE A DAY 07/11/23  Yes Croitoru, Mihai, MD  Multiple Vitamins-Minerals (CENTRUM SILVER 50+MEN PO) Take 1 tablet by mouth daily.   Yes [provider]  ondansetron (ZOFRAN) 4 MG tablet Take 1 tablet (4 mg total) by mouth every 8 (eight) hours as needed for nausea or vomiting. 12/03/22  Yes Avegno, Zachery Dakins, FNP  pantoprazole (PROTONIX) 40 MG tablet Take 1 tablet (40 mg total) by mouth daily. 07/25/23 08/24/23 Yes Leath-Warren, Sadie Haber, NP  rosuvastatin (CRESTOR) 40 MG tablet TAKE 1 TABLET BY MOUTH EVERY DAY 05/15/23  Yes Croitoru, Mihai, MD  sucralfate (CARAFATE) 1 g tablet Take 1 tablet (1 g total) by mouth 4 (four) times daily as needed. 07/26/23  Yes Sabas Sous, MD  vitamin C (ASCORBIC ACID) 500 MG tablet Take 1,000 mg by mouth daily.   Yes [provider]  Past Surgical History Past Surgical History:  Procedure Laterality Date   CORONARY ARTERY BYPASS GRAFT N/A 01/19/2022   Procedure: CORONARY ARTERY BYPASS GRAFTING (CABG), ON PUMP, TIMES THREE USING LEFT INTERNAL MAMMARY ARTERY AND RIGHT ENDOSCOPICALLY HARVESTED GREATER SAPHENOUS VEIN;  Surgeon: Corliss Skains, MD;  Location: [facility] OR;  Service: Open Heart Surgery;  Laterality:  N/A;  left radial artery harvest   dental sugery     at age 60   LEFT HEART CATH AND CORONARY ANGIOGRAPHY N/A 01/17/2022   Procedure: LEFT HEART CATH AND CORONARY ANGIOGRAPHY;  Surgeon: Yvonne Kendall, MD;  Location: [facility] INVASIVE CV LAB;  Service: Cardiovascular;  Laterality: N/A;   RADIAL ARTERY HARVEST Left 01/19/2022   Procedure: RADIAL ARTERY HARVEST;  Surgeon: Corliss Skains, MD;  Location: [facility] OR;  Service: Open Heart Surgery;  Laterality: Left;   TEE WITHOUT CARDIOVERSION N/A 01/19/2022   Procedure: TRANSESOPHAGEAL ECHOCARDIOGRAM (TEE);  Surgeon: Corliss Skains, MD;  Location: [facility] OR;  Service: Open Heart Surgery;  Laterality: N/A;   TONSILLECTOMY  1980   Family History Family History  Problem Relation Age of Onset   Anxiety disorder Mother    Depression Father    Alcohol abuse Father     Social History Social History   Tobacco Use   Smoking status: Former    Types: Cigarettes   Smokeless tobacco: Never   Tobacco comments:    only smoked occasionally  Vaping Use   Vaping status: Never Used  Substance Use Topics   Alcohol use: No   Drug use: No   Allergies Ciprofloxacin hcl, Codeine, Metformin and related, Penicillins, and Toujeo max solostar [insulin glargine]  Review of Systems Review of Systems  Constitutional:  Negative for chills and fever.  Respiratory:  Negative for apnea and shortness of breath.   Cardiovascular:  Negative for chest pain and palpitations.  Gastrointestinal:  Positive for abdominal pain, nausea and vomiting. Negative for diarrhea.  Genitourinary:  Positive for dysuria, flank pain and urgency.    Physical Exam Vital Signs  I have reviewed the triage vital signs BP (!) 136/95   Pulse (!) 107   Temp 98.8 F (37.1 C) (Oral)   Resp 19   Ht 5\' 7"  (1.702 m)   Wt 104 kg   SpO2 93%   BMI 35.91 kg/m  Physical Exam Vitals and nursing note reviewed.  Constitutional:      General: He is not in acute distress.    Appearance: He is  well-developed.  HENT:     Head: Normocephalic and atraumatic.     Right Ear: External ear normal.     Left Ear: External ear normal.     Mouth/Throat:     Mouth: Mucous membranes are moist.  Eyes:     General: No scleral icterus. Cardiovascular:     Rate and Rhythm: Normal rate and regular rhythm.     Pulses: Normal pulses.     Heart sounds: Normal heart sounds.  Pulmonary:     Effort: Pulmonary effort is normal. No respiratory distress.     Breath sounds: Normal breath sounds.  Abdominal:     General: Abdomen is flat.     Palpations: Abdomen is soft.     Tenderness: There is abdominal tenderness in the right upper quadrant and epigastric area. There is no right CVA tenderness, left CVA tenderness, guarding or rebound.  Musculoskeletal:     Cervical back: No rigidity.     Right lower leg: No edema.  Left lower leg: No edema.  Skin:    General: Skin is warm and dry.     Capillary Refill: Capillary refill takes less than 2 seconds.  Neurological:     Mental Status: He is alert.  Psychiatric:        Mood and Affect: Mood normal.        Behavior: Behavior normal.     ED Results and Treatments Labs (all labs ordered are listed, but only abnormal results are displayed) Labs Reviewed  COMPREHENSIVE METABOLIC PANEL - Abnormal; Notable for the following components:      Result Value   Sodium 133 (*)    Chloride 94 (*)    Glucose, Bld 221 (*)    Total Bilirubin 1.7 (*)    All other components within normal limits  URINALYSIS, ROUTINE W REFLEX MICROSCOPIC - Abnormal; Notable for the following components:   Specific Gravity, Urine 1.039 (*)    Glucose, UA >=500 (*)    Ketones, ur 80 (*)    Protein, ur 100 (*)    Bacteria, UA RARE (*)    All other components within normal limits  CBC WITH DIFFERENTIAL/PLATELET - Abnormal; Notable for the following components:   WBC 18.2 (*)    Neutro Abs 15.9 (*)    Lymphs Abs 0.6 (*)    Monocytes Absolute 1.6 (*)    Abs Immature  Granulocytes 0.12 (*)    All other components within normal limits  LIPASE, BLOOD  TROPONIN I (HIGH SENSITIVITY)                                                                                                                          Radiology CT ABDOMEN PELVIS W CONTRAST  Result Date: 07/26/2023 CLINICAL DATA:  Epigastric pain. EXAM: CT ABDOMEN AND PELVIS WITH CONTRAST TECHNIQUE: Multidetector CT imaging of the abdomen and pelvis was performed using the standard protocol following bolus administration of intravenous contrast. RADIATION DOSE REDUCTION: This exam was performed according to the departmental dose-optimization program which includes automated exposure control, adjustment of the mA and/or kV according to patient size and/or use of iterative reconstruction technique. CONTRAST:  OMNIPAQUE IOHEXOL 300 MG/ML  SOLN COMPARISON:  None Available. FINDINGS: Lower chest: There are bibasilar linear atelectasis/scarring. The visualized lung bases are otherwise clear. There is coronary vascular calcification. No intra-abdominal free air or free fluid. Hepatobiliary: Apparent mild fatty liver. No biliary dilatation. The gallbladder is distended. No calcified gallstone. There is however inflammatory changes of the gallbladder wall and stranding of the pericholecystic fat most consistent with acute cholecystitis. Further evaluation with ultrasound recommended. Pancreas: Moderate pancreatic atrophy. No active inflammatory changes. No dilatation of the main pancreatic duct. Spleen: Normal in size without focal abnormality. Adrenals/Urinary Tract: The adrenal glands unremarkable. There is no hydronephrosis on either side. There is symmetric enhancement and excretion of contrast by both kidneys. The visualized ureters and urinary bladder appear unremarkable. Stomach/Bowel: There is no bowel obstruction or active inflammation. There  is mild distension of the stomach. The appendix is normal. Vascular/Lymphatic:  The abdominal aorta and IVC are unremarkable. No portal venous gas. There is no adenopathy. Reproductive: The prostate and seminal vesicles are grossly unremarkable. No pelvic mass. Other: None Musculoskeletal: No acute or significant osseous findings. IMPRESSION: 1. Findings most consistent with acute cholecystitis. Further evaluation with ultrasound recommended. 2. No bowel obstruction. Normal appendix. Electronically Signed   By: Elgie Collard M.D.   On: 07/26/2023 19:44   DG Chest Port 1 View  Result Date: 07/26/2023 CLINICAL DATA:  Chest pain EXAM: PORTABLE CHEST 1 VIEW COMPARISON:  Chest x-ray 03/01/2022 FINDINGS: Sternotomy wires are present. The heart size and mediastinal contours are within normal limits. Both lungs are clear. The visualized skeletal structures are unremarkable. IMPRESSION: No active disease. Electronically Signed   By: Darliss Cheney M.D.   On: 07/26/2023 03:58    Pertinent labs & imaging results that were available during my care of the patient were reviewed by me and considered in my medical decision making (see MDM for details).  Medications Ordered in ED Medications  0.9 %  sodium chloride infusion ( Intravenous New Bag/Given 07/26/23 2133)  ondansetron (ZOFRAN-ODT) disintegrating tablet 4 mg (4 mg Oral Given 07/26/23 1803)  iohexol (OMNIPAQUE) 300 MG/ML solution 100 mL (100 mLs Intravenous Contrast Given 07/26/23 1818)  morphine (PF) 4 MG/ML injection 4 mg (4 mg Intravenous Given 07/26/23 1928)  ondansetron (ZOFRAN) injection 4 mg (4 mg Intravenous Given 07/26/23 1927)  sodium chloride 0.9 % bolus 1,000 mL (1,000 mLs Intravenous New Bag/Given 07/26/23 1928)  alum & mag hydroxide-simeth (MAALOX/MYLANTA) 200-200-20 MG/5ML suspension 15 mL (15 mLs Oral Given 07/26/23 1928)  ceFEPIme (MAXIPIME) 2 g in sodium chloride 0.9 % 100 mL IVPB (0 g Intravenous Stopped 07/26/23 2133)    And  metroNIDAZOLE (FLAGYL) IVPB 500 mg (500 mg Intravenous New Bag/Given 07/26/23 2031)   ondansetron (ZOFRAN) injection 4 mg (4 mg Intravenous Given 07/26/23 2133)                                                                                                                                     Procedures Procedures  (including critical care time)  Medical Decision Making / ED Course    Medical Decision Making:    CLEMONS STEPLER is a 62 y.o. male  with past medical history as below, significant for type II DM, CAD, CABG, HLD who presents to the ED with complaint of epigastric, flank pain.. The complaint involves an extensive differential diagnosis and also carries with it a high risk of complications and morbidity.  Serious etiology was considered. Ddx includes but is not limited to: Differential diagnosis includes but is not exclusive to acute cholecystitis, intrathoracic causes for epigastric abdominal pain, gastritis, duodenitis, pancreatitis, small bowel or large bowel obstruction, abdominal aortic aneurysm, hernia, gastritis, etc.   Complete initial physical exam performed, notably the patient  was no  acute distress, tachycardic initially but has improved..    Reviewed and confirmed nursing documentation for past medical history, family history, social history.  Vital signs reviewed.    Clinical Course as of 07/26/23 2139  Wed Jul 26, 2023  2011 CTAP with acute cholecystitis, start abx, will d/w gen surg and plan for admission  [SG]  2021 His bilirubin is mildly elevated 1.7, liver enzymes and alk phos are normal.  He is not febrile, he is not jaundiced.  He has white count 18.2.  Tachycardia.  Continue IV fluids, give cefepime Flagyl for antibiotic protocol, plan admission [SG]  2049 WBC(!): 18.2 [SG]  2050 Total Bilirubin(!): 1.7 [SG]  2117 Spoke with Dr. Henreitta Leber, agree with plan, recommend n.p.o. at midnight.  Okay to stay here for surgical evaluation in the morning [SG]    Clinical Course User Index [SG] Tanda Rockers A, DO      Acute cholecystitis, admit to  medicine, surgery to see in the morning                 Additional history obtained: -Additional history obtained from spouse -External records from outside source obtained and reviewed including: Chart review including previous notes, labs, imaging, consultation notes including  Recent ER visit, prior labs and imaging, home medications   Lab Tests: -I ordered, reviewed, and interpreted labs.   The pertinent results include:   Labs Reviewed  COMPREHENSIVE METABOLIC PANEL - Abnormal; Notable for the following components:      Result Value   Sodium 133 (*)    Chloride 94 (*)    Glucose, Bld 221 (*)    Total Bilirubin 1.7 (*)    All other components within normal limits  URINALYSIS, ROUTINE W REFLEX MICROSCOPIC - Abnormal; Notable for the following components:   Specific Gravity, Urine 1.039 (*)    Glucose, UA >=500 (*)    Ketones, ur 80 (*)    Protein, ur 100 (*)    Bacteria, UA RARE (*)    All other components within normal limits  CBC WITH DIFFERENTIAL/PLATELET - Abnormal; Notable for the following components:   WBC 18.2 (*)    Neutro Abs 15.9 (*)    Lymphs Abs 0.6 (*)    Monocytes Absolute 1.6 (*)    Abs Immature Granulocytes 0.12 (*)    All other components within normal limits  LIPASE, BLOOD  TROPONIN I (HIGH SENSITIVITY)    Notable for as above, leukocytosis, elevated T. bili  EKG   EKG Interpretation Date/Time:    Ventricular Rate:    PR Interval:    QRS Duration:    QT Interval:    QTC Calculation:   R Axis:      Text Interpretation:           Imaging Studies ordered: I ordered imaging studies including CT abdomen I independently visualized the following imaging with scope of interpretation limited to determining acute life threatening conditions related to emergency care; findings noted above, significant for acute cholecystitis I independently visualized and interpreted imaging. I agree with the radiologist  interpretation   Medicines ordered and prescription drug management: Meds ordered this encounter  Medications   ondansetron (ZOFRAN-ODT) disintegrating tablet 4 mg   iohexol (OMNIPAQUE) 300 MG/ML solution 100 mL   morphine (PF) 4 MG/ML injection 4 mg   ondansetron (ZOFRAN) injection 4 mg   sodium chloride 0.9 % bolus 1,000 mL   alum & mag hydroxide-simeth (MAALOX/MYLANTA) 200-200-20 MG/5ML suspension 15 mL   AND Linked  Order Group    ceFEPIme (MAXIPIME) 2 g in sodium chloride 0.9 % 100 mL IVPB     Order Specific Question:   Antibiotic Indication:     Answer:   Other Indication (list below)     Order Specific Question:   Other Indication:     Answer:   Intra-abdominal Infection    metroNIDAZOLE (FLAGYL) IVPB 500 mg     Order Specific Question:   Antibiotic Indication:     Answer:   Intra-abdominal Infection   0.9 %  sodium chloride infusion   ondansetron (ZOFRAN) injection 4 mg    -I have reviewed the patients home medicines and have made adjustments as needed   Consultations Obtained: I requested consultation with the gen surg,  and discussed lab and imaging findings as well as pertinent plan - they recommend: see in AM   Cardiac Monitoring: The patient was maintained on a cardiac monitor.  I personally viewed and interpreted the cardiac monitored which showed an underlying rhythm of: sinus tachy > improving   Social Determinants of Health:  Diagnosis or treatment significantly limited by social determinants of health: former smoker and obesity   Reevaluation: After the interventions noted above, I reevaluated the patient and found that they have improved  Co morbidities that complicate the patient evaluation  Past Medical History:  Diagnosis Date   Anxiety    Diabetes mellitus without complication (HCC)    High cholesterol       Dispostion: Disposition decision including need for hospitalization was considered, and patient admitted to the hospital.    Final  Clinical Impression(s) / ED Diagnoses Final diagnoses:  Acute cholecystitis        Sloan Leiter, DO 07/26/23 2139

## 2023-07-26 NOTE — Discharge Instructions (Addendum)
You were evaluated in the Emergency Department and after careful evaluation, we did not find any emergent condition requiring admission or further testing in the hospital.  Your exam/testing today is overall reassuring.  No signs of heart strain or damage.  Pain may be due to acid reflux or strained muscle.  For the acid reflux continue the Prilosec daily.  You can also try the Carafate up to 4 times daily for immediate acid relief.  For possible muscle strain can continue Tylenol 1000 mg every 4-6 hours.  Can try the muscle relaxers at nighttime.  Please return to the Emergency Department if you experience any worsening of your condition.   Thank you for allowing [place] to be a part of your care.

## 2023-07-26 NOTE — ED Triage Notes (Signed)
Pt complains of RUQ pain and back pain for several days. Pt was seen yesterday because pain was in chest. Denies any chest discomfort at this time. States abd swelling, burning with urination and vomiting. Pt would like to be evaluated for kidney or gallstones.

## 2023-07-26 NOTE — ED Triage Notes (Signed)
Pt c/o centralized chest pain x2 days. States the pain is constant and worse when laying flat. Hx of triple bypass.

## 2023-07-27 ENCOUNTER — Inpatient Hospital Stay (HOSPITAL_COMMUNITY): Payer: 59 | Admitting: Anesthesiology

## 2023-07-27 ENCOUNTER — Encounter (HOSPITAL_COMMUNITY): Payer: Self-pay | Admitting: Family Medicine

## 2023-07-27 ENCOUNTER — Encounter (HOSPITAL_COMMUNITY)
Admission: EM | Disposition: A | Discharge status: Home / Self Care | Payer: Self-pay | Source: Home / Self Care | Attending: Internal Medicine

## 2023-07-27 ENCOUNTER — Other Ambulatory Visit: Payer: Self-pay

## 2023-07-27 ENCOUNTER — Observation Stay (HOSPITAL_COMMUNITY): Payer: 59

## 2023-07-27 DIAGNOSIS — I25119 Atherosclerotic heart disease of native coronary artery with unspecified angina pectoris: Secondary | ICD-10-CM | POA: Diagnosis not present

## 2023-07-27 DIAGNOSIS — J984 Other disorders of lung: Secondary | ICD-10-CM | POA: Diagnosis not present

## 2023-07-27 DIAGNOSIS — K567 Ileus, unspecified: Secondary | ICD-10-CM | POA: Diagnosis not present

## 2023-07-27 DIAGNOSIS — F419 Anxiety disorder, unspecified: Secondary | ICD-10-CM | POA: Diagnosis not present

## 2023-07-27 DIAGNOSIS — K810 Acute cholecystitis: Secondary | ICD-10-CM

## 2023-07-27 DIAGNOSIS — K219 Gastro-esophageal reflux disease without esophagitis: Secondary | ICD-10-CM

## 2023-07-27 DIAGNOSIS — E782 Mixed hyperlipidemia: Secondary | ICD-10-CM

## 2023-07-27 DIAGNOSIS — R918 Other nonspecific abnormal finding of lung field: Secondary | ICD-10-CM | POA: Diagnosis not present

## 2023-07-27 DIAGNOSIS — Z4682 Encounter for fitting and adjustment of non-vascular catheter: Secondary | ICD-10-CM | POA: Diagnosis not present

## 2023-07-27 DIAGNOSIS — E118 Type 2 diabetes mellitus with unspecified complications: Secondary | ICD-10-CM

## 2023-07-27 LAB — COMPREHENSIVE METABOLIC PANEL
ALT: 22 U/L (ref 0–44)
AST: 23 U/L (ref 15–41)
Albumin: 3.2 g/dL — ABNORMAL LOW (ref 3.5–5.0)
Alkaline Phosphatase: 104 U/L (ref 38–126)
Anion gap: 11 (ref 5–15)
BUN: 16 mg/dL (ref 8–23)
CO2: 24 mmol/L (ref 22–32)
Calcium: 8.6 mg/dL — ABNORMAL LOW (ref 8.9–10.3)
Chloride: 95 mmol/L — ABNORMAL LOW (ref 98–111)
Creatinine, Ser: 0.63 mg/dL (ref 0.61–1.24)
GFR, Estimated: >60 mL/min (ref >60)
Glucose, Bld: 215 mg/dL — ABNORMAL HIGH (ref 70–99)
Potassium: 3.7 mmol/L (ref 3.5–5.1)
Sodium: 130 mmol/L — ABNORMAL LOW (ref 135–145)
Total Bilirubin: 1.2 mg/dL (ref 0.3–1.2)
Total Protein: 6.9 g/dL (ref 6.5–8.1)

## 2023-07-27 LAB — CBC WITH DIFFERENTIAL/PLATELET
Abs Immature Granulocytes: 0.34 10*3/uL — ABNORMAL HIGH (ref 0.00–0.07)
Basophils Absolute: 0 10*3/uL (ref 0.0–0.1)
Basophils Relative: 0 %
Eosinophils Absolute: 0 10*3/uL (ref 0.0–0.5)
Eosinophils Relative: 0 %
HCT: 42.9 % (ref 39.0–52.0)
Hemoglobin: 14.3 g/dL (ref 13.0–17.0)
Immature Granulocytes: 2 %
Lymphocytes Relative: 3 %
Lymphs Abs: 0.8 10*3/uL (ref 0.7–4.0)
MCH: 28.9 pg (ref 26.0–34.0)
MCHC: 33.3 g/dL (ref 30.0–36.0)
MCV: 86.7 fL (ref 80.0–100.0)
Monocytes Absolute: 2 10*3/uL — ABNORMAL HIGH (ref 0.1–1.0)
Monocytes Relative: 9 %
Neutro Abs: 19.3 10*3/uL — ABNORMAL HIGH (ref 1.7–7.7)
Neutrophils Relative %: 86 %
Platelets: 269 10*3/uL (ref 150–400)
RBC: 4.95 MIL/uL (ref 4.22–5.81)
RDW: 13.2 % (ref 11.5–15.5)
WBC: 22.5 10*3/uL — ABNORMAL HIGH (ref 4.0–10.5)
nRBC: 0 % (ref 0.0–0.2)

## 2023-07-27 LAB — GLUCOSE, CAPILLARY
Glucose-Capillary: 200 mg/dL — ABNORMAL HIGH (ref 70–99)
Glucose-Capillary: 219 mg/dL — ABNORMAL HIGH (ref 70–99)
Glucose-Capillary: 228 mg/dL — ABNORMAL HIGH (ref 70–99)
Glucose-Capillary: 275 mg/dL — ABNORMAL HIGH (ref 70–99)

## 2023-07-27 LAB — MAGNESIUM: Magnesium: 1.9 mg/dL (ref 1.7–2.4)

## 2023-07-27 LAB — HIV ANTIBODY (ROUTINE TESTING W REFLEX): HIV Screen 4th Generation wRfx: NONREACTIVE

## 2023-07-27 SURGERY — CHOLECYSTECTOMY, ROBOT-ASSISTED, LAPAROSCOPIC
Anesthesia: General | Site: Abdomen

## 2023-07-27 MED ORDER — LACTATED RINGERS IV SOLN: INTRAVENOUS | Status: DC

## 2023-07-27 MED ORDER — STERILE WATER FOR IRRIGATION IR SOLN
Status: DC | PRN
  Administered 2023-07-27: 500 mL

## 2023-07-27 MED ORDER — INDOCYANINE GREEN 25 MG IV SOLR
INTRAVENOUS | Status: AC
  Administered 2023-07-27: 2.5 mg via INTRAVENOUS
  Filled 2023-07-27: qty 10

## 2023-07-27 MED ORDER — SODIUM CHLORIDE 0.9 % IV SOLN
1.0000 g | Freq: Three times a day (TID) | INTRAVENOUS | Status: DC
  Administered 2023-07-27 – 2023-07-31 (×12): 1 g via INTRAVENOUS
  Filled 2023-07-27 (×12): qty 20

## 2023-07-27 MED ORDER — SUGAMMADEX SODIUM 200 MG/2ML IV SOLN
INTRAVENOUS | Status: DC | PRN
  Administered 2023-07-27: 200 mg via INTRAVENOUS

## 2023-07-27 MED ORDER — PHENYLEPHRINE HCL (PRESSORS) 10 MG/ML IV SOLN
INTRAVENOUS | Status: DC | PRN
Start: 2023-07-27 — End: 2023-07-27
  Administered 2023-07-27 (×7): 160 ug via INTRAVENOUS

## 2023-07-27 MED ORDER — PHENYLEPHRINE 80 MCG/ML (10ML) SYRINGE FOR IV PUSH (FOR BLOOD PRESSURE SUPPORT)
PREFILLED_SYRINGE | INTRAVENOUS | Status: AC
  Filled 2023-07-27: qty 10

## 2023-07-27 MED ORDER — TRAZODONE HCL 50 MG PO TABS
50.0000 mg | ORAL_TABLET | Freq: Every evening | ORAL | Status: DC | PRN
  Filled 2023-07-27 (×2): qty 1

## 2023-07-27 MED ORDER — PHENOL 1.4 % MT LIQD
1.0000 | OROMUCOSAL | Status: DC | PRN
  Administered 2023-07-27: 1 via OROMUCOSAL
  Filled 2023-07-27: qty 177

## 2023-07-27 MED ORDER — PROPOFOL 10 MG/ML IV BOLUS
INTRAVENOUS | Status: DC | PRN
  Administered 2023-07-27: 150 mg via INTRAVENOUS

## 2023-07-27 MED ORDER — PROPOFOL 10 MG/ML IV BOLUS
INTRAVENOUS | Status: AC
  Filled 2023-07-27: qty 20

## 2023-07-27 MED ORDER — PANTOPRAZOLE SODIUM 40 MG PO TBEC
40.0000 mg | DELAYED_RELEASE_TABLET | Freq: Every day | ORAL | Status: DC
  Administered 2023-07-28 – 2023-07-31 (×4): 40 mg via ORAL
  Filled 2023-07-27 (×5): qty 1

## 2023-07-27 MED ORDER — SENNOSIDES-DOCUSATE SODIUM 8.6-50 MG PO TABS: 1.0000 | ORAL_TABLET | Freq: Every evening | ORAL | Status: DC | PRN

## 2023-07-27 MED ORDER — HEMOSTATIC AGENTS (NO CHARGE) OPTIME
TOPICAL | Status: DC | PRN
Start: 2023-07-27 — End: 2023-07-27
  Administered 2023-07-27 (×2): 1

## 2023-07-27 MED ORDER — ONDANSETRON HCL 4 MG/2ML IJ SOLN
4.0000 mg | Freq: Four times a day (QID) | INTRAMUSCULAR | Status: DC | PRN
  Administered 2023-07-27: 4 mg via INTRAVENOUS
  Filled 2023-07-27: qty 2

## 2023-07-27 MED ORDER — INDOCYANINE GREEN 25 MG IV SOLR: 2.5000 mg | INTRAVENOUS | Status: AC

## 2023-07-27 MED ORDER — METOPROLOL TARTRATE 5 MG/5ML IV SOLN
2.5000 mg | Freq: Once | INTRAVENOUS | Status: AC
  Administered 2023-07-27: 2.5 mg via INTRAVENOUS

## 2023-07-27 MED ORDER — ROCURONIUM BROMIDE 10 MG/ML (PF) SYRINGE
PREFILLED_SYRINGE | INTRAVENOUS | Status: DC | PRN
  Administered 2023-07-27: 60 mg via INTRAVENOUS
  Administered 2023-07-27: 20 mg via INTRAVENOUS

## 2023-07-27 MED ORDER — ASPIRIN 81 MG PO CHEW
81.0000 mg | CHEWABLE_TABLET | Freq: Every day | ORAL | Status: DC
  Administered 2023-07-28 – 2023-07-30 (×3): 81 mg via ORAL
  Filled 2023-07-27 (×5): qty 1

## 2023-07-27 MED ORDER — ONDANSETRON HCL 4 MG PO TABS: 4.0000 mg | ORAL_TABLET | Freq: Four times a day (QID) | ORAL | Status: DC | PRN

## 2023-07-27 MED ORDER — INSULIN ASPART 100 UNIT/ML IJ SOLN
0.0000 [IU] | Freq: Three times a day (TID) | INTRAMUSCULAR | Status: DC
  Administered 2023-07-27: 5 [IU] via SUBCUTANEOUS
  Administered 2023-07-27: 3 [IU] via SUBCUTANEOUS
  Administered 2023-07-28: 8 [IU] via SUBCUTANEOUS
  Administered 2023-07-28: 5 [IU] via SUBCUTANEOUS
  Administered 2023-07-28: 8 [IU] via SUBCUTANEOUS
  Administered 2023-07-29: 2 [IU] via SUBCUTANEOUS
  Administered 2023-07-30: 3 [IU] via SUBCUTANEOUS

## 2023-07-27 MED ORDER — METOPROLOL TARTRATE 25 MG PO TABS
25.0000 mg | ORAL_TABLET | Freq: Two times a day (BID) | ORAL | Status: DC
  Administered 2023-07-27 – 2023-07-31 (×9): 25 mg via ORAL
  Filled 2023-07-27 (×9): qty 1

## 2023-07-27 MED ORDER — INSULIN DETEMIR 100 UNIT/ML ~~LOC~~ SOLN
20.0000 [IU] | Freq: Every day | SUBCUTANEOUS | Status: DC
  Administered 2023-07-27 (×2): 20 [IU] via SUBCUTANEOUS
  Filled 2023-07-27 (×2): qty 0.2
  Filled 2023-07-27: qty 1
  Filled 2023-07-27: qty 0.2

## 2023-07-27 MED ORDER — ACETAMINOPHEN 325 MG PO TABS
650.0000 mg | ORAL_TABLET | Freq: Four times a day (QID) | ORAL | Status: DC | PRN
  Administered 2023-07-30: 650 mg via ORAL
  Filled 2023-07-27: qty 2

## 2023-07-27 MED ORDER — CHLORHEXIDINE GLUCONATE 0.12 % MT SOLN
15.0000 mL | Freq: Once | OROMUCOSAL | Status: AC
  Administered 2023-07-27: 15 mL via OROMUCOSAL

## 2023-07-27 MED ORDER — ONDANSETRON HCL 4 MG/2ML IJ SOLN
INTRAMUSCULAR | Status: DC | PRN
  Administered 2023-07-27: 4 mg via INTRAVENOUS

## 2023-07-27 MED ORDER — MIDAZOLAM HCL 2 MG/2ML IJ SOLN
INTRAMUSCULAR | Status: AC
  Filled 2023-07-27: qty 2

## 2023-07-27 MED ORDER — SODIUM CHLORIDE 0.9 % IR SOLN
Status: DC | PRN
  Administered 2023-07-27: 3000 mL

## 2023-07-27 MED ORDER — FENTANYL CITRATE (PF) 250 MCG/5ML IJ SOLN
INTRAMUSCULAR | Status: AC
  Filled 2023-07-27: qty 5

## 2023-07-27 MED ORDER — DEXAMETHASONE SODIUM PHOSPHATE 10 MG/ML IJ SOLN
INTRAMUSCULAR | Status: AC
  Filled 2023-07-27: qty 1

## 2023-07-27 MED ORDER — ORAL CARE MOUTH RINSE: 15.0000 mL | Freq: Once | OROMUCOSAL | Status: AC

## 2023-07-27 MED ORDER — METOPROLOL TARTRATE 5 MG/5ML IV SOLN
5.0000 mg | INTRAVENOUS | Status: DC | PRN
  Filled 2023-07-27: qty 5

## 2023-07-27 MED ORDER — LORAZEPAM 2 MG/ML IJ SOLN
0.5000 mg | Freq: Once | INTRAMUSCULAR | Status: AC
  Administered 2023-07-27: 0.5 mg via INTRAVENOUS
  Filled 2023-07-27: qty 1

## 2023-07-27 MED ORDER — MIDAZOLAM HCL 5 MG/5ML IJ SOLN
INTRAMUSCULAR | Status: DC | PRN
  Administered 2023-07-27: 2 mg via INTRAVENOUS

## 2023-07-27 MED ORDER — DEXAMETHASONE SODIUM PHOSPHATE 10 MG/ML IJ SOLN
INTRAMUSCULAR | Status: DC | PRN
Start: 2023-07-27 — End: 2023-07-27
  Administered 2023-07-27: 4 mg via INTRAVENOUS

## 2023-07-27 MED ORDER — OXYCODONE HCL 5 MG PO TABS: 5.0000 mg | ORAL_TABLET | ORAL | Status: DC | PRN

## 2023-07-27 MED ORDER — FENTANYL CITRATE (PF) 100 MCG/2ML IJ SOLN
INTRAMUSCULAR | Status: DC | PRN
  Administered 2023-07-27: 100 ug via INTRAVENOUS
  Administered 2023-07-27: 50 ug via INTRAVENOUS
  Administered 2023-07-27: 100 ug via INTRAVENOUS

## 2023-07-27 MED ORDER — SUCCINYLCHOLINE CHLORIDE 200 MG/10ML IV SOSY
PREFILLED_SYRINGE | INTRAVENOUS | Status: DC | PRN
  Administered 2023-07-27: 100 mg via INTRAVENOUS

## 2023-07-27 MED ORDER — MORPHINE SULFATE (PF) 2 MG/ML IV SOLN
2.0000 mg | INTRAVENOUS | Status: DC | PRN
  Administered 2023-07-28: 2 mg via INTRAVENOUS
  Filled 2023-07-27: qty 1

## 2023-07-27 MED ORDER — ROSUVASTATIN CALCIUM 20 MG PO TABS
40.0000 mg | ORAL_TABLET | Freq: Every day | ORAL | Status: DC
  Administered 2023-07-28 – 2023-07-31 (×4): 40 mg via ORAL
  Filled 2023-07-27 (×5): qty 2

## 2023-07-27 MED ORDER — ACETAMINOPHEN 650 MG RE SUPP: 650.0000 mg | Freq: Four times a day (QID) | RECTAL | Status: DC | PRN

## 2023-07-27 MED ORDER — HYDRALAZINE HCL 20 MG/ML IJ SOLN: 10.0000 mg | INTRAMUSCULAR | Status: DC | PRN

## 2023-07-27 MED ORDER — SUCCINYLCHOLINE CHLORIDE 200 MG/10ML IV SOSY
PREFILLED_SYRINGE | INTRAVENOUS | Status: AC
  Filled 2023-07-27: qty 10

## 2023-07-27 MED ORDER — DEXTROSE-SODIUM CHLORIDE 5-0.45 % IV SOLN: INTRAVENOUS | Status: DC

## 2023-07-27 MED ORDER — GUAIFENESIN 100 MG/5ML PO LIQD: 5.0000 mL | ORAL | Status: DC | PRN

## 2023-07-27 MED ORDER — BUPIVACAINE HCL (PF) 0.5 % IJ SOLN
INTRAMUSCULAR | Status: DC | PRN
  Administered 2023-07-27: 30 mL

## 2023-07-27 MED ORDER — ONDANSETRON HCL 4 MG/2ML IJ SOLN
INTRAMUSCULAR | Status: AC
  Filled 2023-07-27: qty 2

## 2023-07-27 MED ORDER — INSULIN ASPART 100 UNIT/ML IJ SOLN
0.0000 [IU] | Freq: Every day | INTRAMUSCULAR | Status: DC
  Administered 2023-07-27: 3 [IU] via SUBCUTANEOUS
  Administered 2023-07-27 – 2023-07-30 (×2): 2 [IU] via SUBCUTANEOUS

## 2023-07-27 MED ORDER — CHLORHEXIDINE GLUCONATE CLOTH 2 % EX PADS
6.0000 | MEDICATED_PAD | Freq: Once | CUTANEOUS | Status: AC
  Administered 2023-07-27: 6 via TOPICAL

## 2023-07-27 MED ORDER — ROCURONIUM BROMIDE 10 MG/ML (PF) SYRINGE
PREFILLED_SYRINGE | INTRAVENOUS | Status: AC
  Filled 2023-07-27: qty 10

## 2023-07-27 SURGICAL SUPPLY — 63 items
APL ESCP 73.6OZ SRGCL (TIP) ×1
APL PRP STRL LF DISP 70% ISPRP (MISCELLANEOUS) ×1
BLADE SURG 15 STRL LF DISP TIS (BLADE) ×1 IMPLANT
BLADE SURG 15 STRL SS (BLADE) ×1
CAUTERY HOOK MNPLR 1.6 DVNC XI (INSTRUMENTS) ×1 IMPLANT
CHLORAPREP W/TINT 26 (MISCELLANEOUS) ×1 IMPLANT
CLIP LIGATING HEM O LOK PURPLE (MISCELLANEOUS) ×1 IMPLANT
COVER LIGHT HANDLE STERIS (MISCELLANEOUS) ×2 IMPLANT
COVER TIP SHEARS 8 DVNC (MISCELLANEOUS) IMPLANT
DRAPE ARM DVNC X/XI (DISPOSABLE) ×4 IMPLANT
DRAPE COLUMN DVNC XI (DISPOSABLE) ×1 IMPLANT
DRSG TEGADERM 2-3/8X2-3/4 SM (GAUZE/BANDAGES/DRESSINGS) IMPLANT
ELECT REM PT RETURN 9FT ADLT (ELECTROSURGICAL) ×1
ELECTRODE REM PT RTRN 9FT ADLT (ELECTROSURGICAL) ×1 IMPLANT
EVACUATOR DRAINAGE 10X20 100CC (DRAIN) IMPLANT
EVACUATOR SILICONE 100CC (DRAIN) ×1
FORCEPS BPLR R/ABLATION 8 DVNC (INSTRUMENTS) ×1 IMPLANT
FORCEPS PROGRASP DVNC XI (FORCEP) ×1 IMPLANT
GAUZE SPONGE 2X2 STRL 8-PLY (GAUZE/BANDAGES/DRESSINGS) IMPLANT
GLOVE BIO SURGEON STRL SZ 6.5 (GLOVE) ×2 IMPLANT
GLOVE BIOGEL PI IND STRL 6.5 (GLOVE) ×2 IMPLANT
GLOVE BIOGEL PI IND STRL 7.0 (GLOVE) ×2 IMPLANT
GLOVE BIOGEL PI IND STRL 7.5 (GLOVE) IMPLANT
GLOVE SURG SS PI 6.5 STRL IVOR (GLOVE) IMPLANT
GLOVE SURG SS PI 7.5 STRL IVOR (GLOVE) IMPLANT
GOWN STRL REUS W/TWL LRG LVL3 (GOWN DISPOSABLE) ×4 IMPLANT
GRASPER SUT TROCAR 14GX15 (MISCELLANEOUS) IMPLANT
HEMOSTAT SNOW SURGICEL 2X4 (HEMOSTASIS) IMPLANT
IRRIGATOR SUCT 8 DISP DVNC XI (IRRIGATION / IRRIGATOR) IMPLANT
IV NS IRRIG 3000ML ARTHROMATIC (IV SOLUTION) ×1 IMPLANT
KIT TURNOVER KIT A (KITS) ×1 IMPLANT
MANIFOLD NEPTUNE II (INSTRUMENTS) IMPLANT
NDL HYPO 21X1.5 SAFETY (NEEDLE) ×1 IMPLANT
NDL INSUFFLATION 14GA 120MM (NEEDLE) ×1 IMPLANT
NEEDLE HYPO 21X1.5 SAFETY (NEEDLE) ×1 IMPLANT
NEEDLE INSUFFLATION 14GA 120MM (NEEDLE) ×1 IMPLANT
OBTURATOR OPTICAL STND 8 DVNC (TROCAR) ×1
OBTURATOR OPTICALSTD 8 DVNC (TROCAR) ×1 IMPLANT
PACK LAP CHOLE LZT030E (CUSTOM PROCEDURE TRAY) ×1 IMPLANT
PAD ARMBOARD 7.5X6 YLW CONV (MISCELLANEOUS) ×1 IMPLANT
PENCIL HANDSWITCHING (ELECTRODE) ×1 IMPLANT
POSITIONER HEAD 8X9X4 ADT (SOFTGOODS) ×1 IMPLANT
POWDER SURGICEL 3.0 GRAM (HEMOSTASIS) IMPLANT
RELOAD STAPLE 45 2.5 WHT DVNC (STAPLE) IMPLANT
RELOAD STAPLE 45 3.5 BLU DVNC (STAPLE) IMPLANT
RELOAD STAPLER 2.5X45 WHT DVNC (STAPLE) ×2 IMPLANT
RELOAD STAPLER 3.5X45 BLU DVNC (STAPLE) ×1 IMPLANT
SCISSORS MNPLR CVD DVNC XI (INSTRUMENTS) IMPLANT
SEAL UNIV 5-12 XI (MISCELLANEOUS) ×4 IMPLANT
SET BASIN LINEN APH (SET/KITS/TRAYS/PACK) ×1 IMPLANT
SET TUBE SMOKE EVAC HIGH FLOW (TUBING) ×1 IMPLANT
SPONGE DRAIN TRACH 4X4 STRL 2S (GAUZE/BANDAGES/DRESSINGS) IMPLANT
STAPLER 45 SUREFORM DVNC (STAPLE) IMPLANT
STAPLER RELOAD 2.5X45 WHT DVNC (STAPLE) ×2
STAPLER RELOAD 3.5X45 BLU DVNC (STAPLE) ×1
SUT ETHILON 3 0 FSL (SUTURE) IMPLANT
SUT MNCRL AB 4-0 PS2 18 (SUTURE) ×2 IMPLANT
SUT VICRYL 0 AB UR-6 (SUTURE) IMPLANT
SYR 30ML LL (SYRINGE) ×1 IMPLANT
SYS RETRIEVAL 5MM INZII UNIV (BASKET) ×1
SYSTEM RETRIEVL 5MM INZII UNIV (BASKET) ×1 IMPLANT
TIP ENDOSCOPIC SURGICEL (TIP) IMPLANT
WATER STERILE IRR 500ML POUR (IV SOLUTION) ×1 IMPLANT

## 2023-07-27 NOTE — Progress Notes (Signed)
[facility]  Updated family and told them about acute gangrenous cholecystitis. JP drain and reasons for this. Plan for HIDA before removal due to concern could get leak if staple line breaks down. Discussed subtotal cholecystectomy. Discussed antibiotics. Discussed ileus and post operative course.  Npo NG CXR ordered to confirm NG Meropenem for 5 days post op (can switch to oral but will likely need PCN allergy oral challenge)  IVF Labs in AM  Updated team.   Algis Greenhouse, MD [facility] [address] [place], [place] [address] [phone] (office)

## 2023-07-27 NOTE — Progress Notes (Signed)
Report taken from [facility] in the PACU, pt will be returning to the unit.

## 2023-07-27 NOTE — Anesthesia Preprocedure Evaluation (Signed)
Anesthesia Evaluation  Patient identified by MRN, date of birth, ID band Patient awake    Reviewed: Allergy & Precautions, H&P , NPO status , Patient's Chart, lab work & pertinent test results, reviewed documented beta blocker date and time   Airway Mallampati: II  TM Distance: >3 FB Neck ROM: full    Dental no notable dental hx.    Pulmonary neg pulmonary ROS, former smoker   Pulmonary exam normal breath sounds clear to auscultation       Cardiovascular Exercise Tolerance: Good negative cardio ROS  Rhythm:regular Rate:Normal     Neuro/Psych negative neurological ROS  negative psych ROS   GI/Hepatic negative GI ROS, Neg liver ROS,,,  Endo/Other  negative endocrine ROSdiabetes    Renal/GU negative Renal ROS  negative genitourinary   Musculoskeletal   Abdominal   Peds  Hematology negative hematology ROS (+)   Anesthesia Other Findings   Reproductive/Obstetrics negative OB ROS                             Anesthesia Physical Anesthesia Plan  ASA: 2  Anesthesia Plan: General and General ETT   Post-op Pain Management:    Induction:   PONV Risk Score and Plan: Ondansetron  Airway Management Planned:   Additional Equipment:   Intra-op Plan:   Post-operative Plan:   Informed Consent: I have reviewed the patients History and Physical, chart, labs and discussed the procedure including the risks, benefits and alternatives for the proposed anesthesia with the patient or authorized representative who has indicated his/her understanding and acceptance.     Dental Advisory Given  Plan Discussed with: CRNA  Anesthesia Plan Comments:        Anesthesia Quick Evaluation

## 2023-07-27 NOTE — Assessment & Plan Note (Signed)
Continue Crestor 

## 2023-07-27 NOTE — Anesthesia Procedure Notes (Signed)
Procedure Name: Intubation Date/Time: 07/27/2023 3:28 PM  Performed by: Windell Norfolk, MDPre-anesthesia Checklist: Patient identified, Emergency Drugs available, Suction available, Patient being monitored and Timeout performed Patient Re-evaluated:Patient Re-evaluated prior to induction Oxygen Delivery Method: Circle system utilized Preoxygenation: Pre-oxygenation with 100% oxygen Induction Type: IV induction Laryngoscope Size: Glidescope and 4 Grade View: Grade I Tube type: Oral Tube size: 7.0 mm Number of attempts: 2 Airway Equipment and Method: Video-laryngoscopy and Stylet Placement Confirmation: ETT inserted through vocal cords under direct vision, positive ETCO2 and breath sounds checked- equal and bilateral Secured at: 22 cm Tube secured with: Tape Dental Injury: Teeth and Oropharynx as per pre-operative assessment  Future Recommendations: Recommend- induction with short-acting agent, and alternative techniques readily available

## 2023-07-27 NOTE — Progress Notes (Signed)
PROGRESS NOTE    Jacob Acosta  WUJ:[id number] DOB: 10-28-1961 DOA: 07/26/2023 PCP: Benita Stabile, MD   Brief Narrative:   62 year old with history of anxiety, DM 2, HLD, CAD status post CABG comes to ED with complaints of lower back pain and abdominal pain.  Workup including CT abdomen pelvis showed acute cholecystitis, general surgery was consulted.  Assessment & Plan:  Principal Problem:   Acute cholecystitis Active Problems:   Mixed hyperlipidemia   Coronary artery disease   Type II diabetes mellitus with complication (HCC)   Anxiety   GERD (gastroesophageal reflux disease)    Acute cholecystitis LFTs are overall normal.  Currently on meropenem.  NPO.  Plans for laparoscopic cholecystectomy today.  IV fluids   GERD (gastroesophageal reflux disease) PPI   Anxiety Manage as needed   Type II diabetes mellitus with complication (HCC) Currently n.p.o. therefore Levemir reduced to 20 units at bedtime, sliding scale.  Adjust as necessary r   Coronary artery disease - Continue beta-blocker, aspirin and statin.  IV as needed   Mixed hyperlipidemia - Continue Crestor     DVT prophylaxis: SCDs Start: 07/27/23 0022 Code Status: Full code Family Communication:  Family at bedside Status is: Inpatient Lap chole planned today    Subjective: Doing ok no new complaints.    Examination:  General exam: Appears calm and comfortable  Respiratory system: Clear to auscultation. Respiratory effort normal. Cardiovascular system: S1 & S2 heard, RRR. No JVD, murmurs, rubs, gallops or clicks. No pedal edema. Gastrointestinal system: Abdomen is nondistended, soft and nontender. No organomegaly or masses felt. Normal bowel sounds heard. Central nervous system: Alert and oriented. No focal neurological deficits. Extremities: Symmetric 5 x 5 power. Skin: No rashes, lesions or ulcers Psychiatry: Judgement and insight appear normal. Mood & affect appropriate.      Diet Orders (From  admission, onward)     Start     Ordered   07/27/23 0812  Diet NPO time specified  Diet effective now        07/27/23 0811            Objective: Vitals:   07/27/23 0010 07/27/23 0025 07/27/23 0413 07/27/23 0829  BP: 132/74 (!) 138/95 (!) 145/84 127/66  Pulse: (!) 106  (!) 106 100  Resp:  20 18 18   Temp: 98.5 F (36.9 C) 97.9 F (36.6 C) 98.3 F (36.8 C) 98.2 F (36.8 C)  TempSrc: Oral Oral  Oral  SpO2: 91% 95% 92% 91%  Weight:  106.4 kg    Height:        Intake/Output Summary (Last 24 hours) at 07/27/2023 0909 Last data filed at 07/27/2023 0500 Gross per 24 hour  Intake 1196.76 ml  Output --  Net 1196.76 ml   Filed Weights   07/26/23 1733 07/27/23 0025  Weight: 104 kg 106.4 kg    Scheduled Meds:  aspirin  81 mg Oral Daily   insulin aspart  0-15 Units Subcutaneous TID WC   insulin aspart  0-5 Units Subcutaneous QHS   insulin detemir  20 Units Subcutaneous QHS   metoprolol tartrate  25 mg Oral BID   pantoprazole  40 mg Oral Daily   rosuvastatin  40 mg Oral Daily   Continuous Infusions:  meropenem (MERREM) IV      Nutritional status     Body mass index is 36.74 kg/m.  Data Reviewed:   CBC: Recent Labs  Lab 07/26/23 0315 07/26/23 1802 07/27/23 0411  WBC 10.9* 18.2* 22.5*  NEUTROABS  --  15.9* 19.3*  HGB 13.5 14.5 14.3  HCT 39.9 42.8 42.9  MCV 86.6 85.9 86.7  PLT 228 265 269   Basic Metabolic Panel: Recent Labs  Lab 07/26/23 0315 07/26/23 1802 07/27/23 0411  NA 134* 133* 130*  K 3.9 3.7 3.7  CL 101 94* 95*  CO2 26 25 24   GLUCOSE 155* 221* 215*  BUN 12 15 16   CREATININE 0.70 0.71 0.63  CALCIUM 8.6* 9.0 8.6*  MG  --   --  1.9   GFR: Estimated Creatinine Clearance: 109.9 mL/min (by C-G formula based on SCr of 0.63 mg/dL). Liver Function Tests: Recent Labs  Lab 07/26/23 0315 07/26/23 1802 07/27/23 0411  AST 16 18 23   ALT 21 22 22   ALKPHOS 78 93 104  BILITOT 0.9 1.7* 1.2  PROT 6.5 7.3 6.9  ALBUMIN 3.3* 3.5 3.2*   Recent  Labs  Lab 07/26/23 0315 07/26/23 1802  LIPASE 17 17   No results for input(s): "AMMONIA" in the last 168 hours. Coagulation Profile: No results for input(s): "INR", "PROTIME" in the last 168 hours. Cardiac Enzymes: No results for input(s): "CKTOTAL", "CKMB", "CKMBINDEX", "TROPONINI" in the last 168 hours. BNP (last 3 results) No results for input(s): "PROBNP" in the last 8760 hours. HbA1C: No results for input(s): "HGBA1C" in the last 72 hours. CBG: Recent Labs  Lab 07/27/23 0024 07/27/23 0724  GLUCAP 228* 219*   Lipid Profile: No results for input(s): "CHOL", "HDL", "LDLCALC", "TRIG", "CHOLHDL", "LDLDIRECT" in the last 72 hours. Thyroid Function Tests: No results for input(s): "TSH", "T4TOTAL", "FREET4", "T3FREE", "THYROIDAB" in the last 72 hours. Anemia Panel: No results for input(s): "VITAMINB12", "FOLATE", "FERRITIN", "TIBC", "IRON", "RETICCTPCT" in the last 72 hours. Sepsis Labs: No results for input(s): "PROCALCITON", "LATICACIDVEN" in the last 168 hours.  No results found for this or any previous visit (from the past 240 hour(s)).       Radiology Studies: CT ABDOMEN PELVIS W CONTRAST  Result Date: 07/26/2023 CLINICAL DATA:  Epigastric pain. EXAM: CT ABDOMEN AND PELVIS WITH CONTRAST TECHNIQUE: Multidetector CT imaging of the abdomen and pelvis was performed using the standard protocol following bolus administration of intravenous contrast. RADIATION DOSE REDUCTION: This exam was performed according to the departmental dose-optimization program which includes automated exposure control, adjustment of the mA and/or kV according to patient size and/or use of iterative reconstruction technique. CONTRAST:  OMNIPAQUE IOHEXOL 300 MG/ML  SOLN COMPARISON:  None Available. FINDINGS: Lower chest: There are bibasilar linear atelectasis/scarring. The visualized lung bases are otherwise clear. There is coronary vascular calcification. No intra-abdominal free air or free fluid.  Hepatobiliary: Apparent mild fatty liver. No biliary dilatation. The gallbladder is distended. No calcified gallstone. There is however inflammatory changes of the gallbladder wall and stranding of the pericholecystic fat most consistent with acute cholecystitis. Further evaluation with ultrasound recommended. Pancreas: Moderate pancreatic atrophy. No active inflammatory changes. No dilatation of the main pancreatic duct. Spleen: Normal in size without focal abnormality. Adrenals/Urinary Tract: The adrenal glands unremarkable. There is no hydronephrosis on either side. There is symmetric enhancement and excretion of contrast by both kidneys. The visualized ureters and urinary bladder appear unremarkable. Stomach/Bowel: There is no bowel obstruction or active inflammation. There is mild distension of the stomach. The appendix is normal. Vascular/Lymphatic: The abdominal aorta and IVC are unremarkable. No portal venous gas. There is no adenopathy. Reproductive: The prostate and seminal vesicles are grossly unremarkable. No pelvic mass. Other: None Musculoskeletal: No acute or significant osseous findings.  IMPRESSION: 1. Findings most consistent with acute cholecystitis. Further evaluation with ultrasound recommended. 2. No bowel obstruction. Normal appendix. Electronically Signed   By: Elgie Collard M.D.   On: 07/26/2023 19:44   DG Chest Port 1 View  Result Date: 07/26/2023 CLINICAL DATA:  Chest pain EXAM: PORTABLE CHEST 1 VIEW COMPARISON:  Chest x-ray 03/01/2022 FINDINGS: Sternotomy wires are present. The heart size and mediastinal contours are within normal limits. Both lungs are clear. The visualized skeletal structures are unremarkable. IMPRESSION: No active disease. Electronically Signed   By: Darliss Cheney M.D.   On: 07/26/2023 03:58           LOS: 1 day   Time spent= 35 mins    Miguel Rota, MD [facility]  If 7PM-7AM, please contact night-coverage  07/27/2023, 9:09 AM

## 2023-07-27 NOTE — H&P (Signed)
History and Physical    Patient: Jacob Acosta [id number]:[id number] DOB: 01-08-1961 DOA: 07/26/2023 DOS: the patient was seen and examined on 07/27/2023 PCP: Benita Stabile, MD  Patient coming from: Home  Chief Complaint:  Chief Complaint  Patient presents with   Abdominal Pain   Back Pain   HPI: Jacob Acosta is a 62 y.o. male with medical history significant of anxiety, diabetes mellitus type 2, hyperlipidemia, CAD, presents the ED with a chief complaint of lower back pain and abdominal pain.  Patient reports that he had lower back pain, abdominal pain, and chest pain.  He reports that he had an NSTEMI last year that required CABG x 3.  This did not feel like the heart attack that he had last year.  He reports that it felt like a tightness but not a burning.  Wife is at bedside she reports that 3 weeks ago he had 1 episode of what they thought was a stomach bug.  He had abdominal pain and a lot of nausea.  There was no vomiting at that time.  The episode resolved and then his symptoms started coming back this past weekend.  The symptoms then got acutely worse on the day of presentation.  Patient reports only thing patient can eat was cream of wheat or oatmeal.  Any p.o. liquid intake even would cause him to have dry heaving.  He has not had a bowel movement since Sunday or Monday which is abnormal for him.  He does have flatulence.  He has had chills but no fever.  Patient denies any hematemesis, hematochezia, melena.  He does report he has had some dysuria that is been going on about a week.  Patient has no other complaints at this time.  Patient does not smoke and does not drink.  Patient is full code. Review of Systems: As mentioned in the history of present illness. All other systems reviewed and are negative. Past Medical History:  Diagnosis Date   Anxiety    Diabetes mellitus without complication (HCC)    High cholesterol    Past Surgical History:  Procedure Laterality Date   CORONARY  ARTERY BYPASS GRAFT N/A 01/19/2022   Procedure: CORONARY ARTERY BYPASS GRAFTING (CABG), ON PUMP, TIMES THREE USING LEFT INTERNAL MAMMARY ARTERY AND RIGHT ENDOSCOPICALLY HARVESTED GREATER SAPHENOUS VEIN;  Surgeon: Corliss Skains, MD;  Location: [facility] OR;  Service: Open Heart Surgery;  Laterality: N/A;  left radial artery harvest   dental sugery     at age 58   LEFT HEART CATH AND CORONARY ANGIOGRAPHY N/A 01/17/2022   Procedure: LEFT HEART CATH AND CORONARY ANGIOGRAPHY;  Surgeon: Yvonne Kendall, MD;  Location: [facility] INVASIVE CV LAB;  Service: Cardiovascular;  Laterality: N/A;   RADIAL ARTERY HARVEST Left 01/19/2022   Procedure: RADIAL ARTERY HARVEST;  Surgeon: Corliss Skains, MD;  Location: [facility] OR;  Service: Open Heart Surgery;  Laterality: Left;   TEE WITHOUT CARDIOVERSION N/A 01/19/2022   Procedure: TRANSESOPHAGEAL ECHOCARDIOGRAM (TEE);  Surgeon: Corliss Skains, MD;  Location: [facility] OR;  Service: Open Heart Surgery;  Laterality: N/A;   TONSILLECTOMY  1980   Social History:  reports that he has quit smoking. His smoking use included cigarettes. He has never used smokeless tobacco. He reports that he does not drink alcohol and does not use drugs.  Allergies  Allergen Reactions   Ciprofloxacin Hcl Other (See Comments)    Hallucinations    Codeine Anxiety and Other (See Comments)    "Causes  him to feel very anxious"   Metformin And Related Nausea Only and Palpitations   Penicillins Rash   Toujeo Max Solostar [Insulin Glargine] Itching, Nausea Only, Palpitations and Other (See Comments)    Itching throat    Family History  Problem Relation Age of Onset   Anxiety disorder Mother    Depression Father    Alcohol abuse Father     Prior to Admission medications   Medication Sig Start Date End Date Taking? Authorizing Provider  acetaminophen (TYLENOL) 325 MG tablet Take 650 mg by mouth every 6 (six) hours as needed for mild pain.   Yes [provider]  aspirin 81 MG tablet Take 1  tablet (81 mg total) by mouth daily. 01/23/22  Yes Barrett, Erin R, PA-C  ezetimibe (ZETIA) 10 MG tablet TAKE 1 TABLET BY MOUTH EVERY DAY 07/11/23  Yes Croitoru, Mihai, MD  ibuprofen (ADVIL) 200 MG tablet Take 200 mg by mouth every 6 (six) hours as needed for mild pain.   Yes [provider]  Insulin Glargine (BASAGLAR KWIKPEN) 100 UNIT/ML Inject 30-34 Units into the skin in the morning and at bedtime.   Yes [provider]  metoprolol tartrate (LOPRESSOR) 25 MG tablet TAKE 1 TABLET BY MOUTH TWICE A DAY 07/11/23  Yes Croitoru, Mihai, MD  Multiple Vitamins-Minerals (CENTRUM SILVER 50+MEN PO) Take 1 tablet by mouth daily.   Yes [provider]  ondansetron (ZOFRAN) 4 MG tablet Take 1 tablet (4 mg total) by mouth every 8 (eight) hours as needed for nausea or vomiting. 12/03/22  Yes Avegno, Zachery Dakins, FNP  pantoprazole (PROTONIX) 40 MG tablet Take 1 tablet (40 mg total) by mouth daily. 07/25/23 08/24/23 Yes Leath-Warren, Sadie Haber, NP  rosuvastatin (CRESTOR) 40 MG tablet TAKE 1 TABLET BY MOUTH EVERY DAY 05/15/23  Yes Croitoru, Mihai, MD  sucralfate (CARAFATE) 1 g tablet Take 1 tablet (1 g total) by mouth 4 (four) times daily as needed. 07/26/23  Yes Sabas Sous, MD  vitamin C (ASCORBIC ACID) 500 MG tablet Take 1,000 mg by mouth daily.   Yes [provider]    Physical Exam: Vitals:   07/27/23 0000 07/27/23 0010 07/27/23 0025 07/27/23 0413  BP:  132/74 (!) 138/95 (!) 145/84  Pulse:  (!) 106  (!) 106  Resp:   20 18  Temp:  98.5 F (36.9 C) 97.9 F (36.6 C) 98.3 F (36.8 C)  TempSrc:  Oral Oral   SpO2: 92% 91% 95% 92%  Weight:   106.4 kg   Height:       1.  General: Patient lying supine in bed,  no acute distress   2. Psychiatric: Alert and oriented x 3, mood and behavior normal for situation, pleasant and cooperative with exam   3. Neurologic: Speech and language are normal, face is symmetric, moves all 4 extremities voluntarily, at baseline without  acute deficits on limited exam   4. HEENMT:  Head is atraumatic, normocephalic, pupils reactive to light, neck is supple, trachea is midline, mucous membranes are moist   5. Respiratory : Lungs are clear to auscultation bilaterally without wheezing, rhonchi, rales, no cyanosis, no increase in work of breathing or accessory muscle use   6. Cardiovascular : Heart rate tachycardic, rhythm is regular, no murmurs, rubs or gallops, no peripheral edema, peripheral pulses palpated   7. Gastrointestinal:  Abdomen is soft, nondistended, nontender to palpation bowel sounds active, no masses or organomegaly palpated   8. Skin:  Skin is warm, dry  and intact without rashes, acute lesions, or ulcers on limited exam   9.Musculoskeletal:  No acute deformities or trauma, no asymmetry in tone, no peripheral edema, peripheral pulses palpated, no tenderness to palpation in the extremities  Data Reviewed: In the ED Temp 98.1, heart rate 84-120, respiratory rate 16-21, blood pressure 136/79-151/95, satting 93-100% Leukocytosis related to acute cholecystitis to 18.2 Hyperglycemia related to diabetes mellitus type 2 at 221 UA is not indicative of UTI CT abdomen pelvis shows findings most consistent with acute cholecystitis and no bowel obstruction Chest x-ray shows no active disease EKG shows a heart rate of 86, QTc 4 and 17, sinus rhythm Patient was started on cefepime, Flagyl, morphine, Zofran, 1 L normal saline General Surgery was consulted and plans to see patient in the a.m. Admission requested due to acute cholecystitis  Assessment and Plan: * Acute cholecystitis - Patient was given cefepime and Flagyl in the ED - Continue Rocephin - General Surgery to see patient this a.m. - N.p.o. - Continue to monitor  GERD (gastroesophageal reflux disease) - Continue PPI  Anxiety - One-time dose of Ativan for visible anxiety during exam  Type II diabetes mellitus with complication (HCC) - 30 units of  basal insulin at baseline - Continue reduced dose of basal insulin here 20 units - Continue sliding scale coverage - Hemoglobin A1c in the a.m. - Currently n.p.o. - Continue to monitor  Coronary artery disease - Continue beta-blocker, aspirin and statin  Mixed hyperlipidemia - Continue Crestor      Advance Care Planning:   Code Status: Full Code  Consults: General Surgery  Family Communication: Wife at bedside  Severity of Illness: The appropriate patient status for this patient is INPATIENT. Inpatient status is judged to be reasonable and necessary in order to provide the required intensity of service to ensure the patient's safety. The patient's presenting symptoms, physical exam findings, and initial radiographic and laboratory data in the context of their chronic comorbidities is felt to place them at high risk for further clinical deterioration. Furthermore, it is not anticipated that the patient will be medically stable for discharge from the hospital within 2 midnights of admission.   * I certify that at the point of admission it is my clinical judgment that the patient will require inpatient hospital care spanning beyond 2 midnights from the point of admission due to high intensity of service, high risk for further deterioration and high frequency of surveillance required.*  Author: Lilyan Gilford, DO 07/27/2023 5:07 AM  For on call review [email or web address].

## 2023-07-27 NOTE — Assessment & Plan Note (Addendum)
-   Continue beta-blocker, aspirin and statin

## 2023-07-27 NOTE — Assessment & Plan Note (Signed)
-   One-time dose of Ativan for visible anxiety during exam

## 2023-07-27 NOTE — Consult Note (Addendum)
I was present with the medical student for this service. I personally verified the history of present illness, performed the physical exam, and made the plan for this encounter. I have verified the medical student's documentation and made modifications where appropriately. I have personally documented in my own words a brief history, physical, and plan below.     RUQ pain worsening, nausea/vomiting. CT with concern for acute cholecystitis. 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  Personally reviewed CT and showed patient and family. Gallbladder with stranding and distention.   PLAN: I counseled the patient about the indication, risks and benefits of robotic assisted laparoscopic cholecystectomy.  He understands there is a very small chance for bleeding, infection, injury to normal structures (including common bile duct), conversion to open surgery, persistent symptoms, evolution of postcholecystectomy diarrhea, need for secondary interventions, anesthesia reaction, cardiopulmonary issues and other risks not specifically detailed here. I described the expected recovery, the plan for follow-up and the restrictions during the recovery phase.  All questions were answered.  Algis Greenhouse, MD [facility] [address] [place], [place] [address] [phone] (office)   Reason for Consult:Cholecystitis Referring Physician: Stephania Fragmin, MD  Jacob Acosta is an 62 y.o. male.  HPI: Patient has had episodic RUQ pain for a few weeks that has gotten worse recently. RUQ pain radiates to back. He is unable to define what causes his pain, but has not been able to eat much recently due to nausea. Endorses nausea and vomiting and some chills. Denies chest pain, SOB, or weight loss.  Past Medical History:  Diagnosis Date   Anxiety    Diabetes mellitus without complication (HCC)    High cholesterol     Past Surgical History:  Procedure Laterality Date   CORONARY ARTERY  BYPASS GRAFT N/A 01/19/2022   Procedure: CORONARY ARTERY BYPASS GRAFTING (CABG), ON PUMP, TIMES THREE USING LEFT INTERNAL MAMMARY ARTERY AND RIGHT ENDOSCOPICALLY HARVESTED GREATER SAPHENOUS VEIN;  Surgeon: Corliss Skains, MD;  Location: [facility] OR;  Service: Open Heart Surgery;  Laterality: N/A;  left radial artery harvest   dental sugery     at age 75   LEFT HEART CATH AND CORONARY ANGIOGRAPHY N/A 01/17/2022   Procedure: LEFT HEART CATH AND CORONARY ANGIOGRAPHY;  Surgeon: Yvonne Kendall, MD;  Location: [facility] INVASIVE CV LAB;  Service: Cardiovascular;  Laterality: N/A;   RADIAL ARTERY HARVEST Left 01/19/2022   Procedure: RADIAL ARTERY HARVEST;  Surgeon: Corliss Skains, MD;  Location: [facility] OR;  Service: Open Heart Surgery;  Laterality: Left;   TEE WITHOUT CARDIOVERSION N/A 01/19/2022   Procedure: TRANSESOPHAGEAL ECHOCARDIOGRAM (TEE);  Surgeon: Corliss Skains, MD;  Location[facility];  Service: Open Heart Surgery;  Laterality: N/A;   TONSILLECTOMY  1980    Family History  Problem Relation Age of Onset   Anxiety disorder Mother    Depression Father    Alcohol abuse Father     Social History:  reports that he has quit smoking. His smoking use included cigarettes. He has never used smokeless tobacco. He reports that he does not drink alcohol and does not use drugs.  Allergies:  Allergies  Allergen Reactions   Ciprofloxacin Hcl Other (See Comments)    Hallucinations    Codeine Anxiety and Other (See Comments)    "Causes him to feel very anxious"   Metformin And Related Nausea Only and Palpitations   Penicillins Rash   Toujeo Max Solostar [Insulin Glargine] Itching, Nausea Only, Palpitations and Other (  See Comments)    Itching throat    Medications: Prior to Admission:  Medications Prior to Admission  Medication Sig Dispense Refill Last Dose   acetaminophen (TYLENOL) 325 MG tablet Take 650 mg by mouth every 6 (six) hours as needed for mild pain.   07/26/2023   aspirin 81 MG tablet Take  1 tablet (81 mg total) by mouth daily. 30 tablet  07/25/2023 at 0600   ezetimibe (ZETIA) 10 MG tablet TAKE 1 TABLET BY MOUTH EVERY DAY 30 tablet 2 07/26/2023   ibuprofen (ADVIL) 200 MG tablet Take 200 mg by mouth every 6 (six) hours as needed for mild pain.   07/26/2023   Insulin Glargine (BASAGLAR KWIKPEN) 100 UNIT/ML Inject 30-34 Units into the skin in the morning and at bedtime.   07/26/2023   metoprolol tartrate (LOPRESSOR) 25 MG tablet TAKE 1 TABLET BY MOUTH TWICE A DAY 60 tablet 2 07/26/2023   Multiple Vitamins-Minerals (CENTRUM SILVER 50+MEN PO) Take 1 tablet by mouth daily.   07/26/2023   ondansetron (ZOFRAN) 4 MG tablet Take 1 tablet (4 mg total) by mouth every 8 (eight) hours as needed for nausea or vomiting. 20 tablet 0 unknown   pantoprazole (PROTONIX) 40 MG tablet Take 1 tablet (40 mg total) by mouth daily. 30 tablet 0 07/26/2023   rosuvastatin (CRESTOR) 40 MG tablet TAKE 1 TABLET BY MOUTH EVERY DAY 30 tablet 11 07/26/2023   sucralfate (CARAFATE) 1 g tablet Take 1 tablet (1 g total) by mouth 4 (four) times daily as needed. 30 tablet 0 07/26/2023   vitamin C (ASCORBIC ACID) 500 MG tablet Take 1,000 mg by mouth daily.   07/24/2023    Results for orders placed or performed during the hospital encounter of 07/26/23 (from the past 48 hour(s))  Urinalysis, Routine w reflex microscopic -Urine, Clean Catch     Status: Abnormal   Collection Time: 07/26/23  5:36 PM  Result Value Ref Range   Color, Urine YELLOW YELLOW   APPearance CLEAR CLEAR   Specific Gravity, Urine 1.039 (H) 1.005 - 1.030   pH 5.0 5.0 - 8.0   Glucose, UA >=500 (A) NEGATIVE mg/dL   Hgb urine dipstick NEGATIVE NEGATIVE   Bilirubin Urine NEGATIVE NEGATIVE   Ketones, ur 80 (A) NEGATIVE mg/dL   Protein, ur 161 (A) NEGATIVE mg/dL   Nitrite NEGATIVE NEGATIVE   Leukocytes,Ua NEGATIVE NEGATIVE   RBC / HPF 0-5 0 - 5 RBC/hpf   WBC, UA 0-5 0 - 5 WBC/hpf   Bacteria, UA RARE (A) NONE SEEN   Squamous Epithelial / HPF 0-5 0 - 5 /HPF    Mucus PRESENT     Comment: Performed at [facility], [address], [place], [place] [address]  Lipase, blood     Status: None   Collection Time: 07/26/23  6:02 PM  Result Value Ref Range   Lipase 17 11 - 51 U/L    Comment: Performed at [facility], [address], [place], [place] [address]  Comprehensive metabolic panel     Status: Abnormal   Collection Time: 07/26/23  6:02 PM  Result Value Ref Range   Sodium 133 (L) 135 - 145 mmol/L   Potassium 3.7 3.5 - 5.1 mmol/L   Chloride 94 (L) 98 - 111 mmol/L   CO2 25 22 - 32 mmol/L   Glucose, Bld 221 (H) 70 - 99 mg/dL    Comment: Glucose reference range applies only to samples taken after fasting for at least 8 hours.   BUN  15 8 - 23 mg/dL   Creatinine, Ser 2.20 0.61 - 1.24 mg/dL   Calcium 9.0 8.9 - 25.4 mg/dL   Total Protein 7.3 6.5 - 8.1 g/dL   Albumin 3.5 3.5 - 5.0 g/dL   AST 18 15 - 41 U/L   ALT 22 0 - 44 U/L   Alkaline Phosphatase 93 38 - 126 U/L   Total Bilirubin 1.7 (H) 0.3 - 1.2 mg/dL   GFR, Estimated >27 >06 mL/min    Comment: (NOTE) Calculated using the CKD-EPI Creatinine Equation (2021)    Anion gap 14 5 - 15    Comment: Performed at [facility], [address] [place], [place] [address]  CBC with Differential     Status: Abnormal   Collection Time: 07/26/23  6:02 PM  Result Value Ref Range   WBC 18.2 (H) 4.0 - 10.5 K/uL   RBC 4.98 4.22 - 5.81 MIL/uL   Hemoglobin 14.5 13.0 - 17.0 g/dL   HCT 83.1 51.7 - 61.6 %   MCV 85.9 80.0 - 100.0 fL   MCH 29.1 26.0 - 34.0 pg   MCHC 33.9 30.0 - 36.0 g/dL   RDW 07.3 71.0 - 62.6 %   Platelets 265 150 - 400 K/uL   nRBC 0.0 0.0 - 0.2 %   Neutrophils Relative % 86 %   Neutro Abs 15.9 (H) 1.7 - 7.7 K/uL   Lymphocytes Relative 4 %   Lymphs Abs 0.6 (L) 0.7 - 4.0 K/uL   Monocytes Relative 9 %   Monocytes Absolute 1.6 (H) 0.1 - 1.0 K/uL   Eosinophils Relative 0 %   Eosinophils Absolute 0.0 0.0 - 0.5 K/uL   Basophils Relative 0 %   Basophils Absolute 0.0 0.0 - 0.1 K/uL   Immature  Granulocytes 1 %   Abs Immature Granulocytes 0.12 (H) 0.00 - 0.07 K/uL    Comment: Performed at [facility], [address], [place], [place] [address]  Troponin I (High Sensitivity)     Status: None   Collection Time: 07/26/23  6:02 PM  Result Value Ref Range   Troponin I (High Sensitivity) 9 <18 ng/L    Comment: (NOTE) Elevated high sensitivity troponin I (hsTnI) values and significant  changes across serial measurements may suggest ACS but many other  chronic and acute conditions are known to elevate hsTnI results.  Refer to the "Links" section for chest pain algorithms and additional  guidance. Performed at [facility], [address], [place], [place] [address]   Glucose, capillary     Status: Abnormal   Collection Time: 07/27/23 12:24 AM  Result Value Ref Range   Glucose-Capillary 228 (H) 70 - 99 mg/dL    Comment: Glucose reference range applies only to samples taken after fasting for at least 8 hours.  HIV Antibody (routine testing w rflx)     Status: None   Collection Time: 07/27/23  4:11 AM  Result Value Ref Range   HIV Screen 4th Generation wRfx Non Reactive Non Reactive    Comment: Performed at [facility], [address] [place], [place] [address]  Comprehensive metabolic panel     Status: Abnormal   Collection Time: 07/27/23  4:11 AM  Result Value Ref Range   Sodium 130 (L) 135 - 145 mmol/L   Potassium 3.7 3.5 - 5.1 mmol/L   Chloride 95 (L) 98 - 111 mmol/L   CO2 24 22 - 32 mmol/L   Glucose, Bld 215 (H) 70 - 99 mg/dL  Comment: Glucose reference range applies only to samples taken after fasting for at least 8 hours.   BUN 16 8 - 23 mg/dL   Creatinine, Ser 1.61 0.61 - 1.24 mg/dL   Calcium 8.6 (L) 8.9 - 10.3 mg/dL   Total Protein 6.9 6.5 - 8.1 g/dL   Albumin 3.2 (L) 3.5 - 5.0 g/dL   AST 23 15 - 41 U/L   ALT 22 0 - 44 U/L   Alkaline Phosphatase 104 38 - 126 U/L   Total Bilirubin 1.2 0.3 - 1.2 mg/dL   GFR, Estimated >09 >60 mL/min    Comment:  (NOTE) Calculated using the CKD-EPI Creatinine Equation (2021)    Anion gap 11 5 - 15    Comment: Performed at [facility], [address], [place], [place] [address]  Magnesium     Status: None   Collection Time: 07/27/23  4:11 AM  Result Value Ref Range   Magnesium 1.9 1.7 - 2.4 mg/dL    Comment: Performed at [facility], [address], [place], [place] [address]  CBC with Differential/Platelet     Status: Abnormal   Collection Time: 07/27/23  4:11 AM  Result Value Ref Range   WBC 22.5 (H) 4.0 - 10.5 K/uL   RBC 4.95 4.22 - 5.81 MIL/uL   Hemoglobin 14.3 13.0 - 17.0 g/dL   HCT 47.8 29.5 - 62.1 %   MCV 86.7 80.0 - 100.0 fL   MCH 28.9 26.0 - 34.0 pg   MCHC 33.3 30.0 - 36.0 g/dL   RDW 30.8 65.7 - 84.6 %   Platelets 269 150 - 400 K/uL   nRBC 0.0 0.0 - 0.2 %   Neutrophils Relative % 86 %   Neutro Abs 19.3 (H) 1.7 - 7.7 K/uL   Lymphocytes Relative 3 %   Lymphs Abs 0.8 0.7 - 4.0 K/uL   Monocytes Relative 9 %   Monocytes Absolute 2.0 (H) 0.1 - 1.0 K/uL   Eosinophils Relative 0 %   Eosinophils Absolute 0.0 0.0 - 0.5 K/uL   Basophils Relative 0 %   Basophils Absolute 0.0 0.0 - 0.1 K/uL   Immature Granulocytes 2 %   Abs Immature Granulocytes 0.34 (H) 0.00 - 0.07 K/uL    Comment: Performed at [facility], [address] [place], [place] [address]  Glucose, capillary     Status: Abnormal   Collection Time: 07/27/23  7:24 AM  Result Value Ref Range   Glucose-Capillary 219 (H) 70 - 99 mg/dL    Comment: Glucose reference range applies only to samples taken after fasting for at least 8 hours.    CT ABDOMEN PELVIS W CONTRAST  Result Date: 07/26/2023 CLINICAL DATA:  Epigastric pain. EXAM: CT ABDOMEN AND PELVIS WITH CONTRAST TECHNIQUE: Multidetector CT imaging of the abdomen and pelvis was performed using the standard protocol following bolus administration of intravenous contrast. RADIATION DOSE REDUCTION: This exam was performed according to the departmental dose-optimization program  which includes automated exposure control, adjustment of the mA and/or kV according to patient size and/or use of iterative reconstruction technique. CONTRAST:  OMNIPAQUE IOHEXOL 300 MG/ML  SOLN COMPARISON:  None Available. FINDINGS: Lower chest: There are bibasilar linear atelectasis/scarring. The visualized lung bases are otherwise clear. There is coronary vascular calcification. No intra-abdominal free air or free fluid. Hepatobiliary: Apparent mild fatty liver. No biliary dilatation. The gallbladder is distended. No calcified gallstone. There is however inflammatory changes of the gallbladder wall and stranding of the pericholecystic fat most consistent with acute  cholecystitis. Further evaluation with ultrasound recommended. Pancreas: Moderate pancreatic atrophy. No active inflammatory changes. No dilatation of the main pancreatic duct. Spleen: Normal in size without focal abnormality. Adrenals/Urinary Tract: The adrenal glands unremarkable. There is no hydronephrosis on either side. There is symmetric enhancement and excretion of contrast by both kidneys. The visualized ureters and urinary bladder appear unremarkable. Stomach/Bowel: There is no bowel obstruction or active inflammation. There is mild distension of the stomach. The appendix is normal. Vascular/Lymphatic: The abdominal aorta and IVC are unremarkable. No portal venous gas. There is no adenopathy. Reproductive: The prostate and seminal vesicles are grossly unremarkable. No pelvic mass. Other: None Musculoskeletal: No acute or significant osseous findings. IMPRESSION: 1. Findings most consistent with acute cholecystitis. Further evaluation with ultrasound recommended. 2. No bowel obstruction. Normal appendix. Electronically Signed   By: Elgie Collard M.D.   On: 07/26/2023 19:44   DG Chest Port 1 View  Result Date: 07/26/2023 CLINICAL DATA:  Chest pain EXAM: PORTABLE CHEST 1 VIEW COMPARISON:  Chest x-ray 03/01/2022 FINDINGS: Sternotomy  wires are present. The heart size and mediastinal contours are within normal limits. Both lungs are clear. The visualized skeletal structures are unremarkable. IMPRESSION: No active disease. Electronically Signed   By: Darliss Cheney M.D.   On: 07/26/2023 03:58    ROS:  Pertinent items are noted in HPI.  Blood pressure 127/66, pulse 100, temperature 98.2 F (36.8 C), temperature source Oral, resp. rate 18, height 5\' 7"  (1.702 m), weight 106.4 kg, SpO2 91%. Physical Exam:  Gen: in no acute distress CV: RRR, no MRG Pulm: normal WOB, lungs clear to auscultation Ab: tender to palpation bilaterally, normal bowel sounds  Assessment/Plan: Abdominal Pain Patient with history and imaging findings consistent with acute cholecystitis. Recommend cholecystectomy, planning for this afternoon. Patient is NPO, continue IVF, abx. He reports that his pain is currently well controlled.  Anselm Pancoast Dabbs 07/27/2023, 10:40 AM

## 2023-07-27 NOTE — Assessment & Plan Note (Signed)
Continue PPI ?

## 2023-07-27 NOTE — Transfer of Care (Signed)
Immediate Anesthesia Transfer of Care Note  Patient: DANYAEL YI  Procedure(s) Performed: XI ROBOTIC ASSISTED LAPAROSCOPIC SUBTOTAL CHOLECYSTECTOMY (Abdomen)  Patient Location: PACU  Anesthesia Type:General  Level of Consciousness: drowsy  Airway & Oxygen Therapy: Patient Spontanous Breathing and Patient connected to nasal cannula oxygen  Post-op Assessment: Report given to RN and Post -op Vital signs reviewed and stable  Post vital signs: Reviewed and stable  Last Vitals:  Vitals Value Taken Time  BP 128/80 07/27/23 1800  Temp 37   Pulse 104 07/27/23 1809  Resp 20 07/27/23 1809  SpO2 95 % 07/27/23 1809  Vitals shown include unfiled device data.  Last Pain:  Vitals:   07/27/23 1430  TempSrc: Oral  PainSc: 0-No pain         Complications: No notable events documented.

## 2023-07-27 NOTE — Progress Notes (Addendum)
Pharmacy Antibiotic Note  Jacob Acosta is a 62 y.o. male admitted on 07/26/2023 with  gangrenous cholecystitis . Patient has allergies to penicillins and cipro. Pharmacy has been consulted for meropenem dosing.  Plan: Meropenem 1000 mg IV every 8 hours. Monitor labs, c/s, and patient improvement. Consider penicillin oral challenge in upcoming days.  Height: 5\' 7"  (170.2 cm) Weight: 106.4 kg (234 lb 9.1 oz) IBW/kg (Calculated) : 66.1  Temp (24hrs), Avg:98.3 F (36.8 C), Min:97.9 F (36.6 C), Max:98.8 F (37.1 C)  Recent Labs  Lab 07/26/23 0315 07/26/23 1802 07/27/23 0411  WBC 10.9* 18.2* 22.5*  CREATININE 0.70 0.71 0.63    Estimated Creatinine Clearance: 109.9 mL/min (by C-G formula based on SCr of 0.63 mg/dL).    Allergies  Allergen Reactions   Amoxicillin     Per pt. "Felt like TV was closing in"   Ciprofloxacin Hcl Other (See Comments)    Hallucinations    Codeine Anxiety and Other (See Comments)    "Causes him to feel very anxious"   Metformin And Related Nausea Only and Palpitations   Penicillins Rash   Toujeo Max Solostar [Insulin Glargine] Itching, Nausea Only, Palpitations and Other (See Comments)    Itching throat    Antimicrobials this admission: Merrem 9/12 >> Cefepime/flagyl 9/12 CTX 9/11  Microbiology results: None pending  Thank you for allowing pharmacy to be a part of this patient's care.  Judeth Cornfield, PharmD Clinical Pharmacist 07/27/2023 8:21 PM

## 2023-07-27 NOTE — Assessment & Plan Note (Signed)
-   30 units of basal insulin at baseline - Continue reduced dose of basal insulin here 20 units - Continue sliding scale coverage - Hemoglobin A1c in the a.m. - Currently n.p.o. - Continue to monitor

## 2023-07-27 NOTE — Progress Notes (Signed)
[facility]  RN reporting patient very anxious/ received ativan 0.5 last night. Will reorder now once.   Algis Greenhouse, MD [facility] [address] [place], [place] [address] [phone] (office)

## 2023-07-27 NOTE — Progress Notes (Signed)
   07/27/23 0831  TOC Brief Assessment  Insurance and Status Reviewed  Patient has primary care physician Yes  Home environment has been reviewed from home  Prior level of function: independent  Prior/Current Home Services No current home services  Social Determinants of Health Reivew SDOH reviewed no interventions necessary  Readmission risk has been reviewed Yes  Transition of care needs no transition of care needs at this time    Transition of Care Department [facility]) has reviewed patient and no TOC needs have been identified at this time. We will continue to monitor patient advancement through interdisciplinary progression rounds. If new patient transition needs arise, please place a TOC consult.

## 2023-07-27 NOTE — Plan of Care (Signed)
  Problem: Education: Goal: Knowledge of General Education information will improve Description: Including pain rating scale, medication(s)/side effects and non-pharmacologic comfort measures Outcome: Progressing   Problem: Clinical Measurements: Goal: Ability to maintain clinical measurements within normal limits will improve Outcome: Progressing Goal: Will remain free from infection Outcome: Progressing Goal: Diagnostic test results will improve Outcome: Progressing Goal: Cardiovascular complication will be avoided Outcome: Progressing   Problem: Activity: Goal: Risk for activity intolerance will decrease Outcome: Progressing   Problem: Nutrition: Goal: Adequate nutrition will be maintained Outcome: Progressing   Problem: Elimination: Goal: Will not experience complications related to bowel motility Outcome: Progressing Goal: Will not experience complications related to urinary retention Outcome: Progressing   Problem: Pain Managment: Goal: General experience of comfort will improve Outcome: Progressing

## 2023-07-27 NOTE — Assessment & Plan Note (Signed)
-   Patient was given cefepime and Flagyl in the ED - Continue Rocephin - General Surgery to see patient this a.m. - N.p.o. - Continue to monitor

## 2023-07-27 NOTE — Op Note (Addendum)
[facility] Operative Note  07/27/23  Preoperative Diagnosis: Acute cholecystitis    Postoperative Diagnosis: Acute gangrenous cholecystitis, ileus    Procedure(s) Performed: Robotic Assisted Laparoscopic Subtotal Cholecystectomy (1cm of infundibulum remaining)    Surgeon: Leatrice Jewels. Henreitta Leber, MD   Assistants: No qualified resident was available    Anesthesia: General endotracheal   Anesthesiologist: Windell Norfolk, MD    Specimens: Gallbladder   Estimated Blood Loss: Minimal   Blood Replacement: None    Complications: None   Wound Class: Dirty infected    Operative Indications: The patient was found to have concerns for acute cholecystitis on CT on imaging and was symptomatic with worsening WBC.  We discussed the risk of the procedure including but not limited to bleeding, infection, injury to the common bile duct, bile leak, need for further procedures, chance of subtotal cholecystectomy.   Findings:  Gangrenous cholecystitis, posterior wall completely necrotic, extensive rind Doom down, took gallbladder on a pedicle with stapler Adequate hemostasis   Gangrenous changes on the anterior wall infundibulum, rind  Gangrenous posterior wall    Procedure: Firefly was given in the preoperative area. The patient was taken to the operating room and placed supine. General endotracheal anesthesia was induced. Intravenous antibiotics were administered per protocol.  An orogastric tube positioned to decompress the stomach. The abdomen was prepared and draped in the usual sterile fashion.   Veress needle was placed at the supraumbilical area and insufflation was started after confirming a positive saline drop test and no immediate increase in abdominal pressure.  After reaching 15 mm, the Veress needle was removed and a 8 mm port was placed via optiview technique supraumbilical, measuring 20 mm away from the suspected position of the gallbladder.  The abdomen was  inspected and no abnormalities or injuries were found.  Under direct vision, ports were placed in the following locations in a semi curvilinear position around the target of the gallbladder: Two 8 mm ports on the patient's right each having 8cm clearance to the adjacent ports and one 8 mm port placed on the patient's left 8 cm from the umbilical port. Once ports were placed, the table was placed in the reverse Trendelenburg position with the right side up. The Xi platform was brought into the operative field and docked to the ports successfully.  An endoscope was placed through the umbilical port, prograsper through the most lateral right port, forced bipolar to the port just right of the umbilicus, and then a hook cautery in the left port.  There were extensive omental adhesion that were taken down with cautery. The gallbladder appeared distended. It was decompressed and a suction irrigator was used. The dome of the gallbladder was grasped with prograsp and retracted over the dome of the liver. Adhesions between the gallbladder and omentum, duodenum and transverse colon were lysed via hook cautery. The infundibulum was grasped with the forced bipolar and retracted toward the right lower quadrant. There was extensive rind that kept tearing away from the gallbladder. There was an area on the anterior wall at the infundibulum with concern for necrosis. Given this and the rind. I opted to do a dome down approach. I used Firefly extensively to ensure I did not get near any bile ducts.   I brought the gallbladder up on a pedicle with the posterior wall being mostly necrotic. I felt like I could visualize the cystic duct and the gallbladder and cystic duct never lit up on Firefly. I was concerned about ripping the  duct if I tried to clip anything close to it. I opted to do a subtotal cholecystectomy given the occlusion of the cystic duct and the gangrenous changes.  This cholecystectomy took extensive time and was very  difficult and tenuous, requiring care dissection and manipulation of tissue.   I scrubbed back in and upsized the left most port to a 12. I then kept the gallbladder on a pedicle, not seeing any bile ducts behind my stapler and noting the common bile duct deep and safe to my stapler on Firefly. I used a white load and came across the infundibulum. I had to come across it again to complete the transection with another staple load. In the end 1cm of infundibulum was left in my approximation.     Hemostasis was checked prior to removing the hook cautery.  I irrigated and suctioned out the area.  A 5mm Endo Catch bag was then placed through the left side port.  There was no evidence of bleeding from the gallbladder fossa and no signs of any bile leaking from the staple line or surrounding area. The [employer] was undocked and moved out of the field,  and the gallbladder was removed in the bag.  I placed [employer] in the fossa, and brought out a JP from the mid clavicular right sided port.  The gallbladder was passed off the table as a specimen. The left port site closed with a 0 vicryl and PMI due to dilation from removing the gallbladder. The JP was secured with 3-0 Nylon. The abdomen was desufflated and secondary trocars were removed under direct vision.   No bleeding was noted. All skin incisions were closed with staples and gauze/ tegaderm dressing.   During the case an OG was placed initially and 2.5L of gastric contents were evacuated. This was changed for an NG and will remain in place. Final inspection revealed acceptable hemostasis. All counts were correct at the end of the case. The patient was awakened from anesthesia and extubated without complication.  The patient went to the PACU in stable condition.   Algis Greenhouse, MD [facility] [address] [place], [place] [address] [phone] (office)

## 2023-07-28 DIAGNOSIS — K810 Acute cholecystitis: Secondary | ICD-10-CM | POA: Diagnosis not present

## 2023-07-28 LAB — HEMOGLOBIN A1C
Hgb A1c MFr Bld: 10.2 % — ABNORMAL HIGH (ref 4.8–5.6)
Mean Plasma Glucose: 246 mg/dL

## 2023-07-28 LAB — CBC
HCT: 40.1 % (ref 39.0–52.0)
Hemoglobin: 12.9 g/dL — ABNORMAL LOW (ref 13.0–17.0)
MCH: 28.3 pg (ref 26.0–34.0)
MCHC: 32.2 g/dL (ref 30.0–36.0)
MCV: 87.9 fL (ref 80.0–100.0)
Platelets: 270 10*3/uL (ref 150–400)
RBC: 4.56 MIL/uL (ref 4.22–5.81)
RDW: 13.2 % (ref 11.5–15.5)
WBC: 12.4 10*3/uL — ABNORMAL HIGH (ref 4.0–10.5)
nRBC: 0 % (ref 0.0–0.2)

## 2023-07-28 LAB — COMPREHENSIVE METABOLIC PANEL
ALT: 93 U/L — ABNORMAL HIGH (ref 0–44)
AST: 131 U/L — ABNORMAL HIGH (ref 15–41)
Albumin: 2.3 g/dL — ABNORMAL LOW (ref 3.5–5.0)
Alkaline Phosphatase: 123 U/L (ref 38–126)
Anion gap: 10 (ref 5–15)
BUN: 21 mg/dL (ref 8–23)
CO2: 26 mmol/L (ref 22–32)
Calcium: 8.1 mg/dL — ABNORMAL LOW (ref 8.9–10.3)
Chloride: 96 mmol/L — ABNORMAL LOW (ref 98–111)
Creatinine, Ser: 0.87 mg/dL (ref 0.61–1.24)
GFR, Estimated: >60 mL/min (ref >60)
Glucose, Bld: 264 mg/dL — ABNORMAL HIGH (ref 70–99)
Potassium: 4.1 mmol/L (ref 3.5–5.1)
Sodium: 132 mmol/L — ABNORMAL LOW (ref 135–145)
Total Bilirubin: 0.7 mg/dL (ref 0.3–1.2)
Total Protein: 5.8 g/dL — ABNORMAL LOW (ref 6.5–8.1)

## 2023-07-28 LAB — MAGNESIUM: Magnesium: 2.2 mg/dL (ref 1.7–2.4)

## 2023-07-28 LAB — GLUCOSE, CAPILLARY
Glucose-Capillary: 284 mg/dL — ABNORMAL HIGH (ref 70–99)
Glucose-Capillary: 289 mg/dL — ABNORMAL HIGH (ref 70–99)
Glucose-Capillary: 230 mg/dL — ABNORMAL HIGH (ref 70–99)
Glucose-Capillary: 154 mg/dL — ABNORMAL HIGH (ref 70–99)

## 2023-07-28 MED ORDER — SODIUM CHLORIDE 0.9 % IV SOLN: INTRAVENOUS | Status: AC

## 2023-07-28 MED ORDER — INSULIN DETEMIR 100 UNIT/ML ~~LOC~~ SOLN
25.0000 [IU] | Freq: Every day | SUBCUTANEOUS | Status: DC
  Administered 2023-07-28 – 2023-07-30 (×3): 25 [IU] via SUBCUTANEOUS
  Filled 2023-07-28 (×4): qty 0.25

## 2023-07-28 MED ORDER — DEXTROSE-SODIUM CHLORIDE 5-0.45 % IV SOLN: INTRAVENOUS | Status: DC

## 2023-07-28 NOTE — Progress Notes (Signed)
During this rounding, patient mentioned that his right thigh feels numb. Patient repositioned himself in bed. I told patient to let me know if it begins to feel worse. Plan of care ongoing.

## 2023-07-28 NOTE — Progress Notes (Signed)
[facility]  Doing fair. Wants NG out. No flatus or BM.   BP 115/73 (BP Location: Left Arm)   Pulse 95   Temp 99.3 F (37.4 C) (Oral)   Resp 17   Ht 5\' 7"  (1.702 m)   Wt 106.4 kg   SpO2 92%   BMI 36.74 kg/m  Soft, gauze dressing in place, JP with SS fluid, mildly distended, appropriately tender  Patient s/p robotic assisted subtotal cholecystectomy for gangrenous cholecystitis. Doing well. WBC down.  PRN For pain NG NPO due to ileus Meropeneum for gangrenous cholecystitis for 5 days post op Discussed subtotal cholecystectomy with patient and family and low risk of 5% Of recurrent cholecystitis. Concern that staple line could break down and reason for the JP drain and plan for HIDA before removal.  Algis Greenhouse, MD [facility] [address] [place], [place] [address] [phone] (office)

## 2023-07-28 NOTE — Progress Notes (Signed)
Patient has no complaint of pain in abd during this shift. Patient has only complained of a sore, irritated throat. Prn throat spray provided from standing order set. NG tube output is 350 ml. On every round, patient has  asked to have something to drink or ice. This Clinical research associate and nurse tech have talked with patient about his strict NPO diet and informed him the MD had decreation with diet orders. Plan of care ongoing.

## 2023-07-28 NOTE — Hospital Course (Signed)
  Brief Narrative:    62 year old with history of anxiety, DM 2, HLD, CAD status post CABG comes to ED with complaints of lower back pain and abdominal pain.  Workup including CT abdomen pelvis showed acute cholecystitis, general surgery was consulted.  Patient was taken for laparoscopic cholecystectomy which showed acute gangrenous cholecystitis and underwent subtotal cholecystectomy and developed ileus.  Drains were left in place.   Assessment & Plan:  Principal Problem:   Acute cholecystitis Active Problems:   Mixed hyperlipidemia   Coronary artery disease   Type II diabetes mellitus with complication (HCC)   Anxiety   GERD (gastroesophageal reflux disease)    Acute gangrenous cholecystitis Transaminitis Status post subtotal cholecystectomy by general surgery on 9/12.  Postop recovery.  Postop antibiotic, at least 5 days.  Right upper quadrant drain in place  Postoperative ileus - NG tube out, slowly advance diet as tolerated.   GERD (gastroesophageal reflux disease) PPI   Anxiety Manage as needed   Type II diabetes mellitus with complication (HCC) Adjust long-acting insulin and sliding scale as necessary.   Coronary artery disease - Continue beta-blocker, aspirin and statin.  IV as needed   Mixed hyperlipidemia - Continue Crestor       DVT prophylaxis: SCDs Start: 07/27/23 0022 Code Status: Full code Family Communication:  Family at bedside Status is: Inpatient Routine postop management.  Hopefully discharge tomorrow per general surgery.

## 2023-07-28 NOTE — Progress Notes (Signed)
PROGRESS NOTE    Jacob Acosta  FAO:[id number] DOB: 03-15-1961 DOA: 07/26/2023 PCP: Benita Stabile, MD     Brief Narrative:    62 year old with history of anxiety, DM 2, HLD, CAD status post CABG comes to ED with complaints of lower back pain and abdominal pain.  Workup including CT abdomen pelvis showed acute cholecystitis, general surgery was consulted.  Patient was taken for laparoscopic cholecystectomy which showed acute gangrenous cholecystitis and underwent subtotal cholecystectomy and developed ileus.  Drains were left in place.   Assessment & Plan:  Principal Problem:   Acute cholecystitis Active Problems:   Mixed hyperlipidemia   Coronary artery disease   Type II diabetes mellitus with complication (HCC)   Anxiety   GERD (gastroesophageal reflux disease)    Acute gangrenous cholecystitis Transaminitis Status post subtotal cholecystectomy by general surgery on 9/12.  Postop recovery.  Currently right lower quadrant drain in place.  Continue antibiotic, at least 5 days.  Postoperative ileus - NG tube in place.  Management per general surgery.   GERD (gastroesophageal reflux disease) PPI   Anxiety Manage as needed   Type II diabetes mellitus with complication (HCC) Currently n.p.o. therefore Levemir reduced to 20 units at bedtime, sliding scale.  Adjust as necessary r   Coronary artery disease - Continue beta-blocker, aspirin and statin.  IV as needed   Mixed hyperlipidemia - Continue Crestor       DVT prophylaxis: SCDs Start: 07/27/23 0022 Code Status: Full code Family Communication:  Family at bedside Status is: Inpatient Routine postop management.       Subjective: Seen at bedside, tolerating NG tube well.  Right upper quadrant drain in place.  No other complaints at this time.   Examination:  General exam: Appears calm and comfortable, NG tube in place Respiratory system: Clear to auscultation. Respiratory effort normal. Cardiovascular system:  S1 & S2 heard, RRR. No JVD, murmurs, rubs, gallops or clicks. No pedal edema. Gastrointestinal system: Abdomen slightly distended, no bowel sounds heard. Central nervous system: Alert and oriented. No focal neurological deficits. Extremities: Symmetric 5 x 5 power. Skin: No rashes, lesions or ulcers Psychiatry: Judgement and insight appear normal. Mood & affect appropriate.       Diet Orders (From admission, onward)     Start     Ordered   07/28/23 0747  Diet NPO time specified Except for: Ice Chips, Sips with Meds  Diet effective now       Question Answer Comment  Except for Ice Chips   Except for Sips with Meds      07/28/23 0746            Objective: Vitals:   07/27/23 1830 07/27/23 2049 07/28/23 0336 07/28/23 1100  BP: 132/69 (!) 145/77 112/65 115/73  Pulse: (!) 108 (!) 126 (!) 121 95  Resp: (!) 21 18 20 17   Temp:  98.9 F (37.2 C) 99.7 F (37.6 C) 99.3 F (37.4 C)  TempSrc:  Oral Oral Oral  SpO2:  92% 92% 92%  Weight:      Height:        Intake/Output Summary (Last 24 hours) at 07/28/2023 1257 Last data filed at 07/28/2023 6962 Gross per 24 hour  Intake 2080.85 ml  Output 580 ml  Net 1500.85 ml   Filed Weights   07/26/23 1733 07/27/23 0025 07/27/23 1430  Weight: 104 kg 106.4 kg 106.4 kg    Scheduled Meds:  aspirin  81 mg Oral Daily   insulin aspart  0-15 Units Subcutaneous TID WC   insulin aspart  0-5 Units Subcutaneous QHS   insulin detemir  20 Units Subcutaneous QHS   metoprolol tartrate  25 mg Oral BID   pantoprazole  40 mg Oral Daily   rosuvastatin  40 mg Oral Daily   Continuous Infusions:  dextrose 5 % and 0.45 % NaCl 75 mL/hr at 07/28/23 1024   meropenem (MERREM) IV 1 g (07/28/23 1241)    Nutritional status     Body mass index is 36.74 kg/m.  Data Reviewed:   CBC: Recent Labs  Lab 07/26/23 0315 07/26/23 1802 07/27/23 0411 07/28/23 0447  WBC 10.9* 18.2* 22.5* 12.4*  NEUTROABS  --  15.9* 19.3*  --   HGB 13.5 14.5 14.3 12.9*   HCT 39.9 42.8 42.9 40.1  MCV 86.6 85.9 86.7 87.9  PLT 228 265 269 270   Basic Metabolic Panel: Recent Labs  Lab 07/26/23 0315 07/26/23 1802 07/27/23 0411 07/28/23 0447  NA 134* 133* 130* 132*  K 3.9 3.7 3.7 4.1  CL 101 94* 95* 96*  CO2 26 25 24 26   GLUCOSE 155* 221* 215* 264*  BUN 12 15 16 21   CREATININE 0.70 0.71 0.63 0.87  CALCIUM 8.6* 9.0 8.6* 8.1*  MG  --   --  1.9 2.2   GFR: Estimated Creatinine Clearance: 101 mL/min (by C-G formula based on SCr of 0.87 mg/dL). Liver Function Tests: Recent Labs  Lab 07/26/23 0315 07/26/23 1802 07/27/23 0411 07/28/23 0447  AST 16 18 23  131*  ALT 21 22 22  93*  ALKPHOS 78 93 104 123  BILITOT 0.9 1.7* 1.2 0.7  PROT 6.5 7.3 6.9 5.8*  ALBUMIN 3.3* 3.5 3.2* 2.3*   Recent Labs  Lab 07/26/23 0315 07/26/23 1802  LIPASE 17 17   No results for input(s): "AMMONIA" in the last 168 hours. Coagulation Profile: No results for input(s): "INR", "PROTIME" in the last 168 hours. Cardiac Enzymes: No results for input(s): "CKTOTAL", "CKMB", "CKMBINDEX", "TROPONINI" in the last 168 hours. BNP (last 3 results) No results for input(s): "PROBNP" in the last 8760 hours. HbA1C: Recent Labs    07/27/23 0411  HGBA1C 10.2*   CBG: Recent Labs  Lab 07/27/23 0724 07/27/23 1232 07/27/23 2238 07/28/23 0735 07/28/23 1041  GLUCAP 219* 200* 275* 284* 289*   Lipid Profile: No results for input(s): "CHOL", "HDL", "LDLCALC", "TRIG", "CHOLHDL", "LDLDIRECT" in the last 72 hours. Thyroid Function Tests: No results for input(s): "TSH", "T4TOTAL", "FREET4", "T3FREE", "THYROIDAB" in the last 72 hours. Anemia Panel: No results for input(s): "VITAMINB12", "FOLATE", "FERRITIN", "TIBC", "IRON", "RETICCTPCT" in the last 72 hours. Sepsis Labs: No results for input(s): "PROCALCITON", "LATICACIDVEN" in the last 168 hours.  No results found for this or any previous visit (from the past 240 hour(s)).       Radiology Studies: DG Chest Port 1  View  Result Date: 07/27/2023 CLINICAL DATA:  NG tube placement, ileus. EXAM: PORTABLE CHEST 1 VIEW COMPARISON:  07/26/2023. FINDINGS: The heart size and mediastinal contours are stable. Lung volumes are low. Patchy airspace disease is present in the right upper lobe. Blunting of the right costophrenic angle is noted. No pneumothorax is seen. An enteric tube terminates in the stomach and appears appropriate in position. Sternotomy wires are noted over the midline. IMPRESSION: 1. Patchy airspace disease in the right upper lobe, concerning for developing pneumonia. 2. Blunting of the right costophrenic angle, possible atelectasis, scarring, or effusion. 3. NG tube terminates in the stomach and appears appropriate in position.  Electronically Signed   By: Thornell Sartorius M.D.   On: 07/27/2023 20:19   CT ABDOMEN PELVIS W CONTRAST  Result Date: 07/26/2023 CLINICAL DATA:  Epigastric pain. EXAM: CT ABDOMEN AND PELVIS WITH CONTRAST TECHNIQUE: Multidetector CT imaging of the abdomen and pelvis was performed using the standard protocol following bolus administration of intravenous contrast. RADIATION DOSE REDUCTION: This exam was performed according to the departmental dose-optimization program which includes automated exposure control, adjustment of the mA and/or kV according to patient size and/or use of iterative reconstruction technique. CONTRAST:  OMNIPAQUE IOHEXOL 300 MG/ML  SOLN COMPARISON:  None Available. FINDINGS: Lower chest: There are bibasilar linear atelectasis/scarring. The visualized lung bases are otherwise clear. There is coronary vascular calcification. No intra-abdominal free air or free fluid. Hepatobiliary: Apparent mild fatty liver. No biliary dilatation. The gallbladder is distended. No calcified gallstone. There is however inflammatory changes of the gallbladder wall and stranding of the pericholecystic fat most consistent with acute cholecystitis. Further evaluation with ultrasound  recommended. Pancreas: Moderate pancreatic atrophy. No active inflammatory changes. No dilatation of the main pancreatic duct. Spleen: Normal in size without focal abnormality. Adrenals/Urinary Tract: The adrenal glands unremarkable. There is no hydronephrosis on either side. There is symmetric enhancement and excretion of contrast by both kidneys. The visualized ureters and urinary bladder appear unremarkable. Stomach/Bowel: There is no bowel obstruction or active inflammation. There is mild distension of the stomach. The appendix is normal. Vascular/Lymphatic: The abdominal aorta and IVC are unremarkable. No portal venous gas. There is no adenopathy. Reproductive: The prostate and seminal vesicles are grossly unremarkable. No pelvic mass. Other: None Musculoskeletal: No acute or significant osseous findings. IMPRESSION: 1. Findings most consistent with acute cholecystitis. Further evaluation with ultrasound recommended. 2. No bowel obstruction. Normal appendix. Electronically Signed   By: Elgie Collard M.D.   On: 07/26/2023 19:44           LOS: 1 day   Time spent= 35 mins    Miguel Rota, MD [facility]  If 7PM-7AM, please contact night-coverage  07/28/2023, 12:57 PM

## 2023-07-28 NOTE — Inpatient Diabetes Management (Signed)
Inpatient Diabetes Program Recommendations  AACE/ADA: New Consensus Statement on Inpatient Glycemic Control (2015)  Target Ranges:  Prepandial:   less than 140 mg/dL      Peak postprandial:   less than 180 mg/dL (1-2 hours)      Critically ill patients:  140 - 180 mg/dL   Lab Results  Component Value Date   GLUCAP 289 (H) 07/28/2023   HGBA1C 10.2 (H) 07/27/2023    Latest Reference Range & Units 07/27/23 12:32 07/27/23 22:38 07/28/23 07:35 07/28/23 10:41  Glucose-Capillary 70 - 99 mg/dL 782 (H) 956 (H) 213 (H) 289 (H)  (H): Data is abnormally high Review of Glycemic Control  Diabetes history: type 2 Outpatient Diabetes medications: Basaglar 30-40 units daily Current orders for Inpatient glycemic control: Levemir 20 units daily, Novolog 0-15 units correction scale TID, Novolog 0-5 units HS scale  Inpatient Diabetes Program Recommendations:    Noted that patient's CBGs have been greater than 200 mg/dl. Recommend increasing Levemir to 25 units daily if blood sugars continue to be elevated. Titrate dosages as needed.   Smith Mince RN BSN CDE Diabetes Coordinator Pager: [phone]  8am-5pm

## 2023-07-29 ENCOUNTER — Inpatient Hospital Stay (HOSPITAL_COMMUNITY): Payer: 59

## 2023-07-29 DIAGNOSIS — K810 Acute cholecystitis: Secondary | ICD-10-CM | POA: Diagnosis not present

## 2023-07-29 LAB — COMPREHENSIVE METABOLIC PANEL
ALT: 83 U/L — ABNORMAL HIGH (ref 0–44)
AST: 74 U/L — ABNORMAL HIGH (ref 15–41)
Albumin: 2.1 g/dL — ABNORMAL LOW (ref 3.5–5.0)
Alkaline Phosphatase: 138 U/L — ABNORMAL HIGH (ref 38–126)
Anion gap: 9 (ref 5–15)
BUN: 28 mg/dL — ABNORMAL HIGH (ref 8–23)
CO2: 30 mmol/L (ref 22–32)
Calcium: 8 mg/dL — ABNORMAL LOW (ref 8.9–10.3)
Chloride: 98 mmol/L (ref 98–111)
Creatinine, Ser: 0.83 mg/dL (ref 0.61–1.24)
GFR, Estimated: >60 mL/min (ref >60)
Glucose, Bld: 145 mg/dL — ABNORMAL HIGH (ref 70–99)
Potassium: 3.4 mmol/L — ABNORMAL LOW (ref 3.5–5.1)
Sodium: 137 mmol/L (ref 135–145)
Total Bilirubin: 0.4 mg/dL (ref 0.3–1.2)
Total Protein: 5.5 g/dL — ABNORMAL LOW (ref 6.5–8.1)

## 2023-07-29 LAB — CBC
HCT: 37.2 % — ABNORMAL LOW (ref 39.0–52.0)
Hemoglobin: 12.1 g/dL — ABNORMAL LOW (ref 13.0–17.0)
MCH: 29.1 pg (ref 26.0–34.0)
MCHC: 32.5 g/dL (ref 30.0–36.0)
MCV: 89.4 fL (ref 80.0–100.0)
Platelets: 276 10*3/uL (ref 150–400)
RBC: 4.16 MIL/uL — ABNORMAL LOW (ref 4.22–5.81)
RDW: 13.4 % (ref 11.5–15.5)
WBC: 10.1 10*3/uL (ref 4.0–10.5)
nRBC: 0 % (ref 0.0–0.2)

## 2023-07-29 LAB — MAGNESIUM: Magnesium: 2.3 mg/dL (ref 1.7–2.4)

## 2023-07-29 LAB — GLUCOSE, CAPILLARY
Glucose-Capillary: 125 mg/dL — ABNORMAL HIGH (ref 70–99)
Glucose-Capillary: 114 mg/dL — ABNORMAL HIGH (ref 70–99)
Glucose-Capillary: 123 mg/dL — ABNORMAL HIGH (ref 70–99)
Glucose-Capillary: 171 mg/dL — ABNORMAL HIGH (ref 70–99)

## 2023-07-29 MED ORDER — POTASSIUM CHLORIDE 10 MEQ/100ML IV SOLN
10.0000 meq | INTRAVENOUS | Status: AC
  Administered 2023-07-29 (×4): 10 meq via INTRAVENOUS
  Filled 2023-07-29 (×3): qty 100

## 2023-07-29 NOTE — Progress Notes (Signed)
[facility]  Doing well. No flatus but have bowel gas. Ng with less dark output. Getting up to walk. KUB with some gas in the colon.  BP 100/67 (BP Location: Left Arm)   Pulse 85   Temp 98.6 F (37 C) (Oral)   Resp 19   Ht 5\' 7"  (1.702 m)   Wt 106.4 kg   SpO2 92%   BMI 36.74 kg/m  Staples c/d/I JP with SS output  Patient s/p robotic assisted subtotal cholecystectomy for gangrenous cholecystitis. Doing well. WBC down.   PRN For pain NG NPO, NG to gravity and will trial getting it out later today Sips for now Meropeneum for gangrenous cholecystitis for 5 days post op Discussed subtotal cholecystectomy with patient and family and low risk of 5% Of recurrent cholecystitis. Concern that staple line could break down and reason for the JP drain and plan for HIDA before removal.  Algis Greenhouse, MD [facility] [address] [place], [place] [address] [phone] (office)

## 2023-07-29 NOTE — Progress Notes (Signed)
Patient walked around in room independently. Patient stood at the sink and shaved and brushed his teeth. Patient spouse remains at bedside. No complaints of pain, nausea, or vomiting. Plan of care ongoing.

## 2023-07-29 NOTE — Progress Notes (Signed)
NG tube removed after placing NG to gravity for 5 hours and when placed back to suction on 75ml of drainage in cannister. Patient ambulated in hallway X2 this shift. Patient reports flatus this afternoon X4.

## 2023-07-29 NOTE — Progress Notes (Signed)
Patient sat on the side of the bed x3 during the night. Tolerated well.  Plan of care ongoing.

## 2023-07-29 NOTE — Progress Notes (Signed)
PROGRESS NOTE    Jacob Acosta  ZOX:[id number] DOB: May 25, 1961 DOA: 07/26/2023 PCP: Benita Stabile, MD     Brief Narrative:    62 year old with history of anxiety, DM 2, HLD, CAD status post CABG comes to ED with complaints of lower back pain and abdominal pain.  Workup including CT abdomen pelvis showed acute cholecystitis, general surgery was consulted.  Patient was taken for laparoscopic cholecystectomy which showed acute gangrenous cholecystitis and underwent subtotal cholecystectomy and developed ileus.  Drains were left in place.   Assessment & Plan:  Principal Problem:   Acute cholecystitis Active Problems:   Mixed hyperlipidemia   Coronary artery disease   Type II diabetes mellitus with complication (HCC)   Anxiety   GERD (gastroesophageal reflux disease)    Acute gangrenous cholecystitis Transaminitis Status post subtotal cholecystectomy by general surgery on 9/12.  Postop recovery.  Currently right lower quadrant drain in place.  Continue antibiotic, at least 5 days.  Postoperative ileus - NG tube in place.  Management per general surgery.   GERD (gastroesophageal reflux disease) PPI   Anxiety Manage as needed   Type II diabetes mellitus with complication (HCC) Currently n.p.o. therefore Levemir reduced to 20 units at bedtime, sliding scale.  Adjust as necessary r   Coronary artery disease - Continue beta-blocker, aspirin and statin.  IV as needed   Mixed hyperlipidemia - Continue Crestor       DVT prophylaxis: SCDs Start: 07/27/23 0022 Code Status: Full code Family Communication:  Family at bedside Status is: Inpatient Routine postop management.      Subjective:  Seen at bedside.  Still not passing gas.  But feels slightly better.  Examination:  General exam: Appears calm and comfortable, NG tube in place Respiratory system: Clear to auscultation. Respiratory effort normal. Cardiovascular system: S1 & S2 heard, RRR. No JVD, murmurs, rubs,  gallops or clicks. No pedal edema. Gastrointestinal system: Some bowel sounds heard.  Still distended. Central nervous system: Alert and oriented. No focal neurological deficits. Extremities: Symmetric 5 x 5 power. Skin: No rashes, lesions or ulcers Psychiatry: Judgement and insight appear normal. Mood & affect appropriate.       Diet Orders (From admission, onward)     Start     Ordered   07/28/23 0747  Diet NPO time specified Except for: Ice Chips, Sips with Meds  Diet effective now       Question Answer Comment  Except for Ice Chips   Except for Sips with Meds      07/28/23 0746            Objective: Vitals:   07/28/23 0336 07/28/23 1100 07/28/23 2058 07/29/23 0348  BP: 112/65 115/73 100/61 100/67  Pulse: (!) 121 95 97 85  Resp: 20 17 20 19   Temp: 99.7 F (37.6 C) 99.3 F (37.4 C) 99.5 F (37.5 C) 98.6 F (37 C)  TempSrc: Oral Oral  Oral  SpO2: 92% 92% 91% 92%  Weight:      Height:        Intake/Output Summary (Last 24 hours) at 07/29/2023 0833 Last data filed at 07/29/2023 0355 Gross per 24 hour  Intake --  Output 830 ml  Net -830 ml   Filed Weights   07/26/23 1733 07/27/23 0025 07/27/23 1430  Weight: 104 kg 106.4 kg 106.4 kg    Scheduled Meds:  aspirin  81 mg Oral Daily   insulin aspart  0-15 Units Subcutaneous TID WC   insulin aspart  0-5  Units Subcutaneous QHS   insulin detemir  25 Units Subcutaneous QHS   metoprolol tartrate  25 mg Oral BID   pantoprazole  40 mg Oral Daily   rosuvastatin  40 mg Oral Daily   Continuous Infusions:  sodium chloride 75 mL/hr at 07/29/23 0346   meropenem (MERREM) IV 1 g (07/29/23 0346)   potassium chloride      Nutritional status     Body mass index is 36.74 kg/m.  Data Reviewed:   CBC: Recent Labs  Lab 07/26/23 0315 07/26/23 1802 07/27/23 0411 07/28/23 0447 07/29/23 0440  WBC 10.9* 18.2* 22.5* 12.4* 10.1  NEUTROABS  --  15.9* 19.3*  --   --   HGB 13.5 14.5 14.3 12.9* 12.1*  HCT 39.9 42.8 42.9  40.1 37.2*  MCV 86.6 85.9 86.7 87.9 89.4  PLT 228 265 269 270 276   Basic Metabolic Panel: Recent Labs  Lab 07/26/23 0315 07/26/23 1802 07/27/23 0411 07/28/23 0447 07/29/23 0440  NA 134* 133* 130* 132* 137  K 3.9 3.7 3.7 4.1 3.4*  CL 101 94* 95* 96* 98  CO2 26 25 24 26 30   GLUCOSE 155* 221* 215* 264* 145*  BUN 12 15 16 21  28*  CREATININE 0.70 0.71 0.63 0.87 0.83  CALCIUM 8.6* 9.0 8.6* 8.1* 8.0*  MG  --   --  1.9 2.2 2.3   GFR: Estimated Creatinine Clearance: 105.9 mL/min (by C-G formula based on SCr of 0.83 mg/dL). Liver Function Tests: Recent Labs  Lab 07/26/23 0315 07/26/23 1802 07/27/23 0411 07/28/23 0447 07/29/23 0440  AST 16 18 23  131* 74*  ALT 21 22 22  93* 83*  ALKPHOS 78 93 104 123 138*  BILITOT 0.9 1.7* 1.2 0.7 0.4  PROT 6.5 7.3 6.9 5.8* 5.5*  ALBUMIN 3.3* 3.5 3.2* 2.3* 2.1*   Recent Labs  Lab 07/26/23 0315 07/26/23 1802  LIPASE 17 17   No results for input(s): "AMMONIA" in the last 168 hours. Coagulation Profile: No results for input(s): "INR", "PROTIME" in the last 168 hours. Cardiac Enzymes: No results for input(s): "CKTOTAL", "CKMB", "CKMBINDEX", "TROPONINI" in the last 168 hours. BNP (last 3 results) No results for input(s): "PROBNP" in the last 8760 hours. HbA1C: Recent Labs    07/27/23 0411  HGBA1C 10.2*   CBG: Recent Labs  Lab 07/28/23 0735 07/28/23 1041 07/28/23 1644 07/28/23 2100 07/29/23 0654  GLUCAP 284* 289* 230* 154* 125*   Lipid Profile: No results for input(s): "CHOL", "HDL", "LDLCALC", "TRIG", "CHOLHDL", "LDLDIRECT" in the last 72 hours. Thyroid Function Tests: No results for input(s): "TSH", "T4TOTAL", "FREET4", "T3FREE", "THYROIDAB" in the last 72 hours. Anemia Panel: No results for input(s): "VITAMINB12", "FOLATE", "FERRITIN", "TIBC", "IRON", "RETICCTPCT" in the last 72 hours. Sepsis Labs: No results for input(s): "PROCALCITON", "LATICACIDVEN" in the last 168 hours.  No results found for this or any previous  visit (from the past 240 hour(s)).       Radiology Studies: DG Chest Port 1 View  Result Date: 07/27/2023 CLINICAL DATA:  NG tube placement, ileus. EXAM: PORTABLE CHEST 1 VIEW COMPARISON:  07/26/2023. FINDINGS: The heart size and mediastinal contours are stable. Lung volumes are low. Patchy airspace disease is present in the right upper lobe. Blunting of the right costophrenic angle is noted. No pneumothorax is seen. An enteric tube terminates in the stomach and appears appropriate in position. Sternotomy wires are noted over the midline. IMPRESSION: 1. Patchy airspace disease in the right upper lobe, concerning for developing pneumonia. 2. Blunting of the right  costophrenic angle, possible atelectasis, scarring, or effusion. 3. NG tube terminates in the stomach and appears appropriate in position. Electronically Signed   By: Thornell Sartorius M.D.   On: 07/27/2023 20:19           LOS: 3 days   Time spent= 35 mins    Miguel Rota, MD [facility]  If 7PM-7AM, please contact night-coverage  07/29/2023, 8:33 AM

## 2023-07-30 DIAGNOSIS — K810 Acute cholecystitis: Secondary | ICD-10-CM | POA: Diagnosis not present

## 2023-07-30 LAB — COMPREHENSIVE METABOLIC PANEL
ALT: 166 U/L — ABNORMAL HIGH (ref 0–44)
AST: 192 U/L — ABNORMAL HIGH (ref 15–41)
Albumin: 2.2 g/dL — ABNORMAL LOW (ref 3.5–5.0)
Alkaline Phosphatase: 241 U/L — ABNORMAL HIGH (ref 38–126)
Anion gap: 10 (ref 5–15)
BUN: 22 mg/dL (ref 8–23)
CO2: 26 mmol/L (ref 22–32)
Calcium: 7.8 mg/dL — ABNORMAL LOW (ref 8.9–10.3)
Chloride: 100 mmol/L (ref 98–111)
Creatinine, Ser: 0.74 mg/dL (ref 0.61–1.24)
GFR, Estimated: >60 mL/min (ref >60)
Glucose, Bld: 109 mg/dL — ABNORMAL HIGH (ref 70–99)
Potassium: 3.4 mmol/L — ABNORMAL LOW (ref 3.5–5.1)
Sodium: 136 mmol/L (ref 135–145)
Total Bilirubin: 0.4 mg/dL (ref 0.3–1.2)
Total Protein: 5.5 g/dL — ABNORMAL LOW (ref 6.5–8.1)

## 2023-07-30 LAB — CBC
HCT: 39.1 % (ref 39.0–52.0)
Hemoglobin: 12.4 g/dL — ABNORMAL LOW (ref 13.0–17.0)
MCH: 28.3 pg (ref 26.0–34.0)
MCHC: 31.7 g/dL (ref 30.0–36.0)
MCV: 89.3 fL (ref 80.0–100.0)
Platelets: 279 10*3/uL (ref 150–400)
RBC: 4.38 MIL/uL (ref 4.22–5.81)
RDW: 13.2 % (ref 11.5–15.5)
WBC: 9.4 10*3/uL (ref 4.0–10.5)
nRBC: 0 % (ref 0.0–0.2)

## 2023-07-30 LAB — GLUCOSE, CAPILLARY
Glucose-Capillary: 84 mg/dL (ref 70–99)
Glucose-Capillary: 98 mg/dL (ref 70–99)
Glucose-Capillary: 157 mg/dL — ABNORMAL HIGH (ref 70–99)
Glucose-Capillary: 215 mg/dL — ABNORMAL HIGH (ref 70–99)

## 2023-07-30 LAB — MAGNESIUM: Magnesium: 2.2 mg/dL (ref 1.7–2.4)

## 2023-07-30 MED ORDER — AMOXICILLIN 250 MG PO CAPS
500.0000 mg | ORAL_CAPSULE | Freq: Once | ORAL | Status: AC
  Administered 2023-07-30: 500 mg via ORAL
  Filled 2023-07-30: qty 2

## 2023-07-30 MED ORDER — EPINEPHRINE 0.3 MG/0.3ML IJ SOAJ
0.3000 mg | Freq: Once | INTRAMUSCULAR | Status: DC | PRN
  Filled 2023-07-30: qty 0.3

## 2023-07-30 MED ORDER — DIPHENHYDRAMINE HCL 50 MG/ML IJ SOLN
25.0000 mg | Freq: Once | INTRAMUSCULAR | Status: DC | PRN
  Filled 2023-07-30: qty 1

## 2023-07-30 MED ORDER — POTASSIUM CHLORIDE 10 MEQ/100ML IV SOLN
10.0000 meq | INTRAVENOUS | Status: AC
  Administered 2023-07-30 (×4): 10 meq via INTRAVENOUS
  Filled 2023-07-30 (×4): qty 100

## 2023-07-30 MED ORDER — DOCUSATE SODIUM 100 MG PO CAPS
100.0000 mg | ORAL_CAPSULE | Freq: Two times a day (BID) | ORAL | Status: DC
  Administered 2023-07-30: 100 mg via ORAL
  Filled 2023-07-30 (×3): qty 1

## 2023-07-30 NOTE — Progress Notes (Signed)
[facility]  Doing well. Had BM. Advanced to full liquid diet.   BP 136/68 (BP Location: Left Arm)   Pulse 67   Temp 98.1 F (36.7 C) (Oral)   Resp 17   Ht 5\' 7"  (1.702 m)   Wt 106.4 kg   SpO2 96%   BMI 36.74 kg/m  Soft, nondistended, JP with Ss output, staples c/d/I with no erythema or drainage  Patient s/p robotic assisted subtotal cholecystectomy for gangrenous cholecystitis. Doing well.    PRN For pain Full liquid, adv as tolerated  Meropeneum for gangrenous cholecystitis for 5 days post op Trial for PCN allergy Discussed subtotal cholecystectomy with patient and family and low risk of 5% Of recurrent cholecystitis and need for them to know he had subtotal in order to tell future physicians  Concern that staple line could break down and reason for the JP drain and plan for HIDA before removal.  Algis Greenhouse, MD [facility] [address] [place], [place] [address] [phone] (office)

## 2023-07-30 NOTE — Anesthesia Postprocedure Evaluation (Signed)
Anesthesia Post Note  Patient: Jacob Acosta  Procedure(s) Performed: XI ROBOTIC ASSISTED LAPAROSCOPIC SUBTOTAL CHOLECYSTECTOMY (Abdomen)  Patient location during evaluation: Phase II Anesthesia Type: General Level of consciousness: awake Pain management: pain level controlled Vital Signs Assessment: post-procedure vital signs reviewed and stable Respiratory status: spontaneous breathing and respiratory function stable Cardiovascular status: blood pressure returned to baseline and stable Postop Assessment: no headache and no apparent nausea or vomiting Anesthetic complications: no Comments: Late entry   No notable events documented.   Last Vitals:  Vitals:   07/29/23 2107 07/30/23 0621  BP: 121/74 128/78  Pulse: 87 68  Resp: 17   Temp:  37 C  SpO2: 96% 96%    Last Pain:  Vitals:   07/30/23 1191  TempSrc:   PainSc: 5                  Windell Norfolk

## 2023-07-30 NOTE — Progress Notes (Signed)
PROGRESS NOTE    Jacob Acosta  DGL:[id number] DOB: February 12, 1961 DOA: 07/26/2023 PCP: Benita Stabile, MD     Brief Narrative:    62 year old with history of anxiety, DM 2, HLD, CAD status post CABG comes to ED with complaints of lower back pain and abdominal pain.  Workup including CT abdomen pelvis showed acute cholecystitis, general surgery was consulted.  Patient was taken for laparoscopic cholecystectomy which showed acute gangrenous cholecystitis and underwent subtotal cholecystectomy and developed ileus.  Drains were left in place.   Assessment & Plan:  Principal Problem:   Acute cholecystitis Active Problems:   Mixed hyperlipidemia   Coronary artery disease   Type II diabetes mellitus with complication (HCC)   Anxiety   GERD (gastroesophageal reflux disease)    Acute gangrenous cholecystitis Transaminitis Status post subtotal cholecystectomy by general surgery on 9/12.  Postop recovery.  Postop antibiotic, at least 5 days.  Right upper quadrant drain in place  Postoperative ileus - NG tube out, slowly advance diet as tolerated.   GERD (gastroesophageal reflux disease) PPI   Anxiety Manage as needed   Type II diabetes mellitus with complication (HCC) Adjust long-acting insulin and sliding scale as necessary.   Coronary artery disease - Continue beta-blocker, aspirin and statin.  IV as needed   Mixed hyperlipidemia - Continue Crestor       DVT prophylaxis: SCDs Start: 07/27/23 0022 Code Status: Full code Family Communication:  Family at bedside Status is: Inpatient Routine postop management.  Hopefully discharge tomorrow per general surgery.           Subjective: Seen at bedside, NG tube removed.  Tolerating some oral diet.  Ambulating well without any acute issues.   Examination:  General exam: Appears calm and comfortable  Respiratory system: Clear to auscultation. Respiratory effort normal. Cardiovascular system: S1 & S2 heard, RRR. No JVD,  murmurs, rubs, gallops or clicks. No pedal edema. Gastrointestinal system: Abdomen is nondistended, soft and nontender. No organomegaly or masses felt. Normal bowel sounds heard. Central nervous system: Alert and oriented. No focal neurological deficits. Extremities: Symmetric 5 x 5 power. Skin: No rashes, lesions or ulcers Psychiatry: Judgement and insight appear normal. Mood & affect appropriate. Right upper quadrant drain in place.      Diet Orders (From admission, onward)     Start     Ordered   07/30/23 0854  Diet full liquid Room service appropriate? Yes; Fluid consistency: Thin  Diet effective now       Question Answer Comment  Room service appropriate? Yes   Fluid consistency: Thin      07/30/23 0853            Objective: Vitals:   07/29/23 0348 07/29/23 1153 07/29/23 2107 07/30/23 0621  BP: 100/67 122/67 121/74 128/78  Pulse: 85 85 87 68  Resp: 19 18 17    Temp: 98.6 F (37 C) 98.5 F (36.9 C)  98.6 F (37 C)  TempSrc: Oral Oral  Oral  SpO2: 92% 91% 96% 96%  Weight:      Height:        Intake/Output Summary (Last 24 hours) at 07/30/2023 1112 Last data filed at 07/30/2023 0425 Gross per 24 hour  Intake 300 ml  Output 330 ml  Net -30 ml   Filed Weights   07/26/23 1733 07/27/23 0025 07/27/23 1430  Weight: 104 kg 106.4 kg 106.4 kg    Scheduled Meds:  aspirin  81 mg Oral Daily   insulin aspart  0-15  Units Subcutaneous TID WC   insulin aspart  0-5 Units Subcutaneous QHS   insulin detemir  25 Units Subcutaneous QHS   metoprolol tartrate  25 mg Oral BID   pantoprazole  40 mg Oral Daily   rosuvastatin  40 mg Oral Daily   Continuous Infusions:  meropenem (MERREM) IV 1 g (07/30/23 0348)   potassium chloride 10 mEq (07/30/23 1050)    Nutritional status     Body mass index is 36.74 kg/m.  Data Reviewed:   CBC: Recent Labs  Lab 07/26/23 1802 07/27/23 0411 07/28/23 0447 07/29/23 0440 07/30/23 0508  WBC 18.2* 22.5* 12.4* 10.1 9.4  NEUTROABS  15.9* 19.3*  --   --   --   HGB 14.5 14.3 12.9* 12.1* 12.4*  HCT 42.8 42.9 40.1 37.2* 39.1  MCV 85.9 86.7 87.9 89.4 89.3  PLT 265 269 270 276 279   Basic Metabolic Panel: Recent Labs  Lab 07/26/23 1802 07/27/23 0411 07/28/23 0447 07/29/23 0440 07/30/23 0508  NA 133* 130* 132* 137 136  K 3.7 3.7 4.1 3.4* 3.4*  CL 94* 95* 96* 98 100  CO2 25 24 26 30 26   GLUCOSE 221* 215* 264* 145* 109*  BUN 15 16 21  28* 22  CREATININE 0.71 0.63 0.87 0.83 0.74  CALCIUM 9.0 8.6* 8.1* 8.0* 7.8*  MG  --  1.9 2.2 2.3 2.2   GFR: Estimated Creatinine Clearance: 109.9 mL/min (by C-G formula based on SCr of 0.74 mg/dL). Liver Function Tests: Recent Labs  Lab 07/26/23 1802 07/27/23 0411 07/28/23 0447 07/29/23 0440 07/30/23 0508  AST 18 23 131* 74* 192*  ALT 22 22 93* 83* 166*  ALKPHOS 93 104 123 138* 241*  BILITOT 1.7* 1.2 0.7 0.4 0.4  PROT 7.3 6.9 5.8* 5.5* 5.5*  ALBUMIN 3.5 3.2* 2.3* 2.1* 2.2*   Recent Labs  Lab 07/26/23 0315 07/26/23 1802  LIPASE 17 17   No results for input(s): "AMMONIA" in the last 168 hours. Coagulation Profile: No results for input(s): "INR", "PROTIME" in the last 168 hours. Cardiac Enzymes: No results for input(s): "CKTOTAL", "CKMB", "CKMBINDEX", "TROPONINI" in the last 168 hours. BNP (last 3 results) No results for input(s): "PROBNP" in the last 8760 hours. HbA1C: No results for input(s): "HGBA1C" in the last 72 hours. CBG: Recent Labs  Lab 07/29/23 0654 07/29/23 1130 07/29/23 1633 07/29/23 2118 07/30/23 0742  GLUCAP 125* 114* 123* 171* 84   Lipid Profile: No results for input(s): "CHOL", "HDL", "LDLCALC", "TRIG", "CHOLHDL", "LDLDIRECT" in the last 72 hours. Thyroid Function Tests: No results for input(s): "TSH", "T4TOTAL", "FREET4", "T3FREE", "THYROIDAB" in the last 72 hours. Anemia Panel: No results for input(s): "VITAMINB12", "FOLATE", "FERRITIN", "TIBC", "IRON", "RETICCTPCT" in the last 72 hours. Sepsis Labs: No results for input(s):  "PROCALCITON", "LATICACIDVEN" in the last 168 hours.  No results found for this or any previous visit (from the past 240 hour(s)).       Radiology Studies: DG Abd 1 View  Result Date: 07/29/2023 CLINICAL DATA:  Abdominal distension EXAM: ABDOMEN - 1 VIEW COMPARISON:  07/26/2023 FINDINGS: Nasogastric tube noted with tip in the stomach. Jackson-Pratt type drain projects over the right abdomen. Skin clips noted over the left abdomen. Scattered gas in nondilated bowel with some formed stool in the right colon. IMPRESSION: 1. Nonobstructive bowel gas pattern. 2. Nasogastric tube tip in the stomach. JP drain along the right abdomen. Electronically Signed   By: Gaylyn Rong M.D.   On: 07/29/2023 10:19  LOS: 4 days   Time spent= 35 mins    Miguel Rota, MD [facility]  If 7PM-7AM, please contact night-coverage  07/30/2023, 11:12 AM

## 2023-07-30 NOTE — Progress Notes (Signed)
Patient had a large bowel movement, no complaints of nausea or vomiting. Tolerated sips of clear liquids very well. Asked several times for a carbonated drink, advised against it due to risk of bloating. Plan of care ongoing.

## 2023-07-30 NOTE — Progress Notes (Signed)
Pt tolerated amoxicillin oral challenge with no issues. Vital signs stable, pt had no complaints thus far. Will continue to monitor.

## 2023-07-30 NOTE — Progress Notes (Signed)
Pharmacy Antibiotic Note  Jacob Acosta is a 62 y.o. male admitted on 07/26/2023 with  gangrenous cholecystitis . Patient has allergies to penicillins and cipro. Pharmacy has been consulted for meropenem dosing. Plan is for 5 days Merrem then transition to po tx  Plan: Meropenem 1000 mg IV every 8 hours. Monitor labs, c/s, and patient improvement. Plan for penicillin oral challenge today as requested by MD  Height: 5\' 7"  (170.2 cm) Weight: 106.4 kg (234 lb 9.1 oz) IBW/kg (Calculated) : 66.1  Temp (24hrs), Avg:98.6 F (37 C), Min:98.5 F (36.9 C), Max:98.6 F (37 C)  Recent Labs  Lab 07/26/23 1802 07/27/23 0411 07/28/23 0447 07/29/23 0440 07/30/23 0508  WBC 18.2* 22.5* 12.4* 10.1 9.4  CREATININE 0.71 0.63 0.87 0.83 0.74    Estimated Creatinine Clearance: 109.9 mL/min (by C-G formula based on SCr of 0.74 mg/dL).    Allergies  Allergen Reactions   Amoxicillin     Per pt. "Felt like TV was closing in"   Ciprofloxacin Hcl Other (See Comments)    Hallucinations    Codeine Anxiety and Other (See Comments)    "Causes him to feel very anxious"   Metformin And Related Nausea Only and Palpitations   Penicillins Rash   Toujeo Max Solostar [Insulin Glargine] Itching, Nausea Only, Palpitations and Other (See Comments)    Itching throat    Antimicrobials this admission: Merrem 9/12 >> Cefepime/flagyl 9/12 CTX 9/11  Microbiology results: None pending  Thank you for allowing pharmacy to be a part of this patient's care.  Elder Cyphers, BS Pharm D, BCPS Clinical Pharmacist 07/30/2023 11:19 AM

## 2023-07-31 ENCOUNTER — Other Ambulatory Visit: Payer: Self-pay | Admitting: *Deleted

## 2023-07-31 ENCOUNTER — Ambulatory Visit: Payer: 59 | Admitting: Cardiovascular Disease

## 2023-07-31 DIAGNOSIS — K810 Acute cholecystitis: Secondary | ICD-10-CM | POA: Diagnosis not present

## 2023-07-31 DIAGNOSIS — Z9049 Acquired absence of other specified parts of digestive tract: Secondary | ICD-10-CM

## 2023-07-31 LAB — COMPREHENSIVE METABOLIC PANEL
ALT: 160 U/L — ABNORMAL HIGH (ref 0–44)
AST: 107 U/L — ABNORMAL HIGH (ref 15–41)
Albumin: 2.2 g/dL — ABNORMAL LOW (ref 3.5–5.0)
Alkaline Phosphatase: 268 U/L — ABNORMAL HIGH (ref 38–126)
Anion gap: 10 (ref 5–15)
BUN: 15 mg/dL (ref 8–23)
CO2: 25 mmol/L (ref 22–32)
Calcium: 8 mg/dL — ABNORMAL LOW (ref 8.9–10.3)
Chloride: 99 mmol/L (ref 98–111)
Creatinine, Ser: 0.8 mg/dL (ref 0.61–1.24)
GFR, Estimated: >60 mL/min (ref >60)
Glucose, Bld: 206 mg/dL — ABNORMAL HIGH (ref 70–99)
Potassium: 3.7 mmol/L (ref 3.5–5.1)
Sodium: 134 mmol/L — ABNORMAL LOW (ref 135–145)
Total Bilirubin: 0.5 mg/dL (ref 0.3–1.2)
Total Protein: 5.4 g/dL — ABNORMAL LOW (ref 6.5–8.1)

## 2023-07-31 LAB — SURGICAL PATHOLOGY

## 2023-07-31 LAB — CBC
HCT: 38.3 % — ABNORMAL LOW (ref 39.0–52.0)
Hemoglobin: 12.1 g/dL — ABNORMAL LOW (ref 13.0–17.0)
MCH: 28.1 pg (ref 26.0–34.0)
MCHC: 31.6 g/dL (ref 30.0–36.0)
MCV: 88.9 fL (ref 80.0–100.0)
Platelets: 308 10*3/uL (ref 150–400)
RBC: 4.31 MIL/uL (ref 4.22–5.81)
RDW: 13.1 % (ref 11.5–15.5)
WBC: 8.9 10*3/uL (ref 4.0–10.5)
nRBC: 0 % (ref 0.0–0.2)

## 2023-07-31 LAB — MAGNESIUM: Magnesium: 2 mg/dL (ref 1.7–2.4)

## 2023-07-31 LAB — GLUCOSE, CAPILLARY: Glucose-Capillary: 187 mg/dL — ABNORMAL HIGH (ref 70–99)

## 2023-07-31 MED ORDER — DOCUSATE SODIUM 100 MG PO CAPS: 100.0000 mg | ORAL_CAPSULE | Freq: Two times a day (BID) | ORAL | 0 refills | Status: AC | PRN

## 2023-07-31 MED ORDER — AMOXICILLIN-POT CLAVULANATE 875-125 MG PO TABS: 1.0000 | ORAL_TABLET | Freq: Two times a day (BID) | ORAL | 0 refills | Status: AC

## 2023-07-31 MED ORDER — OXYCODONE HCL 5 MG PO TABS
5.0000 mg | ORAL_TABLET | ORAL | 0 refills | Status: AC | PRN
Start: 2023-07-31 — End: 2024-07-30

## 2023-07-31 NOTE — Discharge Instructions (Addendum)
Discharge Robotic Assisted Laparoscopic Surgery Instructions:  Take Augmentin for the next 2 days.   JP DRAIN CARE Call if output becomes green in color.  Please keep the drain clean and dry. Replace the gauze/ tape around the drain if it gets dirty or wet/ saturated. Please do not mess with or cut the stitch that is keeping the drain in place. Secure the drain to your clothes so that it does not get dislodged.  You may want to wear a binder around your abdomen (girdle) at night for sleeping if you are worried about it getting pulled out.  Please record the output from the drain daily including the color and the amount in milliliters.  Please keep the drain covered with plastic and tape when you shower so that it does not get wet.   Common Complaints: Right shoulder pain is common after laparoscopic surgery.  This is secondary to the gas used in the surgery being trapped under the diaphragm.  Walk to help your body absorb the gas. This will improve in a few days. Pain at the port sites are common, especially the larger port sites. This will improve with time.  Some nausea is common and poor appetite. The main goal is to stay hydrated the first few days after surgery.   Diet/ Activity: Diet as tolerated. You may not have an appetite, but it is important to stay hydrated.  Drink 64 ounces of water a day. Your appetite will return with time.  Shower per your regular routine daily.  Do not take hot showers. Take warm showers that are less than 10 minutes. Rest and listen to your body, but do not remain in bed all day.  Walk everyday for at least 15-20 minutes. Deep cough and move around every 1-2 hours in the first few days after surgery.  Do not lift > 10 lbs, perform excessive bending, pushing, pulling, squatting for 1-2 weeks after surgery.  Do not pick at the dermabond glue on your incision sites.  This glue film will remain in place for 1-2 weeks and will start to peel off.  Do not  place lotions or balms on your incision unless instructed to specifically by Dr. Henreitta Leber.   Pain Expectations and Narcotics: -After surgery you will have pain associated with your incisions and this is normal. The pain is muscular and nerve pain, and will get better with time. -You are encouraged and expected to take non narcotic medications like tylenol and ibuprofen (when able) to treat pain as multiple modalities can aid with pain treatment. -Narcotics are only used when pain is severe or there is breakthrough pain. -You are not expected to have a pain score of 0 after surgery, as we cannot prevent pain. A pain score of 3-4 that allows you to be functional, move, walk, and tolerate some activity is the goal. The pain will continue to improve over the days after surgery and is dependent on your surgery. -Due to [place] law, we are only able to give a certain amount of pain medication to treat post operative pain, and we only give additional narcotics on a patient by patient basis.  -For most laparoscopic surgery, studies have shown that the majority of patients only need 10-15 narcotic pills, and for open surgeries most patients only need 15-20.   -Having appropriate expectations of pain and knowledge of pain management with non narcotics is important as we do not want anyone to become addicted to narcotic pain medication.  -Using ice  packs in the first 48 hours and heating pads after 48 hours, wearing an abdominal binder (when recommended), and using over the counter medications are all ways to help with pain management.   -Simple acts like meditation and mindfulness practices after surgery can also help with pain control and research has proven the benefit of these practices.  Medication: Take tylenol and ibuprofen as needed for pain control, alternating every 4-6 hours.  Example:  Tylenol 1000mg  @ 6am, 12noon, 6pm, (Do not exceed 4000mg  of tylenol a day). Ibuprofen 800mg  @ 9am, 3pm, 9pm,  3am (Do not exceed 3600mg  of ibuprofen a day).  Take Roxicodone for breakthrough pain every 4 hours.  Take Colace for constipation related to narcotic pain medication. If you do not have a bowel movement in 2 days, take Miralax over the counter.  Drink plenty of water to also prevent constipation.   Contact Information: If you have questions or concerns, please call our office, [phone], Monday- Thursday 8AM-5PM and Friday 8AM-12Noon.  If it is after hours or on the weekend, please call [facility] Main Number, [phone], [phone], and ask to speak to the surgeon on call for Dr. Henreitta Leber at [facility].

## 2023-07-31 NOTE — Progress Notes (Signed)
Went over discharge instructions w/ pt.

## 2023-07-31 NOTE — Plan of Care (Signed)
Problem: Clinical Measurements: Goal: Will remain free from infection Outcome: Not Progressing   Problem: Activity: Goal: Risk for activity intolerance will decrease Outcome: Not Progressing   Problem: Pain Managment: Goal: General experience of comfort will improve Outcome: Not Progressing

## 2023-07-31 NOTE — Progress Notes (Signed)
[facility]  Patient can dc home with JP drain. Needs Some Drain sponges and a cup to empty the drain and quantify the amount.  Taught patient how to strip the drain. Can shower but keep drain covered. Diet as tolerated. PRN for pain Augmentin for 2 days. Passed the allergy test and I removed the PCN allergy from the [facility].  Future Appointments  Date Time Provider Department Center  08/10/2023  2:15 PM Lucretia Roers, MD [facility] None  12/01/2023  8:20 AM Croitoru, Rachelle Hora, MD [facility] None   Will get HIDA before seeing me.  Algis Greenhouse, MD [facility] [address] [place] [place] [place], [place] [address] [phone] (office)

## 2023-07-31 NOTE — Progress Notes (Signed)
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 Camillo Flaming if there is a need for limb restriction and she said to implement it now.

## 2023-07-31 NOTE — Discharge Summary (Signed)
Physician Discharge Summary  Jacob Acosta ZOX:[id number] DOB: November 10, 1961 DOA: 07/26/2023  PCP: Benita Stabile, MD  Admit date: 07/26/2023 Discharge date: 07/31/2023  Admitted From: Home Disposition: Home  Recommendations for Outpatient Follow-up:  Follow up with PCP in 1-2 weeks Please obtain BMP/CBC in one week your next doctors visit.  Augmentin, pain medication and bowel regimen prescribed Drain instruction per general surgery  Discharge Condition: Stable CODE STATUS: Full code Diet recommendation: Cardiac low-fat   Brief Narrative:    62 year old with history of anxiety, DM 2, HLD, CAD status post CABG comes to ED with complaints of lower back pain and abdominal pain.  Workup including CT abdomen pelvis showed acute cholecystitis, general surgery was consulted.  Patient was taken for laparoscopic cholecystectomy which showed acute gangrenous cholecystitis and underwent subtotal cholecystectomy and developed ileus.  Drains were left in place.  Postop patient received IV meropenem and due to penicillin allergy he was tested in the hospital and did not have any reaction.  He has 2 more days of Augmentin remaining therefore we will discharge him on oral Augmentin, pain medication and bowel regimen with outpatient follow-up.   Assessment & Plan:  Principal Problem:   Acute cholecystitis Active Problems:   Mixed hyperlipidemia   Coronary artery disease   Type II diabetes mellitus with complication (HCC)   Anxiety   GERD (gastroesophageal reflux disease)    Acute gangrenous cholecystitis Transaminitis Status post subtotal cholecystectomy by general surgery on 9/12.  Postop recovery.  Patient did well with penicillin testing in the hospital therefore will transition IV meropenem to Augmentin for 3 more days.  Right upper quadrant drain to remain in place and managed by general surgery Pain medication with bowel regimen prescribed  Postoperative ileus - Resolved   GERD  (gastroesophageal reflux disease) PPI   Anxiety Manage as needed   Type II diabetes mellitus with complication (HCC) Resume home regimen   Coronary artery disease - Continue beta-blocker, aspirin and statin.  IV as needed   Mixed hyperlipidemia - Continue Crestor       DVT prophylaxis: SCDs Start: 07/27/23 0022 Code Status: Full code Family Communication:  Family at bedside Status is: Inpatient Routine postop management.  Discharge    Discharge Diagnoses:  Principal Problem:   Acute gangrenous cholecystitis Active Problems:   Mixed hyperlipidemia   Coronary artery disease   Type II diabetes mellitus with complication (HCC)   Anxiety   GERD (gastroesophageal reflux disease)   Ileus (HCC)   Acute cholecystitis      Consultations: General surgery  Subjective: Feeling well wishing to go home  Discharge Exam: Vitals:   07/30/23 2059 07/31/23 0435  BP: 131/72 134/74  Pulse: 86 69  Resp: 20 18  Temp: 98.2 F (36.8 C)   SpO2: 94% 96%   Vitals:   07/30/23 1749 07/30/23 1810 07/30/23 2059 07/31/23 0435  BP: 123/73 117/74 131/72 134/74  Pulse: 74 80 86 69  Resp: 17  20 18   Temp: 98 F (36.7 C)  98.2 F (36.8 C)   TempSrc: Oral Oral    SpO2: 95% 95% 94% 96%  Weight:      Height:        General: Pt is alert, awake, not in acute distress Cardiovascular: RRR, S1/S2 +, no rubs, no gallops Respiratory: CTA bilaterally, no wheezing, no rhonchi Abdominal: Soft, NT, ND, bowel sounds + Extremities: no edema, no cyanosis Right upper quadrant drain Discharge Instructions  Discharge Instructions     Discharge patient  Complete by: As directed    Discharge disposition: 01-Home or Self Care   Discharge patient date: 07/31/2023      Allergies as of 07/31/2023       Reactions   Ciprofloxacin Hcl Other (See Comments)   Hallucinations   Codeine Anxiety, Other (See Comments)   "Causes him to feel very anxious"   Metformin And Related Nausea Only,  Palpitations   Toujeo Max Solostar [insulin Glargine] Itching, Nausea Only, Palpitations, Other (See Comments)   Itching throat        Medication List     STOP taking these medications    ibuprofen 200 MG tablet Commonly known as: ADVIL       TAKE these medications    acetaminophen 325 MG tablet Commonly known as: TYLENOL Take 650 mg by mouth every 6 (six) hours as needed for mild pain.   amoxicillin-clavulanate 875-125 MG tablet Commonly known as: AUGMENTIN Take 1 tablet by mouth 2 (two) times daily for 2 days.   ascorbic acid 500 MG tablet Commonly known as: VITAMIN C Take 1,000 mg by mouth daily.   aspirin 81 MG tablet Take 1 tablet (81 mg total) by mouth daily.   Basaglar KwikPen 100 UNIT/ML Inject 30-34 Units into the skin in the morning and at bedtime.   CENTRUM SILVER 50+MEN PO Take 1 tablet by mouth daily.   docusate sodium 100 MG capsule Commonly known as: COLACE Take 1 capsule (100 mg total) by mouth 2 (two) times daily as needed for mild constipation.   ezetimibe 10 MG tablet Commonly known as: ZETIA TAKE 1 TABLET BY MOUTH EVERY DAY   metoprolol tartrate 25 MG tablet Commonly known as: LOPRESSOR TAKE 1 TABLET BY MOUTH TWICE A DAY   ondansetron 4 MG tablet Commonly known as: Zofran Take 1 tablet (4 mg total) by mouth every 8 (eight) hours as needed for nausea or vomiting.   oxyCODONE 5 MG immediate release tablet Commonly known as: Roxicodone Take 1 tablet (5 mg total) by mouth every 4 (four) hours as needed for breakthrough pain.   pantoprazole 40 MG tablet Commonly known as: Protonix Take 1 tablet (40 mg total) by mouth daily.   rosuvastatin 40 MG tablet Commonly known as: CRESTOR TAKE 1 TABLET BY MOUTH EVERY DAY   sucralfate 1 g tablet Commonly known as: Carafate Take 1 tablet (1 g total) by mouth 4 (four) times daily as needed.        Follow-up Information     Lucretia Roers, MD Follow up on 08/10/2023.   Specialty:  General Surgery Why: For staple and drain removal Contact information: [address] [place] [facility] [address] [phone]                Allergies  Allergen Reactions   Ciprofloxacin Hcl Other (See Comments)    Hallucinations    Codeine Anxiety and Other (See Comments)    "Causes him to feel very anxious"   Metformin And Related Nausea Only and Palpitations   Toujeo Max Solostar [Insulin Glargine] Itching, Nausea Only, Palpitations and Other (See Comments)    Itching throat    You were cared for by a hospitalist during your hospital stay. If you have any questions about your discharge medications or the care you received while you were in the hospital after you are discharged, you can call the unit and asked to speak with the hospitalist on call if the hospitalist that took care of you is not available. Once you are  discharged, your primary care physician will handle any further medical issues. Please note that no refills for any discharge medications will be authorized once you are discharged, as it is imperative that you return to your primary care physician (or establish a relationship with a primary care physician if you do not have one) for your aftercare needs so that they can reassess your need for medications and monitor your lab values.  You were cared for by a hospitalist during your hospital stay. If you have any questions about your discharge medications or the care you received while you were in the hospital after you are discharged, you can call the unit and asked to speak with the hospitalist on call if the hospitalist that took care of you is not available. Once you are discharged, your primary care physician will handle any further medical issues. Please note that NO REFILLS for any discharge medications will be authorized once you are discharged, as it is imperative that you return to your primary care physician (or establish a relationship with a primary care  physician if you do not have one) for your aftercare needs so that they can reassess your need for medications and monitor your lab values.  Please request your Prim.MD to go over all Hospital Tests and Procedure/Radiological results at the follow up, please get all Hospital records sent to your Prim MD by signing hospital release before you go home.  Get CBC, CMP, 2 view Chest X ray checked  by Primary MD during your next visit or SNF MD in 5-7 days ( we routinely change or add medications that can affect your baseline labs and fluid status, therefore we recommend that you get the mentioned basic workup next visit with your PCP, your PCP may decide not to get them or add new tests based on their clinical decision)  On your next visit with your primary care physician please Get Medicines reviewed and adjusted.  If you experience worsening of your admission symptoms, develop shortness of breath, life threatening emergency, suicidal or homicidal thoughts you must seek medical attention immediately by calling 911 or calling your MD immediately  if symptoms less severe.  You Must read complete instructions/literature along with all the possible adverse reactions/side effects for all the Medicines you take and that have been prescribed to you. Take any new Medicines after you have completely understood and accpet all the possible adverse reactions/side effects.   Do not drive, operate heavy machinery, perform activities at heights, swimming or participation in water activities or provide baby sitting services if your were admitted for syncope or siezures until you have seen by Primary MD or a Neurologist and advised to do so again.  Do not drive when taking Pain medications.   Procedures/Studies: DG Abd 1 View  Result Date: 07/29/2023 CLINICAL DATA:  Abdominal distension EXAM: ABDOMEN - 1 VIEW COMPARISON:  07/26/2023 FINDINGS: Nasogastric tube noted with tip in the stomach. Jackson-Pratt type drain  projects over the right abdomen. Skin clips noted over the left abdomen. Scattered gas in nondilated bowel with some formed stool in the right colon. IMPRESSION: 1. Nonobstructive bowel gas pattern. 2. Nasogastric tube tip in the stomach. JP drain along the right abdomen. Electronically Signed   By: Gaylyn Rong M.D.   On: 07/29/2023 10:19   DG Chest Port 1 View  Result Date: 07/27/2023 CLINICAL DATA:  NG tube placement, ileus. EXAM: PORTABLE CHEST 1 VIEW COMPARISON:  07/26/2023. FINDINGS: The heart size and mediastinal contours are  stable. Lung volumes are low. Patchy airspace disease is present in the right upper lobe. Blunting of the right costophrenic angle is noted. No pneumothorax is seen. An enteric tube terminates in the stomach and appears appropriate in position. Sternotomy wires are noted over the midline. IMPRESSION: 1. Patchy airspace disease in the right upper lobe, concerning for developing pneumonia. 2. Blunting of the right costophrenic angle, possible atelectasis, scarring, or effusion. 3. NG tube terminates in the stomach and appears appropriate in position. Electronically Signed   By: Thornell Sartorius M.D.   On: 07/27/2023 20:19   CT ABDOMEN PELVIS W CONTRAST  Result Date: 07/26/2023 CLINICAL DATA:  Epigastric pain. EXAM: CT ABDOMEN AND PELVIS WITH CONTRAST TECHNIQUE: Multidetector CT imaging of the abdomen and pelvis was performed using the standard protocol following bolus administration of intravenous contrast. RADIATION DOSE REDUCTION: This exam was performed according to the departmental dose-optimization program which includes automated exposure control, adjustment of the mA and/or kV according to patient size and/or use of iterative reconstruction technique. CONTRAST:  OMNIPAQUE IOHEXOL 300 MG/ML  SOLN COMPARISON:  None Available. FINDINGS: Lower chest: There are bibasilar linear atelectasis/scarring. The visualized lung bases are otherwise clear. There is coronary  vascular calcification. No intra-abdominal free air or free fluid. Hepatobiliary: Apparent mild fatty liver. No biliary dilatation. The gallbladder is distended. No calcified gallstone. There is however inflammatory changes of the gallbladder wall and stranding of the pericholecystic fat most consistent with acute cholecystitis. Further evaluation with ultrasound recommended. Pancreas: Moderate pancreatic atrophy. No active inflammatory changes. No dilatation of the main pancreatic duct. Spleen: Normal in size without focal abnormality. Adrenals/Urinary Tract: The adrenal glands unremarkable. There is no hydronephrosis on either side. There is symmetric enhancement and excretion of contrast by both kidneys. The visualized ureters and urinary bladder appear unremarkable. Stomach/Bowel: There is no bowel obstruction or active inflammation. There is mild distension of the stomach. The appendix is normal. Vascular/Lymphatic: The abdominal aorta and IVC are unremarkable. No portal venous gas. There is no adenopathy. Reproductive: The prostate and seminal vesicles are grossly unremarkable. No pelvic mass. Other: None Musculoskeletal: No acute or significant osseous findings. IMPRESSION: 1. Findings most consistent with acute cholecystitis. Further evaluation with ultrasound recommended. 2. No bowel obstruction. Normal appendix. Electronically Signed   By: Elgie Collard M.D.   On: 07/26/2023 19:44   DG Chest Port 1 View  Result Date: 07/26/2023 CLINICAL DATA:  Chest pain EXAM: PORTABLE CHEST 1 VIEW COMPARISON:  Chest x-ray 03/01/2022 FINDINGS: Sternotomy wires are present. The heart size and mediastinal contours are within normal limits. Both lungs are clear. The visualized skeletal structures are unremarkable. IMPRESSION: No active disease. Electronically Signed   By: Darliss Cheney M.D.   On: 07/26/2023 03:58     The results of significant diagnostics from this hospitalization (including imaging, microbiology,  ancillary and laboratory) are listed below for reference.     Microbiology: No results found for this or any previous visit (from the past 240 hour(s)).   Labs: BNP (last 3 results) No results for input(s): "BNP" in the last 8760 hours. Basic Metabolic Panel: Recent Labs  Lab 07/27/23 0411 07/28/23 0447 07/29/23 0440 07/30/23 0508 07/31/23 0457  NA 130* 132* 137 136 134*  K 3.7 4.1 3.4* 3.4* 3.7  CL 95* 96* 98 100 99  CO2 24 26 30 26 25   GLUCOSE 215* 264* 145* 109* 206*  BUN 16 21 28* 22 15  CREATININE 0.63 0.87 0.83 0.74 0.80  CALCIUM 8.6* 8.1*  8.0* 7.8* 8.0*  MG 1.9 2.2 2.3 2.2 2.0   Liver Function Tests: Recent Labs  Lab 07/27/23 0411 07/28/23 0447 07/29/23 0440 07/30/23 0508 07/31/23 0457  AST 23 131* 74* 192* 107*  ALT 22 93* 83* 166* 160*  ALKPHOS 104 123 138* 241* 268*  BILITOT 1.2 0.7 0.4 0.4 0.5  PROT 6.9 5.8* 5.5* 5.5* 5.4*  ALBUMIN 3.2* 2.3* 2.1* 2.2* 2.2*   Recent Labs  Lab 07/26/23 0315 07/26/23 1802  LIPASE 17 17   No results for input(s): "AMMONIA" in the last 168 hours. CBC: Recent Labs  Lab 07/26/23 1802 07/27/23 0411 07/28/23 0447 07/29/23 0440 07/30/23 0508 07/31/23 0457  WBC 18.2* 22.5* 12.4* 10.1 9.4 8.9  NEUTROABS 15.9* 19.3*  --   --   --   --   HGB 14.5 14.3 12.9* 12.1* 12.4* 12.1*  HCT 42.8 42.9 40.1 37.2* 39.1 38.3*  MCV 85.9 86.7 87.9 89.4 89.3 88.9  PLT 265 269 270 276 279 308   Cardiac Enzymes: No results for input(s): "CKTOTAL", "CKMB", "CKMBINDEX", "TROPONINI" in the last 168 hours. BNP: Invalid input(s): "POCBNP" CBG: Recent Labs  Lab 07/30/23 0742 07/30/23 1117 07/30/23 1633 07/30/23 2022 07/31/23 0724  GLUCAP 84 98 157* 215* 187*   D-Dimer No results for input(s): "DDIMER" in the last 72 hours. Hgb A1c No results for input(s): "HGBA1C" in the last 72 hours. Lipid Profile No results for input(s): "CHOL", "HDL", "LDLCALC", "TRIG", "CHOLHDL", "LDLDIRECT" in the last 72 hours. Thyroid function  studies No results for input(s): "TSH", "T4TOTAL", "T3FREE", "THYROIDAB" in the last 72 hours.  Invalid input(s): "FREET3" Anemia work up No results for input(s): "VITAMINB12", "FOLATE", "FERRITIN", "TIBC", "IRON", "RETICCTPCT" in the last 72 hours. Urinalysis    Component Value Date/Time   COLORURINE YELLOW 07/26/2023 1736   APPEARANCEUR CLEAR 07/26/2023 1736   LABSPEC 1.039 (H) 07/26/2023 1736   PHURINE 5.0 07/26/2023 1736   GLUCOSEU >=500 (A) 07/26/2023 1736   GLUCOSEU NEGATIVE 01/28/2013 1243   HGBUR NEGATIVE 07/26/2023 1736   BILIRUBINUR NEGATIVE 07/26/2023 1736   BILIRUBINUR negative 07/25/2023 1854   KETONESUR 80 (A) 07/26/2023 1736   PROTEINUR 100 (A) 07/26/2023 1736   UROBILINOGEN 0.2 07/25/2023 1854   UROBILINOGEN 0.2 01/28/2013 1243   NITRITE NEGATIVE 07/26/2023 1736   LEUKOCYTESUR NEGATIVE 07/26/2023 1736   Sepsis Labs Recent Labs  Lab 07/28/23 0447 07/29/23 0440 07/30/23 0508 07/31/23 0457  WBC 12.4* 10.1 9.4 8.9   Microbiology No results found for this or any previous visit (from the past 240 hour(s)).   Time coordinating discharge:  I have spent 35 minutes face to face with the patient and on the ward discussing the patients care, assessment, plan and disposition with other care givers. >50% of the time was devoted counseling the patient about the risks and benefits of treatment/Discharge disposition and coordinating care.   SIGNED:   Miguel Rota, MD  [facility] 07/31/2023, 10:40 AM   If 7PM-7AM, please contact night-coverage

## 2023-07-31 NOTE — Consult Note (Signed)
[facility] Customer service manager [facility]) Accountable Care Organization (ACO) [facility] Liaison Note  07/31/2023  MATHIAS CICERO [birth year] [id number]  Location: [facility] RN Hospital Liaison screened the patient remotely at [facility].  Insurance: [employer] Advantage   Jacob Acosta is a 62 y.o. male who is a Primary Care Patient of Margo Aye, Kathleene Hazel, MD. The patient was screened for readmission hospitalization with noted low risk score for unplanned readmission risk with 1 IP/1 ED in 6 months.  The patient was assessed for potential [facility]) Care Management service needs for post hospital transition for care coordination. Review of patient's electronic medical record reveals patient was admitted Acute gangrenous cholecystitis. No anticipated needs at this time.   Plan: [facility] Liaison will continue to follow progress and disposition to asess for post hospital community care coordination/management needs.  Referral request for community care coordination: anticipate [facility] Transitions of Care Team follow up.  [facility] Care Management/Population Health does not replace or interfere with any arrangements made by the Inpatient Transition of Care team.   For questions contact:   Elliot Cousin, RN, [facility] Liaison [place]   Population Health Office Hours MTWF  8:00 am-6:00 pm [phone] mobile [phone] [Office toll free line] Office Hours are M-F 8:30 - 5 pm [email or web address]

## 2023-08-09 ENCOUNTER — Ambulatory Visit (HOSPITAL_COMMUNITY)
Admission: RE | Admit: 2023-08-09 | Discharge: 2023-08-09 | Disposition: A | Discharge status: Home / Self Care | Payer: 59 | Source: Ambulatory Visit | Attending: General Surgery | Admitting: General Surgery

## 2023-08-09 ENCOUNTER — Encounter (HOSPITAL_COMMUNITY): Payer: Self-pay

## 2023-08-09 DIAGNOSIS — Z9049 Acquired absence of other specified parts of digestive tract: Secondary | ICD-10-CM | POA: Diagnosis not present

## 2023-08-09 MED ORDER — TECHNETIUM TC 99M MEBROFENIN IV KIT
5.0000 | PACK | Freq: Once | INTRAVENOUS | Status: AC | PRN
  Administered 2023-08-09: 5.3 via INTRAVENOUS

## 2023-08-09 NOTE — Progress Notes (Signed)
No leak, will remove drain tomorrow.

## 2023-08-10 ENCOUNTER — Encounter: Payer: Self-pay | Admitting: General Surgery

## 2023-08-10 ENCOUNTER — Ambulatory Visit (INDEPENDENT_AMBULATORY_CARE_PROVIDER_SITE_OTHER): Payer: 59 | Admitting: General Surgery

## 2023-08-10 VITALS — BP 104/69 | HR 67 | Temp 97.0°F | Resp 16 | Ht 67.0 in | Wt 213.0 lb

## 2023-08-10 DIAGNOSIS — K810 Acute cholecystitis: Secondary | ICD-10-CM

## 2023-08-10 DIAGNOSIS — R7989 Other specified abnormal findings of blood chemistry: Secondary | ICD-10-CM

## 2023-08-10 NOTE — Progress Notes (Signed)
[facility]  Doing well. HIDA negative. He finished antibiotics.   BP 104/69   Pulse 67   Temp (!) 97 F (36.1 C) (Oral)   Resp 16   Ht 5\' 7"  (1.702 m)   Wt 213 lb (96.6 kg)   SpO2 96%   BMI 33.36 kg/m  JP with serous output. Staples c/d/I with no erythema or drainage JP removed, dressing placed Steri strips placed and staples d/c  Patient s/p robotic assisted lap cholecystectomy for acute gangrenous cholecystitis.  He has finished antibiotics JP removed with HIDA negative Diet and activity as tolerated Steristrips will peel off in the next 5-7 days. You can remove them once they are peeling off. It is ok to shower. Pat the area dry.   Algis Greenhouse, MD [facility] [address] [place], [place] [address] [phone] (office)

## 2023-08-10 NOTE — Patient Instructions (Signed)
Activity and diet as tolerated Steristrips will peel off in the next 5-7 days. You can remove them once they are peeling off. It is ok to shower. Pat the area dry.  Leave bandage on for 48 hours. Ok to shower with it on.

## 2023-08-14 DIAGNOSIS — R7989 Other specified abnormal findings of blood chemistry: Secondary | ICD-10-CM | POA: Diagnosis not present

## 2023-08-15 HISTORY — PX: CHOLECYSTECTOMY: SHX55

## 2023-08-15 LAB — COMPREHENSIVE METABOLIC PANEL
AG Ratio: 1.3 (calc) (ref 1.0–2.5)
ALT: 74 U/L — ABNORMAL HIGH (ref 9–46)
AST: 45 U/L — ABNORMAL HIGH (ref 10–35)
Albumin: 3.4 g/dL — ABNORMAL LOW (ref 3.6–5.1)
Alkaline phosphatase (APISO): 156 U/L — ABNORMAL HIGH (ref 35–144)
BUN: 15 mg/dL (ref 7–25)
CO2: 27 mmol/L (ref 20–32)
Calcium: 8.8 mg/dL (ref 8.6–10.3)
Chloride: 105 mmol/L (ref 98–110)
Creat: 0.8 mg/dL (ref 0.70–1.35)
Globulin: 2.6 g/dL (ref 1.9–3.7)
Glucose, Bld: 196 mg/dL — ABNORMAL HIGH (ref 65–99)
Potassium: 4.2 mmol/L (ref 3.5–5.3)
Sodium: 139 mmol/L (ref 135–146)
Total Bilirubin: 0.3 mg/dL (ref 0.2–1.2)
Total Protein: 6 g/dL — ABNORMAL LOW (ref 6.1–8.1)

## 2023-08-15 NOTE — Progress Notes (Signed)
Labs heading in right direction but still not 100% back to normal. Should get repeat labs in 6 weeks or so, we can order those of his PCP.

## 2023-09-04 ENCOUNTER — Ambulatory Visit
Admission: EM | Admit: 2023-09-04 | Discharge: 2023-09-04 | Disposition: A | Discharge status: Home / Self Care | Payer: 59 | Attending: Nurse Practitioner | Admitting: Nurse Practitioner

## 2023-09-04 DIAGNOSIS — R059 Cough, unspecified: Secondary | ICD-10-CM

## 2023-09-04 DIAGNOSIS — B029 Zoster without complications: Secondary | ICD-10-CM | POA: Diagnosis not present

## 2023-09-04 DIAGNOSIS — R21 Rash and other nonspecific skin eruption: Secondary | ICD-10-CM | POA: Diagnosis not present

## 2023-09-04 MED ORDER — PREDNISONE 20 MG PO TABS: 40.0000 mg | ORAL_TABLET | Freq: Every day | ORAL | 0 refills | Status: AC

## 2023-09-04 MED ORDER — GABAPENTIN 100 MG PO CAPS: 100.0000 mg | ORAL_CAPSULE | Freq: Three times a day (TID) | ORAL | 0 refills | Status: AC

## 2023-09-04 MED ORDER — VALACYCLOVIR HCL 1 G PO TABS: 1000.0000 mg | ORAL_TABLET | Freq: Three times a day (TID) | ORAL | 0 refills | Status: AC

## 2023-09-04 MED ORDER — ALBUTEROL SULFATE HFA 108 (90 BASE) MCG/ACT IN AERS: 2.0000 | INHALATION_SPRAY | Freq: Four times a day (QID) | RESPIRATORY_TRACT | 0 refills | Status: AC | PRN

## 2023-09-04 MED ORDER — MUPIROCIN 2 % EX OINT: 1.0000 | TOPICAL_OINTMENT | Freq: Two times a day (BID) | CUTANEOUS | 0 refills | Status: AC

## 2023-09-04 NOTE — ED Triage Notes (Signed)
Pt states he has bumps/possible insect bite to back of his left thigh. Pt states he was mowing the grass then noticed these bumps that started 5 days ago. Pt states there are painful tot the touch.

## 2023-09-04 NOTE — ED Provider Notes (Signed)
[facility]    CSN: [id number] Arrival date & time: 09/04/23  0808      History   Chief Complaint Chief Complaint  Patient presents with   Insect Bite    HPI Jacob Acosta is a 62 y.o. male.   The history is provided by the patient.   Patient presents for complaints of a possible ankle insect bite to the left lower leg, he also complains of a "sore" on his left buttock.  Patient states that symptoms started approximately 1 week ago.  He states that the area to his left lower leg is painful to touch.  He denies fever, chills, foul-smelling drainage from the site, exposure to new soaps, medications, lotions, foods, or detergents.  He states that his symptoms started after he was mowing the grass.  He also complains of a persistent cough.  Patient has had cough for quite some time.  At his last visit in this clinic, he complained of the same or similar symptoms.  Patient denies fever, chills, chest pain, shortness of breath, difficulty breathing, abdominal pain, nausea, vomiting, diarrhea, or other upper respiratory symptoms.  Patient states he does have intermittent wheezing with the cough.  States that he does have an inhaler, but has been out of it for quite some time.  Past Medical History:  Diagnosis Date   Anxiety    Diabetes mellitus without complication (HCC)    High cholesterol     Patient Active Problem List   Diagnosis Date Noted   Acute cholecystitis 07/28/2023   Anxiety 07/27/2023   GERD (gastroesophageal reflux disease) 07/27/2023   Ileus (HCC) 07/27/2023   Acute gangrenous cholecystitis 07/26/2023   Severe obesity (BMI 35.0-39.9) with comorbidity (HCC) 03/16/2022   Type II diabetes mellitus with complication (HCC) 03/16/2022   Hypercholesterolemia 03/16/2022   Coronary artery disease 01/19/2022   NSTEMI (non-ST elevated myocardial infarction) (HCC) 01/17/2022   ACS (acute coronary syndrome) (HCC) 01/16/2022   Personal history of noncompliance  with medical treatment, presenting hazards to health 12/13/2017   Uncontrolled type 2 diabetes mellitus with hyperglycemia (HCC) 08/30/2017   Mixed hyperlipidemia 08/30/2017   Physical exam, annual 01/28/2013   Overweight 01/28/2013   History of nephrolithiasis 01/28/2013   Tendonitis of ankle or foot 01/28/2013    Past Surgical History:  Procedure Laterality Date   CORONARY ARTERY BYPASS GRAFT N/A 01/19/2022   Procedure: CORONARY ARTERY BYPASS GRAFTING (CABG), ON PUMP, TIMES THREE USING LEFT INTERNAL MAMMARY ARTERY AND RIGHT ENDOSCOPICALLY HARVESTED GREATER SAPHENOUS VEIN;  Surgeon: Corliss Skains, MD;  Location: [facility] OR;  Service: Open Heart Surgery;  Laterality: N/A;  left radial artery harvest   dental sugery     at age 63   LEFT HEART CATH AND CORONARY ANGIOGRAPHY N/A 01/17/2022   Procedure: LEFT HEART CATH AND CORONARY ANGIOGRAPHY;  Surgeon: Yvonne Kendall, MD;  Location: [facility];  Service: Cardiovascular;  Laterality: N/A;   RADIAL ARTERY HARVEST Left 01/19/2022   Procedure: RADIAL ARTERY HARVEST;  Surgeon: Corliss Skains, MD;  Location: [facility] OR;  Service: Open Heart Surgery;  Laterality: Left;   TEE WITHOUT CARDIOVERSION N/A 01/19/2022   Procedure: TRANSESOPHAGEAL ECHOCARDIOGRAM (TEE);  Surgeon: Corliss Skains, MD;  Location: [facility] OR;  Service: Open Heart Surgery;  Laterality: N/A;   TONSILLECTOMY  1980       Home Medications    Prior to Admission medications   Medication Sig Start Date End Date Taking? Authorizing Provider  aspirin 81 MG tablet Take  1 tablet (81 mg total) by mouth daily. 01/23/22  Yes Barrett, Erin R, PA-C  ezetimibe (ZETIA) 10 MG tablet TAKE 1 TABLET BY MOUTH EVERY DAY 07/11/23  Yes Croitoru, Mihai, MD  gabapentin (NEURONTIN) 100 MG capsule Take 1 capsule (100 mg total) by mouth 3 (three) times daily for 7 days. 09/04/23 09/11/23 Yes Leath-Warren, Sadie Haber, NP  Insulin Glargine (BASAGLAR KWIKPEN) 100 UNIT/ML Inject 30-34 Units into the skin  in the morning and at bedtime.   Yes [provider]  metoprolol tartrate (LOPRESSOR) 25 MG tablet TAKE 1 TABLET BY MOUTH TWICE A DAY 07/11/23  Yes Croitoru, Mihai, MD  Multiple Vitamins-Minerals (CENTRUM SILVER 50+MEN PO) Take 1 tablet by mouth daily.   Yes [provider]  mupirocin ointment (BACTROBAN) 2 % Apply 1 Application topically 2 (two) times daily. 09/04/23  Yes Leath-Warren, Sadie Haber, NP  predniSONE (DELTASONE) 20 MG tablet Take 2 tablets (40 mg total) by mouth daily with breakfast for 5 days. 09/04/23 09/09/23 Yes Leath-Warren, Sadie Haber, NP  rosuvastatin (CRESTOR) 40 MG tablet TAKE 1 TABLET BY MOUTH EVERY DAY 05/15/23  Yes Croitoru, Mihai, MD  valACYclovir (VALTREX) 1000 MG tablet Take 1 tablet (1,000 mg total) by mouth 3 (three) times daily. 09/04/23  Yes Leath-Warren, Sadie Haber, NP  vitamin C (ASCORBIC ACID) 500 MG tablet Take 1,000 mg by mouth daily.   Yes [provider]  acetaminophen (TYLENOL) 325 MG tablet Take 650 mg by mouth every 6 (six) hours as needed for mild pain.    [provider]  docusate sodium (COLACE) 100 MG capsule Take 1 capsule (100 mg total) by mouth 2 (two) times daily as needed for mild constipation. 07/31/23   Amin, Ankit C, MD  ondansetron (ZOFRAN) 4 MG tablet Take 1 tablet (4 mg total) by mouth every 8 (eight) hours as needed for nausea or vomiting. 12/03/22   Avegno, Zachery Dakins, FNP  oxyCODONE (ROXICODONE) 5 MG immediate release tablet Take 1 tablet (5 mg total) by mouth every 4 (four) hours as needed for breakthrough pain. 07/31/23 07/30/24  Amin, Ankit C, MD  pantoprazole (PROTONIX) 40 MG tablet Take 1 tablet (40 mg total) by mouth daily. 07/25/23 08/24/23  Leath-Warren, Sadie Haber, NP  sucralfate (CARAFATE) 1 g tablet Take 1 tablet (1 g total) by mouth 4 (four) times daily as needed. 07/26/23   Sabas Sous, MD    Family History Family History  Problem Relation Age of Onset   Anxiety disorder Mother    Depression  Father    Alcohol abuse Father     Social History Social History   Tobacco Use   Smoking status: Former    Types: Cigarettes   Smokeless tobacco: Never   Tobacco comments:    only smoked occasionally  Vaping Use   Vaping status: Never Used  Substance Use Topics   Alcohol use: No   Drug use: No     Allergies   Ciprofloxacin hcl, Codeine, Metformin and related, and Toujeo max solostar [insulin glargine]   Review of Systems Review of Systems Per HPI  Physical Exam Triage Vital Signs ED Triage Vitals  Encounter Vitals Group     BP 09/04/23 0824 114/74     Systolic BP Percentile --      Diastolic BP Percentile --      Pulse Rate 09/04/23 0824 69     Resp 09/04/23 0824 16     Temp 09/04/23 0824 98 F (36.7 C)     Temp  Source 09/04/23 0824 Oral     SpO2 09/04/23 0824 96 %     Weight --      Height --      Head Circumference --      Peak Flow --      Pain Score 09/04/23 0829 3     Pain Loc --      Pain Education --      Exclude from Growth Chart --    No data found.  Updated Vital Signs BP 114/74 (BP Location: Left Arm)   Pulse 69   Temp 98 F (36.7 C) (Oral)   Resp 16   SpO2 96%   Visual Acuity Right Eye Distance:   Left Eye Distance:   Bilateral Distance:    Right Eye Near:   Left Eye Near:    Bilateral Near:     Physical Exam Vitals and nursing note reviewed.  Constitutional:      General: He is not in acute distress.    Appearance: Normal appearance.  HENT:     Head: Normocephalic.  Eyes:     Extraocular Movements: Extraocular movements intact.     Pupils: Pupils are equal, round, and reactive to light.  Cardiovascular:     Rate and Rhythm: Normal rate and regular rhythm.     Pulses: Normal pulses.     Heart sounds: Normal heart sounds.  Pulmonary:     Effort: Pulmonary effort is normal. No respiratory distress.     Breath sounds: Normal breath sounds. No stridor. No wheezing, rhonchi or rales.  Abdominal:     General: Bowel sounds  are normal.     Palpations: Abdomen is soft.     Tenderness: There is no abdominal tenderness.  Musculoskeletal:     Cervical back: Normal range of motion.  Lymphadenopathy:     Cervical: No cervical adenopathy.  Skin:    General: Skin is warm and dry.     Findings: Rash present. Rash is vesicular.       Neurological:     General: No focal deficit present.     Mental Status: He is alert and oriented to person, place, and time.  Psychiatric:        Mood and Affect: Mood normal.        Behavior: Behavior normal.      UC Treatments / Results  Labs (all labs ordered are listed, but only abnormal results are displayed) Labs Reviewed - No data to display  EKG   Radiology No results found.  Procedures Procedures (including critical care time)  Medications Ordered in UC Medications - No data to display  Initial Impression / Assessment and Plan / UC Course  I have reviewed the triage vital signs and the nursing notes.  Pertinent labs & imaging results that were available during my care of the patient were reviewed by me and considered in my medical decision making (see chart for details).  Suspect herpes zoster of the left lower extremity.  Given that symptoms have been present for more than 1 week, will provide valacyclovir 1000 mg for shingles despite duration of symptoms.  Gabapentin 100 mg for pain, and prednisone 40 mg for inflammation.  An albuterol inhaler was prescribed for the patient's persistent cough.  He does not have any wheezing or abnormal breath sounds noted on his exam, room air sats at 96%.  With regard to the area on his buttock, mupirocin 2% ointment prescribed.  Supportive care recommendations were provided and discussed  with the patient to include cool compresses to the affected areas, over-the-counter analgesics such as Tylenol, and covering the areas if they begin to drain, and avoidance of rubbing or disrupting the areas.  With regard to his cough, patient  was advised to follow-up with his primary care physician if symptoms continue to persist.  Patient is in agreement with this plan of care and verbalizes understanding.  All questions were answered.  Patient stable for discharge.  Final Clinical Impressions(s) / UC Diagnoses   Final diagnoses:  Herpes zoster without complication  Rash and nonspecific skin eruption  Cough, unspecified type     Discharge Instructions      Take medication as prescribed. You may take over-the-counter Tylenol as needed for pain, fever, or general discomfort. For the rash, apply cool compresses to the area to help with pain, itching, or discomfort.  Do not pick or disrupt the areas. Keep the areas clean and dry. If you develop a worsening cough with persistent wheezing, or if you develop shortness of breath or difficulty breathing, please go to the emergency department immediately for further evaluation.  I would like for you to follow-up with your primary care physician for further evaluation of the cough. Follow-up as needed.     ED Prescriptions     Medication Sig Dispense Auth. Provider   valACYclovir (VALTREX) 1000 MG tablet Take 1 tablet (1,000 mg total) by mouth 3 (three) times daily. 21 tablet Leath-Warren, Sadie Haber, NP   gabapentin (NEURONTIN) 100 MG capsule Take 1 capsule (100 mg total) by mouth 3 (three) times daily for 7 days. 21 capsule Leath-Warren, Sadie Haber, NP   predniSONE (DELTASONE) 20 MG tablet Take 2 tablets (40 mg total) by mouth daily with breakfast for 5 days. 10 tablet Leath-Warren, Sadie Haber, NP   mupirocin ointment (BACTROBAN) 2 % Apply 1 Application topically 2 (two) times daily. 30 g Leath-Warren, Sadie Haber, NP      PDMP not reviewed this encounter.   Abran Cantor, NP 09/04/23 [phone]

## 2023-09-04 NOTE — Discharge Instructions (Signed)
Take medication as prescribed. You may take over-the-counter Tylenol as needed for pain, fever, or general discomfort. For the rash, apply cool compresses to the area to help with pain, itching, or discomfort.  Do not pick or disrupt the areas. Keep the areas clean and dry. If you develop a worsening cough with persistent wheezing, or if you develop shortness of breath or difficulty breathing, please go to the emergency department immediately for further evaluation.  I would like for you to follow-up with your primary care physician for further evaluation of the cough. Follow-up as needed.

## 2023-09-13 ENCOUNTER — Encounter: Payer: Self-pay | Admitting: Cardiovascular Disease

## 2023-09-13 ENCOUNTER — Ambulatory Visit: Payer: 59 | Attending: Cardiovascular Disease | Admitting: Cardiovascular Disease

## 2023-09-13 VITALS — BP 104/60 | HR 57 | Ht 67.0 in | Wt 226.0 lb

## 2023-09-13 DIAGNOSIS — I2510 Atherosclerotic heart disease of native coronary artery without angina pectoris: Secondary | ICD-10-CM

## 2023-09-13 DIAGNOSIS — E7800 Pure hypercholesterolemia, unspecified: Secondary | ICD-10-CM

## 2023-09-13 DIAGNOSIS — E1165 Type 2 diabetes mellitus with hyperglycemia: Secondary | ICD-10-CM | POA: Diagnosis not present

## 2023-09-13 NOTE — Patient Instructions (Signed)

## 2023-09-13 NOTE — Progress Notes (Signed)
Cardiology Office Note:    Date:  09/13/2023   ID:  Jacob Acosta, DOB Jul 13, 1961, MRN [id number]  PCP:  Lupita Raider, NP  Cardiologist:  Thurmon Fair, MD    Referring MD: Benita Stabile, MD   No chief complaint on file.   History of Present Illness:    Jacob Acosta is a 62 y.o. male with a hx of CAD s/p CABG (LIMA-LAD, radial-OM1, SVG-R PDA, Dr. Cliffton Asters 01/19/2022) after NSTEMI with 90% distal left main stenosis by cath 01/17/2022, hypercholesterolemia, poorly controlled type 2 diabetes mellitus, mild obesity, generalized anxiety disorder.  Jacob Acosta has had a challenging few months.  He had acute cholecystitis in regard cholecystectomy in September, then he had herpes zoster of the left leg, which is now healing quickly.  He is not having any neuralgia.  He tried taking valacyclovir and gabapentin but had psychological reactions that made him stop both.  He has no cardiovascular complaints.  He described his previous angina pectoris as a sensation of 2 people playing tug-of-war inside his chest, followed by intense burning.  Has had no recurrence of this since his bypass surgery.  He specifically denies any chest pain at rest or with exertion, dyspnea at rest or with exertion, orthopnea, paroxysmal nocturnal dyspnea, syncope, palpitations, focal neurological deficits, intermittent claudication, lower extremity edema, unexplained weight gain, cough, hemoptysis or wheezing.  He is eating a healthy diet with lots of chicken and salmon.  He has had some problems with wide fluctuations in his blood sugar.  Although his hemoglobin A1c is elevated at 10.2%, he has relatively frequent symptomatic episodes of hypoglycemia.  He is taking maximum dose rosuvastatin without side effects, plus ezetimibe (untreated LDL was 139, most recently 63).  He is on a low-dose of metoprolol and and his blood pressure is on the low end of normal.  He is on chronic treatment with aspirin, no longer on clopidogrel.     Past Medical History:  Diagnosis Date   Anxiety    Diabetes mellitus without complication (HCC)    High cholesterol     Past Surgical History:  Procedure Laterality Date   CORONARY ARTERY BYPASS GRAFT N/A 01/19/2022   Procedure: CORONARY ARTERY BYPASS GRAFTING (CABG), ON PUMP, TIMES THREE USING LEFT INTERNAL MAMMARY ARTERY AND RIGHT ENDOSCOPICALLY HARVESTED GREATER SAPHENOUS VEIN;  Surgeon: Corliss Skains, MD;  Location: [facility] OR;  Service: Open Heart Surgery;  Laterality: N/A;  left radial artery harvest   dental sugery     at age 33   LEFT HEART CATH AND CORONARY ANGIOGRAPHY N/A 01/17/2022   Procedure: LEFT HEART CATH AND CORONARY ANGIOGRAPHY;  Surgeon: Yvonne Kendall, MD;  Location: [facility];  Service: Cardiovascular;  Laterality: N/A;   RADIAL ARTERY HARVEST Left 01/19/2022   Procedure: RADIAL ARTERY HARVEST;  Surgeon: Corliss Skains, MD;  Location: [facility] OR;  Service: Open Heart Surgery;  Laterality: Left;   TEE WITHOUT CARDIOVERSION N/A 01/19/2022   Procedure: TRANSESOPHAGEAL ECHOCARDIOGRAM (TEE);  Surgeon: Corliss Skains, MD;  Location: [facility];  Service: Open Heart Surgery;  Laterality: N/A;   TONSILLECTOMY  1980    Current Medications: Current Meds  Medication Sig   aspirin 81 MG tablet Take 1 tablet (81 mg total) by mouth daily.   ezetimibe (ZETIA) 10 MG tablet TAKE 1 TABLET BY MOUTH EVERY DAY   Insulin Glargine (BASAGLAR KWIKPEN) 100 UNIT/ML Inject 30-34 Units into the skin in the morning and at bedtime.   metoprolol tartrate (LOPRESSOR) 25  MG tablet TAKE 1 TABLET BY MOUTH TWICE A DAY   Multiple Vitamins-Minerals (CENTRUM SILVER 50+MEN PO) Take 1 tablet by mouth daily.   mupirocin ointment (BACTROBAN) 2 % Apply 1 Application topically 2 (two) times daily.   rosuvastatin (CRESTOR) 40 MG tablet TAKE 1 TABLET BY MOUTH EVERY DAY   vitamin C (ASCORBIC ACID) 500 MG tablet Take 1,000 mg by mouth daily.     Allergies:   Ciprofloxacin hcl, Codeine, Metformin and  related, and Toujeo max solostar [insulin glargine]   Social History   Socioeconomic History   Marital status: Married    Spouse name: Jacob Acosta   Number of children: 0   Years of education: Not on file   Highest education level: Not on file  Occupational History   Occupation: drives delivery truck  Tobacco Use   Smoking status: Former    Types: Cigarettes   Smokeless tobacco: Never   Tobacco comments:    only smoked occasionally  Vaping Use   Vaping status: Never Used  Substance and Sexual Activity   Alcohol use: No   Drug use: No   Sexual activity: Not Currently  Other Topics Concern   Not on file  Social History Narrative   Not on file   Social Determinants of Health   Financial Resource Strain: Not on file  Food Insecurity: No Food Insecurity (07/27/2023)   Hunger Vital Sign    Worried About Running Out of Food in the Last Year: Never true    Ran Out of Food in the Last Year: Never true  Transportation Needs: No Transportation Needs (07/27/2023)   PRAPARE - Administrator, Civil Service (Medical): No    Lack of Transportation (Non-Medical): No  Physical Activity: Not on file  Stress: Not on file  Social Connections: Not on file     Family History: The patient's family history includes Alcohol abuse in his father; Anxiety disorder in his mother; Depression in his father. ROS:   Please see the history of present illness.     All other systems reviewed and are negative.  EKGs/Labs/Other Studies Reviewed:    Cardiac catheterization 01/17/2022 Critical distal LMCA stenosis with 90% eccentric narrowing and TIMI-1 flow in the distal LAD (also supplied by right-to-left collaterals). Moderate-severe LCx and RCA disease with 60% mid/distal LCx and 70% proximal RCA lesions. Normal left ventricular systolic function (LVEF 55-65%) with mildly elevated filling pressure (LVEDP 20-25 mmHg).   Recommendations: Cardiac surgery consultation for CABG; transfer  to 2H for close monitoring pending surgical evaluation. Restart heparin infusion 2 hours after TR band removal. Aggressive secondary prevention.   Dominance: Right    Comparison(s): No prior Echocardiogram.  Echocardiogram 01/18/2022:   1. Left ventricular ejection fraction, by estimation, is 60 to 65%. Left  ventricular ejection fraction by 3D volume is 60 %. The left ventricle has  normal function. The left ventricle has no regional wall motion  abnormalities. There is mild left  ventricular hypertrophy. Left ventricular diastolic parameters are  consistent with Grade I diastolic dysfunction (impaired relaxation).   2. Right ventricular systolic function is normal. The right ventricular  size is normal.   3. The mitral valve is grossly normal. Trivial mitral valve  regurgitation.   4. The aortic valve is tricuspid. Aortic valve regurgitation is not  visualized.   5. Aortic dilatation noted. There is mild dilatation of the ascending  aorta, measuring 40 mm.   6. The inferior vena cava is dilated in size with >  50% respiratory  variability, suggesting right atrial pressure of 8 mmHg.   Comparison(s): No prior Echocardiogram.    EKG:  EKG is not ordered today.  ECG from 07/26/2023 shows normal sinus rhythm, normal tracing  Recent Labs: 07/31/2023: Hemoglobin 12.1; Magnesium 2.0; Platelets 308 08/14/2023: ALT 74; BUN 15; Creat 0.80; Potassium 4.2; Sodium 139  Recent Lipid Panel    Component Value Date/Time   CHOL 199 01/17/2022 0217   TRIG 63 01/17/2022 0217   HDL 47 01/17/2022 0217   CHOLHDL 4.2 01/17/2022 0217   VLDL 13 01/17/2022 0217   LDLCALC 139 (H) 01/17/2022 0217   LDLDIRECT 153.4 01/28/2013 1243   05/29/2023 cholesterol 125, HDL 47, LDL 63, triglycerides 71  32/44/0102 hemoglobin A1c 10.2%  Physical Exam:    VS:  BP 104/60 (BP Location: Left Arm, Patient Position: Sitting, Cuff Size: Normal)   Pulse (!) 57   Ht 5\' 7"  (1.702 m)   Wt 226 lb (102.5 kg)   SpO2  97%   BMI 35.40 kg/m     Wt Readings from Last 3 Encounters:  09/13/23 226 lb (102.5 kg)  08/10/23 213 lb (96.6 kg)  07/27/23 234 lb 9.1 oz (106.4 kg)      General: Alert, oriented x3, no distress, severely obese Head: no evidence of trauma, PERRL, EOMI, no exophtalmos or lid lag, no myxedema, no xanthelasma; normal ears, nose and oropharynx Neck: normal jugular venous pulsations and no hepatojugular reflux; brisk carotid pulses without delay and no carotid bruits Chest: clear to auscultation, no signs of consolidation by percussion or palpation, normal fremitus, symmetrical and full respiratory excursions Cardiovascular: normal position and quality of the apical impulse, regular rhythm, normal first and second heart sounds, no murmurs, rubs or gallops Abdomen: no tenderness or distention, no masses by palpation, no abnormal pulsatility or arterial bruits, normal bowel sounds, no hepatosplenomegaly Extremities: no clubbing, cyanosis or edema; 2+ radial, ulnar and brachial pulses bilaterally; 2+ right femoral, posterior tibial and dorsalis pedis pulses; 2+ left femoral, posterior tibial and dorsalis pedis pulses; no subclavian or femoral bruits Neurological: grossly nonfocal Psych: Normal mood and affect    ASSESSMENT:    1. Coronary artery disease involving native coronary artery of native heart without angina pectoris   2. Hypercholesterolemia   3. Uncontrolled type 2 diabetes mellitus with hyperglycemia (HCC)   4. Severe obesity (BMI 35.0-39.9) with comorbidity (HCC)       PLAN:    In order of problems listed above:  CAD: Asymptomatic.  Recovering very well following bypass surgery.  Has made a lot of healthy lifestyle changes, but unfortunately remains severely obese.  He had lost quite a bit of weight during his bout of cholecystitis, but it is coming back. HLP: Excellent LDL cholesterol level.  HDL is also acceptable.  Continue statin and ezetimibe.   DM: Ideally would  transition from insulin to metformin plus SGLT2 inhibitor and/or GLP-1 agonist.  This would alleviate his problems with hypoglycemia and would be associated with a improving long-term cardiovascular outlook.  It would also help with weight loss.   Obesity: He states that he felt really well when he was at 185 pounds.  Would target to try to get him down to 200 pounds by the end of the year.    Medication Adjustments/Labs and Tests Ordered: Current medicines are reviewed at length with the patient today.  Concerns regarding medicines are outlined above.  No orders of the defined types were placed in this encounter.  No orders  of the defined types were placed in this encounter.   Signed, Thurmon Fair, MD  09/13/2023 9:54 AM    [facility]

## 2023-09-19 ENCOUNTER — Other Ambulatory Visit: Payer: Self-pay | Admitting: *Deleted

## 2023-09-19 DIAGNOSIS — R7989 Other specified abnormal findings of blood chemistry: Secondary | ICD-10-CM

## 2023-09-22 DIAGNOSIS — R7989 Other specified abnormal findings of blood chemistry: Secondary | ICD-10-CM | POA: Diagnosis not present

## 2023-09-23 LAB — COMPREHENSIVE METABOLIC PANEL
AG Ratio: 1.7 (calc) (ref 1.0–2.5)
ALT: 34 U/L (ref 9–46)
AST: 26 U/L (ref 10–35)
Albumin: 3.8 g/dL (ref 3.6–5.1)
Alkaline phosphatase (APISO): 83 U/L (ref 35–144)
BUN: 12 mg/dL (ref 7–25)
CO2: 26 mmol/L (ref 20–32)
Calcium: 9.2 mg/dL (ref 8.6–10.3)
Chloride: 103 mmol/L (ref 98–110)
Creat: 0.83 mg/dL (ref 0.70–1.35)
Globulin: 2.3 g/dL (ref 1.9–3.7)
Glucose, Bld: 273 mg/dL — ABNORMAL HIGH (ref 65–99)
Potassium: 4.4 mmol/L (ref 3.5–5.3)
Sodium: 138 mmol/L (ref 135–146)
Total Bilirubin: 0.4 mg/dL (ref 0.2–1.2)
Total Protein: 6.1 g/dL (ref 6.1–8.1)

## 2023-09-25 NOTE — Progress Notes (Signed)
Lfts are returned to normal. Please let the patient know.

## 2023-10-01 ENCOUNTER — Other Ambulatory Visit: Payer: Self-pay | Admitting: Cardiovascular Disease

## 2023-10-05 ENCOUNTER — Other Ambulatory Visit: Payer: Self-pay | Admitting: Cardiovascular Disease

## 2023-10-17 DIAGNOSIS — E1165 Type 2 diabetes mellitus with hyperglycemia: Secondary | ICD-10-CM | POA: Diagnosis not present

## 2023-10-31 DIAGNOSIS — I10 Essential (primary) hypertension: Secondary | ICD-10-CM | POA: Diagnosis not present

## 2023-10-31 DIAGNOSIS — E782 Mixed hyperlipidemia: Secondary | ICD-10-CM | POA: Diagnosis not present

## 2023-10-31 DIAGNOSIS — I2510 Atherosclerotic heart disease of native coronary artery without angina pectoris: Secondary | ICD-10-CM | POA: Diagnosis not present

## 2023-10-31 DIAGNOSIS — I252 Old myocardial infarction: Secondary | ICD-10-CM | POA: Diagnosis not present

## 2023-10-31 DIAGNOSIS — E1165 Type 2 diabetes mellitus with hyperglycemia: Secondary | ICD-10-CM | POA: Diagnosis not present

## 2023-12-01 ENCOUNTER — Ambulatory Visit: Payer: 59 | Admitting: Cardiovascular Disease

## 2023-12-28 ENCOUNTER — Other Ambulatory Visit: Payer: Self-pay | Admitting: Cardiovascular Disease

## 2024-04-12 DIAGNOSIS — E782 Mixed hyperlipidemia: Secondary | ICD-10-CM | POA: Diagnosis not present

## 2024-04-12 DIAGNOSIS — E119 Type 2 diabetes mellitus without complications: Secondary | ICD-10-CM | POA: Diagnosis not present

## 2024-04-13 LAB — LAB REPORT - SCANNED
A1c: 10.7
Albumin, Urine POC: 14.1
Creatinine, POC: 177.6 mg/dL
EGFR: 89
Microalb Creat Ratio: 8

## 2024-04-19 ENCOUNTER — Other Ambulatory Visit: Payer: Self-pay | Admitting: *Deleted

## 2024-04-19 DIAGNOSIS — E7800 Pure hypercholesterolemia, unspecified: Secondary | ICD-10-CM

## 2024-04-19 MED ORDER — EZETIMIBE 10 MG PO TABS: 10.0000 mg | ORAL_TABLET | Freq: Every day | ORAL | 2 refills | Status: DC

## 2024-05-04 ENCOUNTER — Other Ambulatory Visit: Payer: Self-pay | Admitting: Cardiovascular Disease

## 2024-06-10 ENCOUNTER — Other Ambulatory Visit: Payer: Self-pay | Admitting: Cardiovascular Disease

## 2024-07-08 ENCOUNTER — Other Ambulatory Visit: Payer: Self-pay | Admitting: Cardiovascular Disease

## 2024-07-08 DIAGNOSIS — E7800 Pure hypercholesterolemia, unspecified: Secondary | ICD-10-CM

## 2024-07-24 ENCOUNTER — Ambulatory Visit: Attending: Cardiovascular Disease | Admitting: Cardiovascular Disease

## 2024-07-24 ENCOUNTER — Encounter: Payer: Self-pay | Admitting: Cardiovascular Disease

## 2024-07-24 VITALS — BP 108/60 | HR 62 | Ht 67.0 in | Wt 230.1 lb

## 2024-07-24 DIAGNOSIS — I25810 Atherosclerosis of coronary artery bypass graft(s) without angina pectoris: Secondary | ICD-10-CM | POA: Diagnosis not present

## 2024-07-24 DIAGNOSIS — E1165 Type 2 diabetes mellitus with hyperglycemia: Secondary | ICD-10-CM | POA: Diagnosis not present

## 2024-07-24 DIAGNOSIS — E7800 Pure hypercholesterolemia, unspecified: Secondary | ICD-10-CM

## 2024-07-24 NOTE — Progress Notes (Signed)
 Cardiology Office Note:    Date:  07/24/2024   ID:  Jacob Acosta, DOB 1961/09/20, MRN [id number]  PCP:  Jacob Honey, NP  Cardiologist:  Jerel Balding, MD    Referring MD: Jacob Honey, NP   Chief Complaint  Patient presents with   Coronary Artery Disease    History of Present Illness:    Jacob Acosta is a 63 y.o. male with a hx of CAD s/p CABG (LIMA-LAD, radial-OM1, SVG-R PDA, Dr. Shyrl 01/19/2022) after NSTEMI with 90% distal left main stenosis by cath 01/17/2022, hypercholesterolemia, poorly controlled type 2 diabetes mellitus, mild obesity, generalized anxiety disorder.  He is accompanied by his wife Theoplis.  From a cardiac point of view [name] is doing quite well.  He has no complaints of shortness of breath or chest pain either at rest or with activity.  Has not had palpitations, dizziness, syncope, leg edema, claudication or any focal neurological complaints.  His blood pressure is well-controlled and his lipid profile is excellent with an LDL cholesterol of 50, albeit with a borderline low HDL at 42.  The remaining risk factor that is not well addressed is poor glycemic control with a hemoglobin A1c that is 10.7%, on chronic insulin  therapy.  He was given samples of Farxiga but has not started them since he was concerned about possible side effects.  He has also heard bad things about possible side effects of medicines like Ozempic and expresses reluctance to use those.  There is a plan to probably start a sliding scale insulin  with meals with his primary care provider.  He described his previous angina pectoris as a sensation of 2 people playing tug-of-war inside his chest, followed by intense burning.  Has had no recurrence of this since his bypass surgery.    The patient specifically denies any chest pain at rest or with exertion, dyspnea at rest or with exertion, orthopnea, paroxysmal nocturnal dyspnea, syncope, palpitations, focal neurological deficits, intermittent  claudication, lower extremity edema, unexplained weight gain, cough, hemoptysis or wheezing.   Past Medical History:  Diagnosis Date   Anxiety    Diabetes mellitus without complication (HCC)    High cholesterol     Past Surgical History:  Procedure Laterality Date   CORONARY ARTERY BYPASS GRAFT N/A 01/19/2022   Procedure: CORONARY ARTERY BYPASS GRAFTING (CABG), ON PUMP, TIMES THREE USING LEFT INTERNAL MAMMARY ARTERY AND RIGHT ENDOSCOPICALLY HARVESTED GREATER SAPHENOUS VEIN;  Surgeon: Jacob Linnie KIDD, MD;  Location: [facility] OR;  Service: Open Heart Surgery;  Laterality: N/A;  left radial artery harvest   dental sugery     at age 28   LEFT HEART CATH AND CORONARY ANGIOGRAPHY N/A 01/17/2022   Procedure: LEFT HEART CATH AND CORONARY ANGIOGRAPHY;  Surgeon: Jacob Bruckner, MD;  Location: [facility] INVASIVE CV LAB;  Service: Cardiovascular;  Laterality: N/A;   RADIAL ARTERY HARVEST Left 01/19/2022   Procedure: RADIAL ARTERY HARVEST;  Surgeon: Jacob Linnie KIDD, MD;  Location: [facility] OR;  Service: Open Heart Surgery;  Laterality: Left;   TEE WITHOUT CARDIOVERSION N/A 01/19/2022   Procedure: TRANSESOPHAGEAL ECHOCARDIOGRAM (TEE);  Surgeon: Jacob Linnie KIDD, MD;  Location: [facility] OR;  Service: Open Heart Surgery;  Laterality: N/A;   TONSILLECTOMY  1980    Current Medications: Current Meds  Medication Sig   acetaminophen  (TYLENOL ) 325 MG tablet Take 650 mg by mouth every 6 (six) hours as needed for mild pain.   albuterol  (VENTOLIN  HFA) 108 (90 Base) MCG/ACT inhaler Inhale 2 puffs into the lungs every  6 (six) hours as needed for wheezing or shortness of breath.   aspirin  81 MG tablet Take 1 tablet (81 mg total) by mouth daily.   ezetimibe  (ZETIA ) 10 MG tablet TAKE 1 TABLET BY MOUTH EVERY DAY   Insulin  Glargine (BASAGLAR  KWIKPEN) 100 UNIT/ML Inject 30-34 Units into the skin in the morning and at bedtime.   metoprolol  tartrate (LOPRESSOR ) 25 MG tablet TAKE 1 TABLET BY MOUTH TWICE A DAY   Multiple  Vitamins-Minerals (CENTRUM SILVER 50+MEN PO) Take 1 tablet by mouth daily.   omeprazole (PRILOSEC) 20 MG capsule Take 20 mg by mouth daily.   rosuvastatin  (CRESTOR ) 40 MG tablet TAKE 1 TABLET BY MOUTH EVERY DAY   vitamin C (ASCORBIC ACID ) 500 MG tablet Take 1,000 mg by mouth daily.     Allergies:   Ciprofloxacin, Ciprofloxacin hcl, Codeine, Insulin  glargine, and Metformin  and related   Family History: The patient's family history includes Alcohol abuse in his father; Anxiety disorder in his mother; Depression in his father. ROS:   Please see the history of present illness.     All other systems reviewed and are negative.  EKGs/Labs/Other Studies Reviewed:    Cardiac catheterization 01/17/2022 Critical distal LMCA stenosis with 90% eccentric narrowing and TIMI-1 flow in the distal LAD (also supplied by right-to-left collaterals). Moderate-severe LCx and RCA disease with 60% mid/distal LCx and 70% proximal RCA lesions. Normal left ventricular systolic function (LVEF 55-65%) with mildly elevated filling pressure (LVEDP 20-25 mmHg).   Recommendations: Cardiac surgery consultation for CABG; transfer to 2H for close monitoring pending surgical evaluation. Restart heparin  infusion 2 hours after TR band removal. Aggressive secondary prevention.   Dominance: Right    Comparison(s): No prior Echocardiogram.  Echocardiogram 01/18/2022:   1. Left ventricular ejection fraction, by estimation, is 60 to 65%. Left  ventricular ejection fraction by 3D volume is 60 %. The left ventricle has  normal function. The left ventricle has no regional wall motion  abnormalities. There is mild left  ventricular hypertrophy. Left ventricular diastolic parameters are  consistent with Grade I diastolic dysfunction (impaired relaxation).   2. Right ventricular systolic function is normal. The right ventricular  size is normal.   3. The mitral valve is grossly normal. Trivial mitral valve  regurgitation.    4. The aortic valve is tricuspid. Aortic valve regurgitation is not  visualized.   5. Aortic dilatation noted. There is mild dilatation of the ascending  aorta, measuring 40 mm.   6. The inferior vena cava is dilated in size with >50% respiratory  variability, suggesting right atrial pressure of 8 mmHg.   Comparison(s): No prior Echocardiogram.    EKG:    EKG Interpretation Date/Time:  Wednesday July 24 2024 08:20:36 EDT Ventricular Rate:  54 PR Interval:  176 QRS Duration:  84 QT Interval:  394 QTC Calculation: 373 R Axis:   42  Text Interpretation: Sinus bradycardia When compared with ECG of 26-Jul-2023 02:58, PREVIOUS ECG IS PRESENT Confirmed by Tyvion Edmondson ([id number]) on 07/24/2024 8:26:15 AM         Recent Labs: 07/31/2023: Hemoglobin 12.1; Magnesium  2.0; Platelets 308 09/22/2023: ALT 34; BUN 12; Creat 0.83; Potassium 4.4; Sodium 138  Recent Lipid Panel    Component Value Date/Time   CHOL 199 01/17/2022 0217   TRIG 63 01/17/2022 0217   HDL 47 01/17/2022 0217   CHOLHDL 4.2 01/17/2022 0217   VLDL 13 01/17/2022 0217   LDLCALC 139 (H) 01/17/2022 0217   LDLDIRECT 153.4 01/28/2013 1243  05/29/2023 cholesterol 125, HDL 47, LDL 63, triglycerides 71  90/87/7975 hemoglobin A1c 10.2%  Physical Exam:    VS:  BP 108/60 (BP Location: Left Arm, Patient Position: Sitting, Cuff Size: Large)   Pulse 62   Ht 5' 7 (1.702 m)   Wt 230 lb 1.6 oz (104.4 kg)   SpO2 96%   BMI 36.04 kg/m     Wt Readings from Last 3 Encounters:  07/24/24 230 lb 1.6 oz (104.4 kg)  09/13/23 226 lb (102.5 kg)  08/10/23 213 lb (96.6 kg)      General: Alert, oriented x3, no distress, severely obese Head: no evidence of trauma, PERRL, EOMI, no exophtalmos or lid lag, no myxedema, no xanthelasma; normal ears, nose and oropharynx Neck: normal jugular venous pulsations and no hepatojugular reflux; brisk carotid pulses without delay and no carotid bruits Chest: clear to auscultation, no signs of  consolidation by percussion or palpation, normal fremitus, symmetrical and full respiratory excursions Cardiovascular: normal position and quality of the apical impulse, regular rhythm, normal first and second heart sounds, no murmurs, rubs or gallops Abdomen: no tenderness or distention, no masses by palpation, no abnormal pulsatility or arterial bruits, normal bowel sounds, no hepatosplenomegaly Extremities: no clubbing, cyanosis or edema; 2+ radial, ulnar and brachial pulses bilaterally; 2+ right femoral, posterior tibial and dorsalis pedis pulses; 2+ left femoral, posterior tibial and dorsalis pedis pulses; no subclavian or femoral bruits Neurological: grossly nonfocal Psych: Normal mood and affect     ASSESSMENT:    1. Coronary artery disease involving coronary bypass graft of native heart without angina pectoris   2. Hypercholesterolemia   3. Uncontrolled type 2 diabetes mellitus with hyperglycemia (HCC)   4. Severe obesity (BMI 35.0-39.9) with comorbidity (HCC)       PLAN:    In order of problems listed above:  CAD: Asymptomatic.  He has done quite well following bypass surgery.  Discussed the need to protect his bypass grafts in the long run. HLP: All lipid parameters in target range.  Continue statin plus ezetimibe . DM: Glycemic control remains poor and his diabetes has been treated with insulin  only.  Ideally would transition from insulin  to metformin  plus SGLT2 inhibitor and/or GLP-1 agonist.  This would be associated with a improving long-term cardiovascular outlook.  It would also help with weight loss.  We spent a long time discussing the mechanism of action of GLP-1 agonists and SGLT2 inhibitors.  Tried to alleviate his concerns about the potential side effects while pointing out that serious side effects can occur and how they could be managed.  Encouraged him to start taking the Farxiga that was provided by his primary care provider.  Discussed the potential risks of groin  yeast infections, urinary tract infections and the very low risk of Fournier's gangrene. Obesity: When he was young and fit he weighed about 195 pounds, that is a great long-term target, would set a shorter term goal of decreasing weight to 200 pounds over the next 12 months, by decreasing the intake of simple carbohydrates and starches with high glycemic index and saturated fat and increasing the intake of unsaturated fat, lean protein, following a mostly plant-based diet.  Needs to increase his activity level.    Medication Adjustments/Labs and Tests Ordered: Current medicines are reviewed at length with the patient today.  Concerns regarding medicines are outlined above.  Orders Placed This Encounter  Procedures   EKG 12-Lead   No orders of the defined types were placed in this encounter.  Signed, Jerel Balding, MD  07/24/2024 11:12 AM    [facility]

## 2024-07-24 NOTE — Patient Instructions (Signed)
 Medication Instructions:  No medication changes were made at this visit. Continue current regimen.   *If you need a refill on your cardiac medications before your next appointment, please call your pharmacy*  Lab Work: None ordered today. If you have labs (blood work) drawn today and your tests are completely normal, you will receive your results only by: MyChart Message (if you have MyChart) OR A paper copy in the mail If you have any lab test that is abnormal or we need to change your treatment, we will call you to review the results.  Testing/Procedures: None ordered today.  Follow-Up: At [facility], you and your health needs are our priority.  As part of our continuing mission to provide you with exceptional heart care, our providers are all part of one team.  This team includes your primary Cardiologist (physician) and Advanced Practice Providers or APPs (Physician Assistants and Nurse Practitioners) who all work together to provide you with the care you need, when you need it.  Your next appointment:   1 year(s)  Provider:   Jerel Balding, MD

## 2024-08-03 ENCOUNTER — Other Ambulatory Visit: Payer: Self-pay | Admitting: Cardiovascular Disease

## 2024-08-03 DIAGNOSIS — E7800 Pure hypercholesterolemia, unspecified: Secondary | ICD-10-CM

## 2024-08-30 DIAGNOSIS — E1165 Type 2 diabetes mellitus with hyperglycemia: Secondary | ICD-10-CM | POA: Diagnosis not present

## 2024-09-05 ENCOUNTER — Other Ambulatory Visit: Payer: Self-pay | Admitting: Cardiovascular Disease

## 2024-09-08 ENCOUNTER — Ambulatory Visit
Admission: EM | Admit: 2024-09-08 | Discharge: 2024-09-08 | Disposition: A | Discharge status: Home / Self Care | Attending: Nurse Practitioner | Admitting: Nurse Practitioner

## 2024-09-08 DIAGNOSIS — R3989 Other symptoms and signs involving the genitourinary system: Secondary | ICD-10-CM

## 2024-09-08 DIAGNOSIS — R300 Dysuria: Secondary | ICD-10-CM

## 2024-09-08 LAB — POCT URINE DIPSTICK
Bilirubin, UA: NEGATIVE
Blood, UA: NEGATIVE
Glucose, UA: 250 mg/dL — AB
Ketones, POC UA: NEGATIVE mg/dL
Leukocytes, UA: NEGATIVE
Nitrite, UA: NEGATIVE
POC PROTEIN,UA: NEGATIVE
Spec Grav, UA: 1.015 (ref 1.010–1.025)
Urobilinogen, UA: 0.2 U/dL
pH, UA: 5.5 (ref 5.0–8.0)

## 2024-09-08 MED ORDER — PHENAZOPYRIDINE HCL 100 MG PO TABS: 100.0000 mg | ORAL_TABLET | Freq: Three times a day (TID) | ORAL | 0 refills | Status: AC | PRN

## 2024-09-08 NOTE — ED Triage Notes (Signed)
 Pt reports he has bladder pain, fatigue, and burning with urination x 4 weeks.

## 2024-09-08 NOTE — ED Provider Notes (Signed)
 [facility]    CSN: [id number] Arrival date & time: 09/08/24  0815      History   Chief Complaint No chief complaint on file.   HPI Jacob Acosta is a 63 y.o. male.   The history is provided by the patient.   Patient presents with a 4-week history of pain with urination, bladder pain, and fatigue.  Patient denies fever, chills, hematuria, urinary frequency, urgency, hesitancy, decreased urine stream, trouble starting or stopping his urine stream, flank pain, or low back pain.  Patient states that he has not been told that he has issues with his prostate, states that the last time his prostate was checked everything was fine.  He further denies history of kidney stones.  Patient states I have a urinary tract infection about every other year.  Patient states that he is diabetic.  States he has been drinking cranberry juice and increasing his water  intake.  Past Medical History:  Diagnosis Date   Anxiety    Diabetes mellitus without complication (HCC)    High cholesterol     Patient Active Problem List   Diagnosis Date Noted   Acute cholecystitis 07/28/2023   Anxiety 07/27/2023   GERD (gastroesophageal reflux disease) 07/27/2023   Ileus (HCC) 07/27/2023   Acute gangrenous cholecystitis 07/26/2023   Severe obesity (BMI 35.0-39.9) with comorbidity (HCC) 03/16/2022   Type II diabetes mellitus with complication (HCC) 03/16/2022   Hypercholesterolemia 03/16/2022   Coronary artery disease 01/19/2022   NSTEMI (non-ST elevated myocardial infarction) (HCC) 01/17/2022   ACS (acute coronary syndrome) (HCC) 01/16/2022   Personal history of noncompliance with medical treatment, presenting hazards to health 12/13/2017   Uncontrolled type 2 diabetes mellitus with hyperglycemia (HCC) 08/30/2017   Mixed hyperlipidemia 08/30/2017   Physical exam, annual 01/28/2013   Overweight 01/28/2013   History of nephrolithiasis 01/28/2013   Tendonitis of ankle or foot 01/28/2013     Past Surgical History:  Procedure Laterality Date   CHOLECYSTECTOMY  08/2023   CORONARY ARTERY BYPASS GRAFT N/A 01/19/2022   Procedure: CORONARY ARTERY BYPASS GRAFTING (CABG), ON PUMP, TIMES THREE USING LEFT INTERNAL MAMMARY ARTERY AND RIGHT ENDOSCOPICALLY HARVESTED GREATER SAPHENOUS VEIN;  Surgeon: Shyrl Linnie KIDD, MD;  Location: [facility] OR;  Service: Open Heart Surgery;  Laterality: N/A;  left radial artery harvest   dental sugery     at age 89   LEFT HEART CATH AND CORONARY ANGIOGRAPHY N/A 01/17/2022   Procedure: LEFT HEART CATH AND CORONARY ANGIOGRAPHY;  Surgeon: Mady Bruckner, MD;  Location: [facility] INVASIVE CV LAB;  Service: Cardiovascular;  Laterality: N/A;   RADIAL ARTERY HARVEST Left 01/19/2022   Procedure: RADIAL ARTERY HARVEST;  Surgeon: Shyrl Linnie KIDD, MD;  Location: [facility] OR;  Service: Open Heart Surgery;  Laterality: Left;   TEE WITHOUT CARDIOVERSION N/A 01/19/2022   Procedure: TRANSESOPHAGEAL ECHOCARDIOGRAM (TEE);  Surgeon: Shyrl Linnie KIDD, MD;  Location: [facility] OR;  Service: Open Heart Surgery;  Laterality: N/A;   TONSILLECTOMY  11/14/1978       Home Medications    Prior to Admission medications   Medication Sig Start Date End Date Taking? Authorizing Provider  phenazopyridine  (PYRIDIUM ) 100 MG tablet Take 1 tablet (100 mg total) by mouth 3 (three) times daily as needed for pain. 09/08/24  Yes Leath-Warren, Etta PARAS, NP  acetaminophen  (TYLENOL ) 325 MG tablet Take 650 mg by mouth every 6 (six) hours as needed for mild pain.    [provider]  albuterol  (VENTOLIN  HFA) 108 (90 Base) MCG/ACT inhaler  Inhale 2 puffs into the lungs every 6 (six) hours as needed for wheezing or shortness of breath. 09/04/23   Leath-Warren, Etta PARAS, NP  aspirin  81 MG tablet Take 1 tablet (81 mg total) by mouth daily. 01/23/22   Barrett, Erin R, PA-C  docusate sodium  (COLACE) 100 MG capsule Take 1 capsule (100 mg total) by mouth 2 (two) times daily as needed for mild  constipation. Patient not taking: Reported on 07/24/2024 07/31/23   Caleen Burgess BROCKS, MD  ezetimibe  (ZETIA ) 10 MG tablet TAKE 1 TABLET BY MOUTH EVERY DAY 08/05/24   Croitoru, Jerel, MD  gabapentin  (NEURONTIN ) 100 MG capsule Take 1 capsule (100 mg total) by mouth 3 (three) times daily for 7 days. Patient not taking: Reported on 07/24/2024 09/04/23 09/11/23  Leath-Warren, Etta PARAS, NP  Insulin  Glargine (BASAGLAR  [facility]) 100 UNIT/ML Inject 30-34 Units into the skin in the morning and at bedtime.    [provider]  metoprolol  tartrate (LOPRESSOR ) 25 MG tablet TAKE 1 TABLET BY MOUTH TWICE A DAY 06/11/24   Croitoru, Mihai, MD  Multiple Vitamins-Minerals (CENTRUM SILVER 50+MEN PO) Take 1 tablet by mouth daily.    [provider]  mupirocin  ointment (BACTROBAN ) 2 % Apply 1 Application topically 2 (two) times daily. Patient not taking: Reported on 07/24/2024 09/04/23   Leath-Warren, Etta PARAS, NP  omeprazole (PRILOSEC) 20 MG capsule Take 20 mg by mouth daily.    [provider]  ondansetron  (ZOFRAN ) 4 MG tablet Take 1 tablet (4 mg total) by mouth every 8 (eight) hours as needed for nausea or vomiting. Patient not taking: Reported on 07/24/2024 12/03/22   Avegno, Komlanvi S, FNP  rosuvastatin  (CRESTOR ) 40 MG tablet TAKE 1 TABLET BY MOUTH EVERY DAY 05/06/24   Croitoru, Jerel, MD  sucralfate  (CARAFATE ) 1 g tablet Take 1 tablet (1 g total) by mouth 4 (four) times daily as needed. Patient not taking: Reported on 07/24/2024 07/26/23   Theadore Ozell HERO, MD  valACYclovir  (VALTREX ) 1000 MG tablet Take 1 tablet (1,000 mg total) by mouth 3 (three) times daily. Patient not taking: Reported on 07/24/2024 09/04/23   Leath-Warren, Etta PARAS, NP  vitamin C (ASCORBIC ACID ) 500 MG tablet Take 1,000 mg by mouth daily.    [provider]    Family History Family History  Problem Relation Age of Onset   Anxiety disorder Mother    Depression Father    Alcohol abuse Father     Social  History Social History   Tobacco Use   Smoking status: Former    Types: Cigarettes   Smokeless tobacco: Never   Tobacco comments:    only smoked occasionally  Vaping Use   Vaping status: Never Used  Substance Use Topics   Alcohol use: No   Drug use: No     Allergies   Ciprofloxacin, Ciprofloxacin hcl, Codeine, Insulin  glargine, and Metformin  and related   Review of Systems Review of Systems Per HPI  Physical Exam Triage Vital Signs ED Triage Vitals  Encounter Vitals Group     BP 09/08/24 0825 122/64     Girls Systolic BP Percentile --      Girls Diastolic BP Percentile --      Boys Systolic BP Percentile --      Boys Diastolic BP Percentile --      Pulse Rate 09/08/24 0825 63     Resp 09/08/24 0825 16     Temp 09/08/24 0825 97.7 F (36.5 C)     Temp Source 09/08/24  0825 Oral     SpO2 09/08/24 0825 97 %     Weight --      Height --      Head Circumference --      Peak Flow --      Pain Score 09/08/24 0826 0     Pain Loc --      Pain Education --      Exclude from Growth Chart --    No data found.  Updated Vital Signs BP 122/64 (BP Location: Right Arm)   Pulse 63   Temp 97.7 F (36.5 C) (Oral)   Resp 16   SpO2 97%   Visual Acuity Right Eye Distance:   Left Eye Distance:   Bilateral Distance:    Right Eye Near:   Left Eye Near:    Bilateral Near:     Physical Exam Vitals and nursing note reviewed.  Constitutional:      General: He is not in acute distress.    Appearance: Normal appearance.  HENT:     Head: Normocephalic.  Eyes:     Extraocular Movements: Extraocular movements intact.     Pupils: Pupils are equal, round, and reactive to light.  Cardiovascular:     Rate and Rhythm: Normal rate and regular rhythm.     Pulses: Normal pulses.     Heart sounds: Normal heart sounds.  Pulmonary:     Effort: Pulmonary effort is normal. No respiratory distress.     Breath sounds: Normal breath sounds. No stridor. No wheezing, rhonchi or rales.   Abdominal:     General: Bowel sounds are normal.     Palpations: Abdomen is soft.     Tenderness: There is no abdominal tenderness. There is no right CVA tenderness or left CVA tenderness.  Musculoskeletal:     Cervical back: Normal range of motion.  Skin:    General: Skin is warm and dry.  Neurological:     General: No focal deficit present.     Mental Status: He is alert and oriented to person, place, and time.  Psychiatric:        Mood and Affect: Mood normal.        Behavior: Behavior normal.      UC Treatments / Results  Labs (all labs ordered are listed, but only abnormal results are displayed) Labs Reviewed  POCT URINE DIPSTICK - Abnormal; Notable for the following components:      Result Value   Glucose, UA =250 (*)    All other components within normal limits  URINE CULTURE    EKG   Radiology No results found.  Procedures Procedures (including critical care time)  Medications Ordered in UC Medications - No data to display  Initial Impression / Assessment and Plan / UC Course  I have reviewed the triage vital signs and the nursing notes.  Pertinent labs & imaging results that were available during my care of the patient were reviewed by me and considered in my medical decision making (see chart for details).  Patient with a several week history of pain with urination and bladder pain.  He denies fever, chills, abdominal pain, low back pain, or flank pain.  On exam, the patient's lung sounds are clear throughout, room air sats are at 97%.  He does not exhibit any CVA tenderness or lower abdominal pain.  Urinalysis was negative for infection.  Will send for culture to ensure that urinalysis is accurate.  In the interim, we will start patient  on Pyridium  100 mg for pain with urination.  Supportive care recommendations were provided discussed with the patient to include increasing his fluids, preferably water , developing a toileting schedule, and to avoid caffeine.   Patient states that he is scheduled to follow-up with his PCP this week.  Patient advised to notify PCP of his ongoing symptoms.  Patient was in agreement with this plan of care and verbalizes understanding.  All questions were answered.  Patient stable for discharge.  Final Clinical Impressions(s) / UC Diagnoses   Final diagnoses:  Dysuria     Discharge Instructions      I have decided to order a urine culture.  You will be contacted if the urine culture result is abnormal.  You will also have access to the results via MyChart. Take medication as prescribed. You may take over-the-counter Tylenol  as needed for pain, fever, or general discomfort. Continue to drink plenty of fluids.  Try to drink at least 8-10 8 ounce glasses of water  daily while symptoms persist. Develop a toileting schedule that will allow you to urinate at least every 2 hours. Avoid caffeine such as tea, soda, or coffee while symptoms persist. As discussed, please follow-up with your PCP as scheduled.  Please notify them of the symptoms that you are currently having to determine if referral to urology is needed. Follow-up as needed.     ED Prescriptions     Medication Sig Dispense Auth. Provider   phenazopyridine  (PYRIDIUM ) 100 MG tablet Take 1 tablet (100 mg total) by mouth 3 (three) times daily as needed for pain. 10 tablet Leath-Warren, Etta PARAS, NP      PDMP not reviewed this encounter.   Gilmer Etta PARAS, NP 09/08/24 [phone]

## 2024-09-08 NOTE — Discharge Instructions (Signed)
 I have decided to order a urine culture.  You will be contacted if the urine culture result is abnormal.  You will also have access to the results via MyChart. Take medication as prescribed. You may take over-the-counter Tylenol  as needed for pain, fever, or general discomfort. Continue to drink plenty of fluids.  Try to drink at least 8-10 8 ounce glasses of water  daily while symptoms persist. Develop a toileting schedule that will allow you to urinate at least every 2 hours. Avoid caffeine such as tea, soda, or coffee while symptoms persist. As discussed, please follow-up with your PCP as scheduled.  Please notify them of the symptoms that you are currently having to determine if referral to urology is needed. Follow-up as needed.

## 2024-09-09 ENCOUNTER — Ambulatory Visit (HOSPITAL_COMMUNITY): Payer: Self-pay

## 2024-09-09 ENCOUNTER — Other Ambulatory Visit (HOSPITAL_BASED_OUTPATIENT_CLINIC_OR_DEPARTMENT_OTHER): Payer: Self-pay | Admitting: *Deleted

## 2024-09-09 LAB — URINE CULTURE: Culture: NO GROWTH

## 2024-09-09 MED ORDER — METOPROLOL TARTRATE 25 MG PO TABS: 25.0000 mg | ORAL_TABLET | Freq: Two times a day (BID) | ORAL | 3 refills | Status: AC

## 2024-09-09 NOTE — Telephone Encounter (Signed)
 Refilled Metoprolol as requested

## 2024-09-13 DIAGNOSIS — N410 Acute prostatitis: Secondary | ICD-10-CM | POA: Diagnosis not present

## 2024-11-25 ENCOUNTER — Ambulatory Visit
Admission: EM | Admit: 2024-11-25 | Discharge: 2024-11-25 | Disposition: A | Discharge status: Home / Self Care | Attending: Nurse Practitioner | Admitting: Nurse Practitioner

## 2024-11-25 DIAGNOSIS — J069 Acute upper respiratory infection, unspecified: Secondary | ICD-10-CM

## 2024-11-25 MED ORDER — BENZONATATE 100 MG PO CAPS: 100.0000 mg | ORAL_CAPSULE | Freq: Three times a day (TID) | ORAL | 0 refills | Status: AC | PRN

## 2024-11-25 NOTE — ED Triage Notes (Signed)
 Pt reports sinus congestion, cough, sore scratchy throat, headache, nausea x 4 days. Denies fever. Pt has declined COVID and flu testing during triage.

## 2024-11-25 NOTE — Discharge Instructions (Addendum)
 You have a viral upper respiratory infection.  Symptoms should improve over the next week to 10 days.  If you develop chest pain or shortness of breath, go to the emergency room.  Some things that can make you feel better are: - Increased rest - Increasing fluid with water/sugar free electrolytes - Acetaminophen  as needed for fever/pain - Salt water gargling, chloraseptic spray and throat lozenges - OTC guaifenesin (Mucinex) 600 mg twice daily for congestion - Saline sinus flushes or a neti pot - Humidifying the air -Tessalon  Perles every 8 hours as needed for dry cough

## 2024-11-25 NOTE — ED Provider Notes (Signed)
 " [facility]    CSN: [id number] Arrival date & time: 11/25/24  1244      History   Chief Complaint No chief complaint on file.   HPI Jacob Acosta is a 64 y.o. male.   Patient presents today with 4-day history of congested cough, shortness of breath with coughing, stuffy nose, postnasal drainage, scratchy throat, bilateral ears feeling clogged, decreased appetite, and fatigue.  No fever, body aches or chills, chest pain, sinus pressure or headache, ear pain, abdominal pain, nausea/vomiting, or diarrhea.  No known sick contacts.  Has been taking aspirin  and took Keflex  from a long time ago without much improvement.    Past Medical History:  Diagnosis Date   Anxiety    Diabetes mellitus without complication (HCC)    High cholesterol     Patient Active Problem List   Diagnosis Date Noted   Acute cholecystitis 07/28/2023   Anxiety 07/27/2023   GERD (gastroesophageal reflux disease) 07/27/2023   Ileus (HCC) 07/27/2023   Acute gangrenous cholecystitis 07/26/2023   Severe obesity (BMI 35.0-39.9) with comorbidity (HCC) 03/16/2022   Type II diabetes mellitus with complication (HCC) 03/16/2022   Hypercholesterolemia 03/16/2022   Coronary artery disease 01/19/2022   NSTEMI (non-ST elevated myocardial infarction) (HCC) 01/17/2022   ACS (acute coronary syndrome) (HCC) 01/16/2022   Personal history of noncompliance with medical treatment, presenting hazards to health 12/13/2017   Uncontrolled type 2 diabetes mellitus with hyperglycemia (HCC) 08/30/2017   Mixed hyperlipidemia 08/30/2017   Physical exam, annual 01/28/2013   Overweight 01/28/2013   History of nephrolithiasis 01/28/2013   Tendonitis of ankle or foot 01/28/2013    Past Surgical History:  Procedure Laterality Date   CHOLECYSTECTOMY  08/2023   CORONARY ARTERY BYPASS GRAFT N/A 01/19/2022   Procedure: CORONARY ARTERY BYPASS GRAFTING (CABG), ON PUMP, TIMES THREE USING LEFT INTERNAL MAMMARY ARTERY AND RIGHT  ENDOSCOPICALLY HARVESTED GREATER SAPHENOUS VEIN;  Surgeon: Shyrl Linnie KIDD, MD;  Location: [facility] OR;  Service: Open Heart Surgery;  Laterality: N/A;  left radial artery harvest   dental sugery     at age 35   LEFT HEART CATH AND CORONARY ANGIOGRAPHY N/A 01/17/2022   Procedure: LEFT HEART CATH AND CORONARY ANGIOGRAPHY;  Surgeon: Mady Bruckner, MD;  Location: [facility];  Service: Cardiovascular;  Laterality: N/A;   RADIAL ARTERY HARVEST Left 01/19/2022   Procedure: RADIAL ARTERY HARVEST;  Surgeon: Shyrl Linnie KIDD, MD;  Location: [facility] OR;  Service: Open Heart Surgery;  Laterality: Left;   TEE WITHOUT CARDIOVERSION N/A 01/19/2022   Procedure: TRANSESOPHAGEAL ECHOCARDIOGRAM (TEE);  Surgeon: Shyrl Linnie KIDD, MD;  Location: [facility] OR;  Service: Open Heart Surgery;  Laterality: N/A;   TONSILLECTOMY  11/14/1978       Home Medications    Prior to Admission medications  Medication Sig Start Date End Date Taking? Authorizing Provider  benzonatate  (TESSALON ) 100 MG capsule Take 1 capsule (100 mg total) by mouth 3 (three) times daily as needed for cough. Do not take with alcohol or while operating or driving heavy machinery 8/87/73  Yes Chandra Raisin A, NP  acetaminophen  (TYLENOL ) 325 MG tablet Take 650 mg by mouth every 6 (six) hours as needed for mild pain.    [provider]  albuterol  (VENTOLIN  HFA) 108 (90 Base) MCG/ACT inhaler Inhale 2 puffs into the lungs every 6 (six) hours as needed for wheezing or shortness of breath. 09/04/23   Leath-Warren, Etta PARAS, NP  aspirin  81 MG tablet Take 1 tablet (81 mg  total) by mouth daily. 01/23/22   Barrett, Erin R, PA-C  docusate sodium  (COLACE) 100 MG capsule Take 1 capsule (100 mg total) by mouth 2 (two) times daily as needed for mild constipation. Patient not taking: Reported on 07/24/2024 07/31/23   Caleen Burgess BROCKS, MD  ezetimibe  (ZETIA ) 10 MG tablet TAKE 1 TABLET BY MOUTH EVERY DAY 08/05/24   Croitoru, Jerel, MD  gabapentin   (NEURONTIN ) 100 MG capsule Take 1 capsule (100 mg total) by mouth 3 (three) times daily for 7 days. Patient not taking: Reported on 07/24/2024 09/04/23 09/11/23  Leath-Warren, Etta PARAS, NP  Insulin  Glargine (BASAGLAR  KWIKPEN) 100 UNIT/ML Inject 30-34 Units into the skin in the morning and at bedtime.    [provider]  metoprolol  tartrate (LOPRESSOR ) 25 MG tablet Take 1 tablet (25 mg total) by mouth 2 (two) times daily. 09/09/24   Croitoru, Mihai, MD  Multiple Vitamins-Minerals (CENTRUM SILVER 50+MEN PO) Take 1 tablet by mouth daily.    [provider]  mupirocin  ointment (BACTROBAN ) 2 % Apply 1 Application topically 2 (two) times daily. Patient not taking: Reported on 07/24/2024 09/04/23   Leath-Warren, Etta PARAS, NP  omeprazole (PRILOSEC) 20 MG capsule Take 20 mg by mouth daily.    [provider]  ondansetron  (ZOFRAN ) 4 MG tablet Take 1 tablet (4 mg total) by mouth every 8 (eight) hours as needed for nausea or vomiting. Patient not taking: Reported on 07/24/2024 12/03/22   Avegno, Komlanvi S, FNP  phenazopyridine  (PYRIDIUM ) 100 MG tablet Take 1 tablet (100 mg total) by mouth 3 (three) times daily as needed for pain. 09/08/24   Leath-Warren, Etta PARAS, NP  rosuvastatin  (CRESTOR ) 40 MG tablet TAKE 1 TABLET BY MOUTH EVERY DAY 05/06/24   Croitoru, Jerel, MD  sucralfate  (CARAFATE ) 1 g tablet Take 1 tablet (1 g total) by mouth 4 (four) times daily as needed. Patient not taking: Reported on 07/24/2024 07/26/23   Theadore Ozell HERO, MD  valACYclovir  (VALTREX ) 1000 MG tablet Take 1 tablet (1,000 mg total) by mouth 3 (three) times daily. Patient not taking: Reported on 07/24/2024 09/04/23   Leath-Warren, Etta PARAS, NP  vitamin C (ASCORBIC ACID ) 500 MG tablet Take 1,000 mg by mouth daily.    [provider]    Family History Family History  Problem Relation Age of Onset   Anxiety disorder Mother    Depression Father    Alcohol abuse Father     Social History Social  History[1]   Allergies   Ciprofloxacin, Ciprofloxacin hcl, Codeine, Insulin  glargine, Metformin  and related, and Penicillins   Review of Systems Review of Systems Per HPI  Physical Exam Triage Vital Signs ED Triage Vitals  Encounter Vitals Group     BP 11/25/24 1327 121/74     Girls Systolic BP Percentile --      Girls Diastolic BP Percentile --      Boys Systolic BP Percentile --      Boys Diastolic BP Percentile --      Pulse Rate 11/25/24 1327 94     Resp 11/25/24 1327 20     Temp 11/25/24 1327 97.8 F (36.6 C)     Temp Source 11/25/24 1327 Oral     SpO2 11/25/24 1327 94 %     Weight --      Height --      Head Circumference --      Peak Flow --      Pain Score 11/25/24 1328 0     Pain  Loc --      Pain Education --      Exclude from Growth Chart --    No data found.  Updated Vital Signs BP 121/74 (BP Location: Right Arm)   Pulse 94   Temp 97.8 F (36.6 C) (Oral)   Resp 20   SpO2 94%   Visual Acuity Right Eye Distance:   Left Eye Distance:   Bilateral Distance:    Right Eye Near:   Left Eye Near:    Bilateral Near:     Physical Exam Vitals and nursing note reviewed.  Constitutional:      General: He is not in acute distress.    Appearance: Normal appearance. He is not ill-appearing or toxic-appearing.  HENT:     Head: Normocephalic and atraumatic.     Right Ear: Tympanic membrane, ear canal and external ear normal.     Left Ear: Tympanic membrane, ear canal and external ear normal.     Nose: Congestion present. No rhinorrhea.     Mouth/Throat:     Mouth: Mucous membranes are moist.     Pharynx: Oropharynx is clear. Postnasal drip present. No oropharyngeal exudate or posterior oropharyngeal erythema.  Eyes:     General: No scleral icterus.    Extraocular Movements: Extraocular movements intact.  Cardiovascular:     Rate and Rhythm: Normal rate and regular rhythm.  Pulmonary:     Effort: Pulmonary effort is normal. No respiratory distress.      Breath sounds: Normal breath sounds. No wheezing, rhonchi or rales.  Musculoskeletal:     Cervical back: Normal range of motion and neck supple.  Lymphadenopathy:     Cervical: No cervical adenopathy.  Skin:    General: Skin is warm and dry.     Coloration: Skin is not jaundiced or pale.     Findings: No erythema or rash.  Neurological:     Mental Status: He is alert and oriented to person, place, and time.  Psychiatric:        Behavior: Behavior is cooperative.      UC Treatments / Results  Labs (all labs ordered are listed, but only abnormal results are displayed) Labs Reviewed - No data to display  EKG   Radiology No results found.  Procedures Procedures (including critical care time)  Medications Ordered in UC Medications - No data to display  Initial Impression / Assessment and Plan / UC Course  I have reviewed the triage vital signs and the nursing notes.  Pertinent labs & imaging results that were available during my care of the patient were reviewed by me and considered in my medical decision making (see chart for details).   Patient is a pleasant, well-appearing 64 year old male presenting today for cough, congestion, scratchy throat.  Vital signs are stable and exam is reassuring today.  Patient declines COVID-19 and influenza testing.  Suspect viral etiology.  Supportive care discussed, start cough suppressant medication, guaifenesin , nasal saline rinses.  Return and ER precautions discussed.  The patient was given the opportunity to ask questions.  All questions answered to their satisfaction.  The patient is in agreement to this plan.   Final Clinical Impressions(s) / UC Diagnoses   Final diagnoses:  Viral URI with cough     Discharge Instructions      You have a viral upper respiratory infection.  Symptoms should improve over the next week to 10 days.  If you develop chest pain or shortness of breath, go to the emergency  room.  Some things that can  make you feel better are: - Increased rest - Increasing fluid with water /sugar free electrolytes - Acetaminophen  as needed for fever/pain - Salt water  gargling, chloraseptic spray and throat lozenges - OTC guaifenesin  (Mucinex ) 600 mg twice daily for congestion - Saline sinus flushes or a neti pot - Humidifying the air -Tessalon  Perles every 8 hours as needed for dry cough      ED Prescriptions     Medication Sig Dispense Auth. Provider   benzonatate  (TESSALON ) 100 MG capsule Take 1 capsule (100 mg total) by mouth 3 (three) times daily as needed for cough. Do not take with alcohol or while operating or driving heavy machinery 21 capsule Chandra Harlene LABOR, NP      PDMP not reviewed this encounter.     [1]  Social History Tobacco Use   Smoking status: Former    Types: Cigarettes   Smokeless tobacco: Never   Tobacco comments:    only smoked occasionally  Vaping Use   Vaping status: Never Used  Substance Use Topics   Alcohol use: No   Drug use: No     Chandra Harlene LABOR, NP 11/25/24 1437  "

## 2024-11-26 ENCOUNTER — Ambulatory Visit: Payer: Self-pay
